# Patient Record
Sex: Male | Born: 1945
Health system: Southern US, Community
[De-identification: ages and names within clinical notes are randomized; demographics above are authoritative.]

## PROBLEM LIST (undated history)

## (undated) DIAGNOSIS — N200 Calculus of kidney: Secondary | ICD-10-CM

## (undated) DIAGNOSIS — M25539 Pain in unspecified wrist: Secondary | ICD-10-CM

## (undated) DIAGNOSIS — R972 Elevated prostate specific antigen [PSA]: Secondary | ICD-10-CM

## (undated) DIAGNOSIS — H269 Unspecified cataract: Secondary | ICD-10-CM

## (undated) DIAGNOSIS — I499 Cardiac arrhythmia, unspecified: Secondary | ICD-10-CM

## (undated) DIAGNOSIS — G4733 Obstructive sleep apnea (adult) (pediatric): Secondary | ICD-10-CM

## (undated) DIAGNOSIS — C61 Malignant neoplasm of prostate: Secondary | ICD-10-CM

## (undated) DIAGNOSIS — M25569 Pain in unspecified knee: Secondary | ICD-10-CM

## (undated) DIAGNOSIS — T7840XA Allergy, unspecified, initial encounter: Secondary | ICD-10-CM

## (undated) DIAGNOSIS — M109 Gout, unspecified: Secondary | ICD-10-CM

## (undated) DIAGNOSIS — Z9989 Dependence on other enabling machines and devices: Secondary | ICD-10-CM

## (undated) DIAGNOSIS — G4730 Sleep apnea, unspecified: Secondary | ICD-10-CM

## (undated) DIAGNOSIS — R634 Abnormal weight loss: Secondary | ICD-10-CM

## (undated) DIAGNOSIS — I491 Atrial premature depolarization: Secondary | ICD-10-CM

## (undated) DIAGNOSIS — N529 Male erectile dysfunction, unspecified: Secondary | ICD-10-CM

## (undated) DIAGNOSIS — K219 Gastro-esophageal reflux disease without esophagitis: Secondary | ICD-10-CM

## (undated) DIAGNOSIS — M1990 Unspecified osteoarthritis, unspecified site: Secondary | ICD-10-CM

## (undated) DIAGNOSIS — R6889 Other general symptoms and signs: Secondary | ICD-10-CM

## (undated) DIAGNOSIS — I10 Essential (primary) hypertension: Secondary | ICD-10-CM

## (undated) DIAGNOSIS — T8859XA Other complications of anesthesia, initial encounter: Secondary | ICD-10-CM

## (undated) DIAGNOSIS — T4145XA Adverse effect of unspecified anesthetic, initial encounter: Secondary | ICD-10-CM

## (undated) DIAGNOSIS — E785 Hyperlipidemia, unspecified: Secondary | ICD-10-CM

## (undated) DIAGNOSIS — C801 Malignant (primary) neoplasm, unspecified: Secondary | ICD-10-CM

## (undated) DIAGNOSIS — R918 Other nonspecific abnormal finding of lung field: Secondary | ICD-10-CM

## (undated) HISTORY — DX: Unspecified osteoarthritis, unspecified site: M19.90

## (undated) HISTORY — DX: Unspecified cataract: H26.9

## (undated) HISTORY — PX: CATARACT EXTRACTION, BILATERAL: SHX1313

## (undated) HISTORY — DX: Pain in unspecified knee: M25.569

## (undated) HISTORY — DX: Dependence on other enabling machines and devices: Z99.89

## (undated) HISTORY — DX: Essential (primary) hypertension: I10

## (undated) HISTORY — DX: Obstructive sleep apnea (adult) (pediatric): G47.33

## (undated) HISTORY — PX: MOHS SURGERY: SUR867

## (undated) HISTORY — PX: LIPOMA EXCISION: SHX5283

## (undated) HISTORY — PX: TONSILLECTOMY: SUR1361

## (undated) HISTORY — DX: Sleep apnea, unspecified: G47.30

## (undated) HISTORY — DX: Allergy, unspecified, initial encounter: T78.40XA

## (undated) HISTORY — DX: Atrial premature depolarization: I49.1

## (undated) HISTORY — DX: Hyperlipidemia, unspecified: E78.5

## (undated) HISTORY — DX: Gout, unspecified: M10.9

## (undated) HISTORY — PX: PROSTATE BIOPSY: SHX241

## (undated) HISTORY — DX: Male erectile dysfunction, unspecified: N52.9

---
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
## 1999-02-06 ENCOUNTER — Encounter: Payer: Self-pay | Admitting: Emergency Medicine

## 1999-02-06 ENCOUNTER — Emergency Department (HOSPITAL_COMMUNITY): Admission: EM | Admit: 1999-02-06 | Discharge: 1999-02-06 | Payer: Self-pay | Admitting: Emergency Medicine

## 2010-11-06 ENCOUNTER — Emergency Department (HOSPITAL_COMMUNITY): Admission: EM | Admit: 2010-11-06 | Discharge: 2010-11-06 | Payer: Self-pay | Admitting: Emergency Medicine

## 2010-11-16 ENCOUNTER — Ambulatory Visit (HOSPITAL_COMMUNITY): Admission: RE | Admit: 2010-11-16 | Discharge: 2010-11-16 | Payer: Self-pay | Admitting: Urology

## 2011-03-09 LAB — POCT I-STAT, CHEM 8
BUN: 14 mg/dL (ref 6–23)
Calcium, Ion: 1.16 mmol/L (ref 1.12–1.32)
Chloride: 105 mEq/L (ref 96–112)
Creatinine, Ser: 1.6 mg/dL — ABNORMAL HIGH (ref 0.4–1.5)
Glucose, Bld: 108 mg/dL — ABNORMAL HIGH (ref 70–99)
HCT: 47 % (ref 39.0–52.0)
Hemoglobin: 16 g/dL (ref 13.0–17.0)
Potassium: 4.1 mEq/L (ref 3.5–5.1)
Sodium: 138 mEq/L (ref 135–145)
TCO2: 23 mmol/L (ref 0–100)

## 2011-03-09 LAB — URINALYSIS, ROUTINE W REFLEX MICROSCOPIC
Bilirubin Urine: NEGATIVE
Glucose, UA: NEGATIVE mg/dL
Ketones, ur: NEGATIVE mg/dL
Leukocytes, UA: NEGATIVE
Nitrite: NEGATIVE
Protein, ur: NEGATIVE mg/dL
Specific Gravity, Urine: 1.017 (ref 1.005–1.030)
Urobilinogen, UA: 0.2 mg/dL (ref 0.0–1.0)
pH: 6 (ref 5.0–8.0)

## 2011-03-09 LAB — URINE CULTURE: Culture: NO GROWTH

## 2011-03-09 LAB — URINE MICROSCOPIC-ADD ON

## 2011-10-08 ENCOUNTER — Other Ambulatory Visit: Payer: Self-pay | Admitting: Internal Medicine

## 2011-10-08 DIAGNOSIS — R911 Solitary pulmonary nodule: Secondary | ICD-10-CM

## 2011-10-15 ENCOUNTER — Ambulatory Visit
Admission: RE | Admit: 2011-10-15 | Discharge: 2011-10-15 | Disposition: A | Discharge status: Home / Self Care | Payer: BC Managed Care – PPO | Source: Ambulatory Visit | Attending: Internal Medicine | Admitting: Internal Medicine

## 2011-10-15 DIAGNOSIS — R911 Solitary pulmonary nodule: Secondary | ICD-10-CM

## 2011-10-15 MED ORDER — IOHEXOL 300 MG/ML  SOLN
75.0000 mL | Freq: Once | INTRAMUSCULAR | Status: AC | PRN
  Administered 2011-10-15: 75 mL via INTRAVENOUS

## 2012-04-03 ENCOUNTER — Encounter: Payer: Self-pay | Admitting: *Deleted

## 2012-04-03 DIAGNOSIS — R0789 Other chest pain: Secondary | ICD-10-CM | POA: Insufficient documentation

## 2014-03-22 ENCOUNTER — Encounter: Payer: Self-pay | Admitting: Cardiology

## 2014-04-05 ENCOUNTER — Encounter (INDEPENDENT_AMBULATORY_CARE_PROVIDER_SITE_OTHER): Payer: Self-pay | Admitting: General Surgery

## 2014-04-05 ENCOUNTER — Ambulatory Visit (INDEPENDENT_AMBULATORY_CARE_PROVIDER_SITE_OTHER): Payer: Medicare Other | Admitting: General Surgery

## 2014-04-05 VITALS — BP 160/82 | HR 84 | Temp 97.5°F | Ht 68.0 in | Wt 164.6 lb

## 2014-04-05 DIAGNOSIS — D1779 Benign lipomatous neoplasm of other sites: Secondary | ICD-10-CM

## 2014-04-05 DIAGNOSIS — D171 Benign lipomatous neoplasm of skin and subcutaneous tissue of trunk: Secondary | ICD-10-CM | POA: Insufficient documentation

## 2014-04-05 DIAGNOSIS — L723 Sebaceous cyst: Secondary | ICD-10-CM | POA: Insufficient documentation

## 2014-04-05 NOTE — Addendum Note (Signed)
Addended by: Jeanann Lewandowsky on: 04/05/2014 02:55 PM   Modules accepted: Orders

## 2014-04-05 NOTE — Progress Notes (Signed)
Patient ID: John Potts, male   DOB: April 15, 1946, 68 y.o.   MRN: [id number]  Chief Complaint  Patient presents with  . Lipoma    Note: This dictation was prepared with Dragon/digital dictation along with [employer]. Any transcriptional errors that result from this process are unintentional.  HPI RONDALE Potts is a 68 y.o. male.  He is referred by Dr. Janie Morning at [facility] for evaluation of a soft tissue mass from the right lateral chest wall and the lower back  The patient states that he has had both of these soft tissue masses for 10 years. They have both been enlarging. One of these areas is now as  big as a baseball and is just below the right axilla in the posterior axillary line. A burning discomfort there intermittently. Smaller mass, enlarging, in the lower midline of the back of the lumbar region has never been infected but he would like both of these areas excised.  Morbidities include gout, hyperlipidemia, hypertension, obstructive sleep apnea and asthma.  HPI  Past Medical History  Diagnosis Date  . Hypertension   . Hyperlipidemia   . Chest pain, atypical 05/11/2001    normal stress cardiolite EF 64%    History reviewed. No pertinent past surgical history.  Family History  Problem Relation Age of Onset  . Hypertension Mother   . Hypertension Father     Social History History  Substance Use Topics  . Smoking status: Former Smoker    Quit date: 12/28/2007  . Smokeless tobacco: Not on file  . Alcohol Use: No    No Known Allergies  Current Outpatient Prescriptions  Medication Sig Dispense Refill  . allopurinol (ZYLOPRIM) 300 MG tablet Take 300 mg by mouth daily.      . fenofibrate (TRICOR) 145 MG tablet Take 145 mg by mouth daily.      Marland Kitchen losartan (COZAAR) 100 MG tablet Take 100 mg by mouth daily.      . mometasone (NASONEX) 50 MCG/ACT nasal spray Place 2 sprays into the nose daily.      . Nutritional Supplements (MELATONIN PO)  Take by mouth at bedtime as needed.      . pravastatin (PRAVACHOL) 20 MG tablet Take 20 mg by mouth daily.      . propranolol (INDERAL) 10 MG tablet Take 10 mg by mouth as needed.      . Tamsulosin HCl (FLOMAX) 0.4 MG CAPS Take by mouth daily.      Marland Kitchen loratadine (CLARITIN) 10 MG tablet Take 10 mg by mouth daily.       No current facility-administered medications for this visit.    Review of Systems Review of Systems  Constitutional: Negative for fever, chills and unexpected weight change.  HENT: Negative for congestion, hearing loss, sore throat, trouble swallowing and voice change.   Eyes: Negative for visual disturbance.  Respiratory: Negative for cough and wheezing.   Cardiovascular: Negative for chest pain, palpitations and leg swelling.  Gastrointestinal: Negative for nausea, vomiting, abdominal pain, diarrhea, constipation, blood in stool, abdominal distention, anal bleeding and rectal pain.  Genitourinary: Negative for hematuria and difficulty urinating.  Musculoskeletal: Negative for arthralgias.  Skin: Negative for rash and wound.  Neurological: Negative for seizures, syncope, weakness and headaches.  Hematological: Negative for adenopathy. Does not bruise/bleed easily.  Psychiatric/Behavioral: Negative for confusion.    Blood pressure 160/82, pulse 84, temperature 97.5 F (36.4 C), temperature source Oral, height 5\' 8"  (1.727 m), weight 164 lb 9.6 oz (  74.662 kg).  Physical Exam Physical Exam  Constitutional: He is oriented to person, place, and time. He appears well-developed and well-nourished. No distress.  HENT:  Head: Normocephalic.  Nose: Nose normal.  Mouth/Throat: No oropharyngeal exudate.  Eyes: Conjunctivae and EOM are normal. Pupils are equal, round, and reactive to light. Right eye exhibits no discharge. Left eye exhibits no discharge. No scleral icterus.  Neck: Normal range of motion. Neck supple. No JVD present. No tracheal deviation present. No thyromegaly  present.  Cardiovascular: Normal rate, regular rhythm, normal heart sounds and intact distal pulses.   No murmur heard. Pulmonary/Chest: Effort normal and breath sounds normal. No stridor. No respiratory distress. He has no wheezes. He has no rales. He exhibits no tenderness.  8 cm soft tissue mass right upper back, posterior axillary line, and slightly more posterior. When he contracts his latissimus dorsi muscle this fixes the mass, strongly suggesting it that it is below the latissimus dorsi. The muscles relaxes easily palpable and ballotable. No overlying skin change.very soft. Classic texture for lipoma. There is a 3 cm chronic sebaceous cyst of the lower midline of the back in the lumbar region. Not acutely inflamed.  Abdominal: Soft. Bowel sounds are normal. He exhibits no distension and no mass. There is no tenderness. There is no rebound and no guarding.  Musculoskeletal: Normal range of motion. He exhibits no edema and no tenderness.  Lymphadenopathy:    He has no cervical adenopathy.  Neurological: He is alert and oriented to person, place, and time. He has normal reflexes. Coordination normal.  Skin: Skin is warm and dry. No rash noted. He is not diaphoretic. No erythema. No pallor.  Psychiatric: He has a normal mood and affect. His behavior is normal. Judgment and thought content normal.    Data Reviewed Office visits from [facility].  Assessment    8 cm soft tissue mass right upper back, submuscular, below latissimus dorsi muscle. Strong suspect benign lipomaBecoming symptomatic with neuritis like symptoms.  3 cm soft tissue mass, midline back, lumbar region. Suspect chronic epidermoid cyst  Obstructive sleep apnea next  Hypertension  Hyperlipidemia  Gout     Plan    Since these have been enlarging, it is reasonable to have these excised, as the patient desires.  He will be scheduled for excision of both of these lesions under generral anesthesia in  a lateral position as an outpatient in the near future/.  I discussed the indications, details, techniques, and numerous risk of the surgery were with him. He is aware of the risk of bleeding, infection, particularly the risk of nerve damage with chronic pain or muscle weakness, skin necrosis, and other unforeseen problems. He understands these issues well. This tunnel his questions were answered. He agrees with this plan.        Edsel Petrin. Dalbert Batman, M.D., [facility] General and Minimally invasive Surgery Breast and Colorectal Surgery Office:   [phone] Pager:   [phone]  04/05/2014, 2:46 PM

## 2014-04-05 NOTE — Patient Instructions (Signed)
The soft tissue mass on your right lateral chest wall just below the armpit is probably a benign, large, 8 cm lipoma below the latissimus dorsi muscle. Since this has been progressively enlarging it is a good idea to have it removed.  The 3 cm mass omn your  lower back is most likely some type of epidermoid cyst. It is not infected at this time but since it has been enlarging , it is reasonable to remove that at the same time, as you request.  You'll be scheduled for removal of both of these soft tissue masses under general anesthesia as an outpatient in the near future.

## 2014-04-29 ENCOUNTER — Encounter (HOSPITAL_BASED_OUTPATIENT_CLINIC_OR_DEPARTMENT_OTHER): Payer: Self-pay | Admitting: Emergency Medicine

## 2014-04-29 ENCOUNTER — Other Ambulatory Visit (INDEPENDENT_AMBULATORY_CARE_PROVIDER_SITE_OTHER): Payer: Self-pay | Admitting: General Surgery

## 2014-04-29 ENCOUNTER — Telehealth (INDEPENDENT_AMBULATORY_CARE_PROVIDER_SITE_OTHER): Payer: Self-pay | Admitting: Surgery

## 2014-04-29 ENCOUNTER — Other Ambulatory Visit (INDEPENDENT_AMBULATORY_CARE_PROVIDER_SITE_OTHER): Payer: Self-pay

## 2014-04-29 ENCOUNTER — Emergency Department (HOSPITAL_BASED_OUTPATIENT_CLINIC_OR_DEPARTMENT_OTHER)
Admission: EM | Admit: 2014-04-29 | Discharge: 2014-04-29 | Disposition: A | Discharge status: Home / Self Care | Payer: Medicare Other | Attending: Emergency Medicine | Admitting: Emergency Medicine

## 2014-04-29 DIAGNOSIS — I10 Essential (primary) hypertension: Secondary | ICD-10-CM | POA: Insufficient documentation

## 2014-04-29 DIAGNOSIS — Z87448 Personal history of other diseases of urinary system: Secondary | ICD-10-CM | POA: Insufficient documentation

## 2014-04-29 DIAGNOSIS — IMO0002 Reserved for concepts with insufficient information to code with codable children: Secondary | ICD-10-CM | POA: Insufficient documentation

## 2014-04-29 DIAGNOSIS — R109 Unspecified abdominal pain: Secondary | ICD-10-CM | POA: Insufficient documentation

## 2014-04-29 DIAGNOSIS — R142 Eructation: Secondary | ICD-10-CM

## 2014-04-29 DIAGNOSIS — Z87891 Personal history of nicotine dependence: Secondary | ICD-10-CM | POA: Insufficient documentation

## 2014-04-29 DIAGNOSIS — N9989 Other postprocedural complications and disorders of genitourinary system: Secondary | ICD-10-CM | POA: Insufficient documentation

## 2014-04-29 DIAGNOSIS — E785 Hyperlipidemia, unspecified: Secondary | ICD-10-CM | POA: Insufficient documentation

## 2014-04-29 DIAGNOSIS — Y838 Other surgical procedures as the cause of abnormal reaction of the patient, or of later complication, without mention of misadventure at the time of the procedure: Secondary | ICD-10-CM | POA: Insufficient documentation

## 2014-04-29 DIAGNOSIS — R339 Retention of urine, unspecified: Secondary | ICD-10-CM

## 2014-04-29 DIAGNOSIS — R141 Gas pain: Secondary | ICD-10-CM | POA: Insufficient documentation

## 2014-04-29 DIAGNOSIS — D1739 Benign lipomatous neoplasm of skin and subcutaneous tissue of other sites: Secondary | ICD-10-CM

## 2014-04-29 DIAGNOSIS — Z79899 Other long term (current) drug therapy: Secondary | ICD-10-CM | POA: Insufficient documentation

## 2014-04-29 DIAGNOSIS — R143 Flatulence: Secondary | ICD-10-CM

## 2014-04-29 DIAGNOSIS — Z87442 Personal history of urinary calculi: Secondary | ICD-10-CM | POA: Insufficient documentation

## 2014-04-29 HISTORY — DX: Calculus of kidney: N20.0

## 2014-04-29 LAB — URINALYSIS, ROUTINE W REFLEX MICROSCOPIC
BILIRUBIN URINE: NEGATIVE
Glucose, UA: NEGATIVE mg/dL
Hgb urine dipstick: NEGATIVE
KETONES UR: NEGATIVE mg/dL
NITRITE: NEGATIVE
PROTEIN: NEGATIVE mg/dL
Specific Gravity, Urine: 1.013 (ref 1.005–1.030)
Urobilinogen, UA: 0.2 mg/dL (ref 0.0–1.0)
pH: 6 (ref 5.0–8.0)

## 2014-04-29 LAB — URINE MICROSCOPIC-ADD ON

## 2014-04-29 MED ORDER — OXYCODONE-ACETAMINOPHEN 7.5-325 MG PO TABS: 1.0000 | ORAL_TABLET | ORAL | Status: AC | PRN

## 2014-04-29 NOTE — Telephone Encounter (Signed)
Pt called and has not urinated since surgery this am.  He has not taken his flomax today and I recommended that he take it.  If not able to void,  Recommend he go to ER.

## 2014-04-29 NOTE — ED Notes (Signed)
Pt unable to void post surgical procedure

## 2014-04-29 NOTE — Discharge Instructions (Signed)
Keep your appointment with the urologist in 2 days. Keep your Foley Catheter in place until the Urologist removes it.  Call for a follow up appointment with a Family or Primary Care Provider.  Return if Symptoms worsen.   Take medication as prescribed.

## 2014-04-29 NOTE — ED Provider Notes (Signed)
CSN: [id number]     Arrival date & time 04/29/14  1950 History   First MD Initiated Contact with Patient 04/29/14 2138     Chief Complaint  Patient presents with  . Dysuria     (Consider location/radiation/quality/duration/timing/severity/associated sxs/prior Treatment) HPI Comments: Patient is a 68 year-old L. presenting to the emergency department with a chief complaint of decreased urine for one day. The patient reports she had a back procedure performed by Dr. Dalbert Batman, today and has been unable to completely empty his bladder.   Patient is a 68 y.o. male presenting with dysuria. The history is provided by the patient and medical records. No language interpreter was used.  Dysuria Associated symptoms include abdominal pain. Pertinent negatives include no chills, fever, nausea or numbness.    Past Medical History  Diagnosis Date  . Hypertension   . Hyperlipidemia   . Chest pain, atypical 05/11/2001    normal stress cardiolite EF 64%  . Prostate disease     f/u with urologist this coming wednesday  . Kidney stone    Past Surgical History  Procedure Laterality Date  . Lipoma excision     Family History  Problem Relation Age of Onset  . Hypertension Mother   . Hypertension Father    History  Substance Use Topics  . Smoking status: Former Smoker    Quit date: 12/28/2007  . Smokeless tobacco: Not on file  . Alcohol Use: No    Review of Systems  Constitutional: Negative for fever and chills.  Gastrointestinal: Positive for abdominal pain and abdominal distention. Negative for nausea.  Genitourinary: Positive for difficulty urinating. Negative for hematuria, flank pain, penile swelling and scrotal swelling.  Musculoskeletal: Negative for gait problem.  Neurological: Negative for light-headedness and numbness.      Allergies  Review of patient's allergies indicates no known allergies.  Home Medications   Prior to Admission medications   Medication Sig Start Date  End Date Taking? Authorizing Provider  allopurinol (ZYLOPRIM) 300 MG tablet Take 300 mg by mouth daily.   Yes Historical Provider, MD  loratadine (CLARITIN) 10 MG tablet Take 10 mg by mouth daily.   Yes Historical Provider, MD  losartan (COZAAR) 100 MG tablet Take 100 mg by mouth daily.   Yes Historical Provider, MD  oxyCODONE-acetaminophen (PERCOCET) 7.5-325 MG per tablet Take 1 tablet by mouth every 4 (four) hours as needed for pain. 04/29/14 04/29/15 Yes Adin Hector, MD  pravastatin (PRAVACHOL) 20 MG tablet Take 20 mg by mouth daily.   Yes Historical Provider, MD  Tamsulosin HCl (FLOMAX) 0.4 MG CAPS Take by mouth daily.   Yes Historical Provider, MD  mometasone (NASONEX) 50 MCG/ACT nasal spray Place 2 sprays into the nose daily.    Historical Provider, MD  Nutritional Supplements (MELATONIN PO) Take by mouth at bedtime as needed.    Historical Provider, MD  tadalafil (CIALIS) 5 MG tablet Take 2.5 mg by mouth daily as needed for erectile dysfunction.    Historical Provider, MD   BP 170/82  Pulse 105  Temp(Src) 98.3 F (36.8 C) (Oral)  Resp 20  Ht 5\' 8"  (1.727 m)  Wt 163 lb (73.936 kg)  BMI 24.79 kg/m2  SpO2 97% Physical Exam  Nursing note and vitals reviewed. Constitutional: He is oriented to person, place, and time. He appears well-developed and well-nourished. No distress.  HENT:  Head: Normocephalic and atraumatic.  Eyes: EOM are normal. Pupils are equal, round, and reactive to light.  Neck: Normal range of  motion. Neck supple.  Pulmonary/Chest: Effort normal. No respiratory distress.  Abdominal: He exhibits distension. He exhibits no mass. There is tenderness in the suprapubic area.  Musculoskeletal: Normal range of motion.  Neurological: He is oriented to person, place, and time.  Normal gait.  Skin: Skin is warm and dry.  Psychiatric: He has a normal mood and affect. His behavior is normal.    ED Course  Procedures (including critical care time) Labs Review Labs  Reviewed  URINALYSIS, ROUTINE W REFLEX MICROSCOPIC - Abnormal; Notable for the following:    Leukocytes, UA SMALL (*)    All other components within normal limits  URINE MICROSCOPIC-ADD ON    Imaging Review No results found.   EKG Interpretation None      MDM   Final diagnoses:  Urinary retention   Patient with postoperative urinary retention, bladder scan showed approximately 730 mL. Foley placed 700 mL of clear urine obtained. UA without sign of infection. Patient guarded has a followup appointment with urology on Wednesday. Will discharge with Foley and leg bag. Dr. Rogene Houston also evaluate the patient in ED.  Lorrine Kin, PA-C 05/01/14 1314

## 2014-04-29 NOTE — ED Notes (Signed)
Pt had back surgery this am  Has not been able to urinate but about 30 cc since then  Call his md and was told to take flomax about 1800,  No relief . If no relief come to er

## 2014-04-29 NOTE — ED Provider Notes (Signed)
Medical screening examination/treatment/procedure(s) were conducted as a shared visit with non-physician practitioner(s) and myself.  I personally evaluated the patient during the encounter.   Patient seen by me. Patient with urinary retention significant relief with placement of a Foley catheter and leg bag. Patient is followed by urology routinely already scheduled for Wednesday to be perfect timing for reevaluation and probable removal of the catheter. Patient is status post surgical procedure unable to void post surgery. No other lower leg focal deficits.   EKG Interpretation None      Results for orders placed during the hospital encounter of 04/29/14  URINALYSIS, ROUTINE W REFLEX MICROSCOPIC      Result Value Ref Range   Color, Urine YELLOW  YELLOW   APPearance CLEAR  CLEAR   Specific Gravity, Urine 1.013  1.005 - 1.030   pH 6.0  5.0 - 8.0   Glucose, UA NEGATIVE  NEGATIVE mg/dL   Hgb urine dipstick NEGATIVE  NEGATIVE   Bilirubin Urine NEGATIVE  NEGATIVE   Ketones, ur NEGATIVE  NEGATIVE mg/dL   Protein, ur NEGATIVE  NEGATIVE mg/dL   Urobilinogen, UA 0.2  0.0 - 1.0 mg/dL   Nitrite NEGATIVE  NEGATIVE   Leukocytes, UA SMALL (*) NEGATIVE  URINE MICROSCOPIC-ADD ON      Result Value Ref Range   Squamous Epithelial / LPF RARE  RARE   WBC, UA 3-6  <3 WBC/hpf   Bacteria, UA RARE  RARE     Mervin Kung, MD 04/29/14 2244

## 2014-05-01 NOTE — Progress Notes (Signed)
Quick Note:  Inform patient of Pathology report,. Benign lipomas.  hmi ______

## 2014-05-02 ENCOUNTER — Telehealth (INDEPENDENT_AMBULATORY_CARE_PROVIDER_SITE_OTHER): Payer: Self-pay

## 2014-05-02 NOTE — Telephone Encounter (Signed)
LMOM for pt to call. Per Dr Dalbert Batman pt can be advised path was benign lipomas.

## 2014-05-02 NOTE — Telephone Encounter (Signed)
Pt returned call and was given the pathology.

## 2015-02-27 ENCOUNTER — Other Ambulatory Visit: Payer: Self-pay | Admitting: Gastroenterology

## 2015-07-15 ENCOUNTER — Institutional Professional Consult (permissible substitution): Payer: Medicare Other | Admitting: Neurology

## 2015-07-21 ENCOUNTER — Ambulatory Visit (INDEPENDENT_AMBULATORY_CARE_PROVIDER_SITE_OTHER): Payer: Medicare Other | Admitting: Neurology

## 2015-07-21 ENCOUNTER — Encounter: Payer: Self-pay | Admitting: Neurology

## 2015-07-21 VITALS — BP 148/82 | HR 88 | Resp 20 | Ht 68.0 in | Wt 174.0 lb

## 2015-07-21 DIAGNOSIS — Z9989 Dependence on other enabling machines and devices: Principal | ICD-10-CM

## 2015-07-21 DIAGNOSIS — R5382 Chronic fatigue, unspecified: Secondary | ICD-10-CM | POA: Diagnosis not present

## 2015-07-21 DIAGNOSIS — G4733 Obstructive sleep apnea (adult) (pediatric): Secondary | ICD-10-CM

## 2015-07-21 DIAGNOSIS — M2619 Other specified anomalies of jaw-cranial base relationship: Secondary | ICD-10-CM | POA: Diagnosis not present

## 2015-07-21 NOTE — Patient Instructions (Signed)
Sleep Apnea  Sleep apnea is disorder that affects a person's sleep. A person with sleep apnea has abnormal pauses in their breathing when they sleep. It is hard for them to get a good sleep. This makes a person tired during the day. It also can lead to other physical problems. There are three types of sleep apnea. One type is when breathing stops for a short time because your airway is blocked (obstructive sleep apnea). Another type is when the brain sometimes fails to give the normal signal to breathe to the muscles that control your breathing (central sleep apnea). The third type is a combination of the other two types.  HOME CARE  · Do not sleep on your back. Try to sleep on your side.  · Take all medicine as told by your doctor.  · Avoid alcohol, calming medicines (sedatives), and depressant drugs.  · Try to lose weight if you are overweight. Talk to your doctor about a healthy weight goal.  Your doctor may have you use a device that helps to open your airway. It can help you get the air that you need. It is called a positive airway pressure (PAP) device. There are three types of PAP devices:  · Continuous positive airway pressure (CPAP) device.  · Nasal expiratory positive airway pressure (EPAP) device.  · Bilevel positive airway pressure (BPAP) device.  MAKE SURE YOU:  · Understand these instructions.  · Will watch your condition.  · Will get help right away if you are not doing well or get worse.  Document Released: 09/21/2008 Document Revised: 11/29/2012 Document Reviewed: 04/15/2012  ExitCare® Patient Information ©2015 [employer]. This information is not intended to replace advice given to you by your health care provider. Make sure you discuss any questions you have with your health care provider.

## 2015-07-21 NOTE — Progress Notes (Signed)
**Note John-Identified via Obfuscation** SLEEP MED[facility]INE CLINIC   Provider:  Larey Seat, M D  Referring Provider: Leanna Battles, MD Primary Care Physician:  Donnajean Lopes, MD  Chief Complaint  Patient presents with  . sleep consult    needs a follow up for cpap, hasn't been seen here 2009, rm 11, alone    HPI:  John Potts is a 69 y.o. male ,  seen here as a referral  from Dr. Philip Aspen for a sleep consultation,  I see John Potts today for the first time after about 7 years and he was referred originally in early 2009 for evaluation of possible sleep apnea he was diagnosed with sleep apnea and titrated to CPAP 9 cm water and has continued to use the machine faithfully at that setting. His titration dated from 08-10-2008. John Potts now reports that he feels tired and fatigued all the time not necessarily sleepy but that there is a significant decrease in his energy. His baseline study he had an apnea-hypotony index of 25.4 at the body mass index of 26 and a neck circumference of 15.25 inches. He had endorsed the Epworth sleepiness score in 2009 at only 2 points.   John Potts retired in 2013, he goes to bed at 9.30 PM.  Usually falls asleep promptly. He likes to watch some TV, usually not in bed. His Dog, John Potts,  wakes him at 6 AM.  He has no nocturia , no witnessed  Snoring on CPAP. He reports to dream, but not to act out, is no report of sleepwalking, sleep talking, sleep eating. He sometimes a takes a nap in PM , and his wife noted him to snore. He does consume some coffee but only in the morning. 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 He feels fatigued and non restored a couple of mornings, these afternoons are the nap times. He sleeps not longer than 45 minutes, he feels not refreshed  if he naps without CPAP.   No history of cervical, ENT or TBI    Review of Systems: Out of a complete 14 system review, the patient complains of only the following  symptoms, and all other reviewed systems are negative.   Epworth score 3 , Fatigue severity score 26  , depression score 2   History   Social History  . Marital Status: Married    Spouse Name: N/A  . Number of Children: N/A  . Years of Education: N/A   Occupational History  . retired    Social History Main Topics  . Smoking status: Current Every Day Smoker  . Smokeless tobacco: Not on file  . Alcohol Use: 4.2 oz/week    7 Standard drinks or equivalent per week  . Drug Use: No  . Sexual Activity: Not on file   Other Topics Concern  . Not on file   Social History Narrative   Drinks 2 cups of coffee in the morning.    Family History  Problem Relation Age of Onset  . Hypertension Mother   . Hypertension Father     Past Medical History  Diagnosis Date  . Hyperlipidemia   . Kidney stone   . Gout   . OSA on CPAP   . Erectile dysfunction   . Knee pain     Past Surgical History  Procedure Laterality Date  . Lipoma excision    . Cataract extraction, bilateral    . Mohs  surgery      Current Outpatient Prescriptions  Medication Sig Dispense Refill  . allopurinol (ZYLOPRIM) 300 MG tablet Take 300 mg by mouth daily.    Marland Kitchen aspirin 81 MG tablet Take 81 mg by mouth daily.    Marland Kitchen ipratropium (ATROVENT) 0.06 % nasal spray Place 2 sprays into both nostrils 4 (four) times daily.    Marland Kitchen loratadine (CLARITIN) 10 MG tablet Take 10 mg by mouth daily.    Marland Kitchen losartan (COZAAR) 100 MG tablet Take 100 mg by mouth daily.    . Melatonin 3 MG TABS Take 3 mg by mouth daily.    . mometasone (NASONEX) 50 MCG/ACT nasal spray Place 2 sprays into the nose daily.    . Nutritional Supplements (MELATONIN PO) Take by mouth at bedtime as needed.    . pravastatin (PRAVACHOL) 20 MG tablet Take 20 mg by mouth daily.    Marland Kitchen scopolamine (TRANSDERM-SCOP) 1 MG/3DAYS Place 1 patch onto the skin every 3 (three) days as needed.    . sildenafil (VIAGRA) 100 MG tablet Take 100 mg by mouth as needed for erectile  dysfunction.    . Tamsulosin HCl (FLOMAX) 0.4 MG CAPS Take by mouth daily.     No current facility-administered medications for this visit.    Allergies as of 07/21/2015  . (No Known Allergies)    Vitals: BP 148/82 mmHg  Pulse 88  Resp 20  Ht 5\' 8"  (1.727 m)  Wt 174 lb (78.926 kg)  BMI 26.46 kg/m2 Last Weight:  Wt Readings from Last 1 Encounters:  07/21/15 174 lb (78.926 kg)       Last Height:   Ht Readings from Last 1 Encounters:  07/21/15 5\' 8"  (1.727 m)    Physical exam:  General: The patient is awake, alert and appears not in acute distress. The patient is well groomed. Head: Normocephalic, atraumatic. Neck is supple. Mallampati2 , left deviated uvula, ,  neck circumference: 16 . Nasal airflow unrestricted- but he reports post nasal drip at night.  , TMJ is not  evident . Retrognathia is seen.  Cardiovascular:  Regular rate and rhythm , without  murmurs or carotid bruit, and without distended neck veins. Respiratory: Lungs are clear to auscultation. Skin:  Without evidence of edema, or rash Trunk: BMI is not  elevated and patient  has normal posture.  Neurologic exam : The patient is awake and alert, oriented to place and time.   Memory subjective  described as intact. There is a normal attention span & concentration ability. Speech is fluent without  dysarthria, dysphonia or aphasia. Mood and affect are appropriate.  Cranial nerves: Pupils are equal and briskly reactive to light. Funduscopic exam without  evidence of pallor , status post cataract surgery.  Extraocular movements  in vertical and horizontal planes intact and without nystagmus. Visual fields by finger perimetry are intact. Hearing to finger rub intact.  Facial sensation intact to fine touch. Facial motor strength is symmetric and tongue and uvula move midline.  Motor exam: Normal tone ,muscle bulk and symmetric ,strength in all extremities.  Sensory:  Fine touch, pinprick and vibration were tested in  all extremities. Proprioception is normal.  Coordination: Rapid alternating movements in the fingers/hands is normal.  Finger-to-nose maneuver  normal without evidence of ataxia, dysmetria or tremor.  Gait and station: Patient walks without assistive device and is able unassisted to climb up to the exam table.  Strength within normal limits.   Deep tendon reflexes: in the  upper  and lower extremities are symmetric and intact. Babinski maneuver response is downgoing.   Assessment:  After physical and neurologic examination, review of laboratory studies, imaging, neurophysiology testing and pre-existing records, assessment is   I was able to obtain a download from John Potts's CPAP machine. His download documents some significant air leaks and since he has retrognathia he should probably not use a full face mask but a nasal pillow or nasal mask. In addition his apnea index is still fairly well-controlled it is 6.6 and may contribute to his daytime fatigue. His median daily usage of 7 hours 22 minutes and he had 100% compliance. Septal machine pressure is 9 cm water with an EPR of 3. He is a habitual mouth breather and has also issues with rhinitis-sinusitis and for this reason has always preferred a full face mask.   I would like to see the patient for a re-titration, and for a mask re fit- FFM , if needed, but he may tolerate the newer nasal masks, he sleeps with a mouth  piece for bruxism.   HST for screening, followed by titration of needed.     The patient was advised of the nature of the diagnosed sleep disorder , the treatment options and risks for general a health and wellness arising from not treating the condition. Visit duration was 30* minutes.   Plan:  Treatment plan and additional workup :  Retest for apnea, baseline to be established.    Asencion Partridge Madox Corkins MD  07/21/2015

## 2015-08-11 ENCOUNTER — Encounter (INDEPENDENT_AMBULATORY_CARE_PROVIDER_SITE_OTHER): Payer: Medicare Other

## 2015-08-11 DIAGNOSIS — G4733 Obstructive sleep apnea (adult) (pediatric): Secondary | ICD-10-CM | POA: Diagnosis not present

## 2015-08-11 DIAGNOSIS — M2619 Other specified anomalies of jaw-cranial base relationship: Secondary | ICD-10-CM

## 2015-08-11 DIAGNOSIS — Z9989 Dependence on other enabling machines and devices: Principal | ICD-10-CM

## 2015-08-11 DIAGNOSIS — R5382 Chronic fatigue, unspecified: Secondary | ICD-10-CM

## 2015-08-18 ENCOUNTER — Telehealth: Payer: Self-pay

## 2015-08-18 DIAGNOSIS — G4733 Obstructive sleep apnea (adult) (pediatric): Secondary | ICD-10-CM

## 2015-08-18 DIAGNOSIS — R0902 Hypoxemia: Secondary | ICD-10-CM

## 2015-08-18 NOTE — Telephone Encounter (Signed)
Spoke to pt regarding sleep study results. Advised pt that mild osa was found and associated hypoxemia. Pap therapy is indicated. Dr. Brett Fairy recommended to proceed with an ONO while on auto cpap 5-15 cm water. Pt wishes to use [facility]. Pt states that he owns a cpap already but it is about 68 years old and he doesn't use a DME at this time. Made a f/u appt for insurance purposes on 10/5 at 3:00. Pt verbalized understanding to bring cpap and arrive 15 minutes early.

## 2015-09-29 ENCOUNTER — Encounter: Payer: Self-pay | Admitting: Neurology

## 2015-09-29 ENCOUNTER — Telehealth: Payer: Self-pay | Admitting: Neurology

## 2015-09-29 ENCOUNTER — Ambulatory Visit (INDEPENDENT_AMBULATORY_CARE_PROVIDER_SITE_OTHER): Payer: Medicare Other | Admitting: Neurology

## 2015-09-29 VITALS — BP 146/68 | HR 86 | Resp 20 | Ht 68.0 in | Wt 169.0 lb

## 2015-09-29 DIAGNOSIS — J0121 Acute recurrent ethmoidal sinusitis: Secondary | ICD-10-CM

## 2015-09-29 DIAGNOSIS — J441 Chronic obstructive pulmonary disease with (acute) exacerbation: Secondary | ICD-10-CM | POA: Insufficient documentation

## 2015-09-29 DIAGNOSIS — J440 Chronic obstructive pulmonary disease with acute lower respiratory infection: Secondary | ICD-10-CM

## 2015-09-29 DIAGNOSIS — J322 Chronic ethmoidal sinusitis: Secondary | ICD-10-CM | POA: Insufficient documentation

## 2015-09-29 DIAGNOSIS — R0902 Hypoxemia: Secondary | ICD-10-CM | POA: Diagnosis not present

## 2015-09-29 DIAGNOSIS — Z9989 Dependence on other enabling machines and devices: Secondary | ICD-10-CM

## 2015-09-29 DIAGNOSIS — G4733 Obstructive sleep apnea (adult) (pediatric): Secondary | ICD-10-CM | POA: Diagnosis not present

## 2015-09-29 NOTE — Telephone Encounter (Signed)
Persistent hypoxemia while on CPAP. Mild obstructive sleep apnea diagnosed in home sleep test with an AHI of 12.5. Nadir was 81 with 331.8 minutes of desaturation. Stable cardiac rate.

## 2015-09-29 NOTE — Patient Instructions (Signed)
Allergic  Asthmatic Bronchitis Chronic asthmatic bronchitis is a complication of persistent asthma. After a period of time with asthma, some people develop airflow obstruction that is present all the time, even when not having an asthma attack.There is also persistent inflammation of the airways, and the bronchial tubes produce more mucus. Chronic asthmatic bronchitis usually is a permanent problem with the lungs. CAUSES  Chronic asthmatic bronchitis happens most often in people who have asthma and also smoke cigarettes. Occasionally, it can happen to a person with long-standing or severe asthma even if the person is not a smoker. SIGNS AND SYMPTOMS  Chronic asthmatic bronchitis usually causes symptoms of both asthma and chronic bronchitis, including:   Coughing.  Increased sputum production.  Wheezing and shortness of breath.  Chest discomfort.  Recurring infections. DIAGNOSIS  Your health care provider will take a medical history and perform a physical exam. Chronic asthmatic bronchitis is suspected when a person with asthma has abnormal results on breathing tests (pulmonary function tests) even when breathing symptoms are at their best. Other tests, such as a chest X-ray, may be performed to rule out other conditions.  TREATMENT  Treatment involves controlling symptoms with medicine and lifestyle changes.  Your health care provider may prescribe asthma medicines, including inhaler and nebulizer medicines.  Infection can be treated with medicine to kill germs (antibiotics). Serious infections may require hospitalization. These can include:  Pneumonia.  Sinus infections.  Acute bronchitis.   Preventing infection and hospitalization is very important. Get an influenza vaccination every year as directed by your health care provider. Ask your health care provider whether you need a pneumonia vaccine.  Ask your health care provider whether you would benefit from a pulmonary  rehabilitation program. HOME CARE INSTRUCTIONS  Take medicines only as directed by your health care provider.  If you are a cigarette smoker, the most important thing that you can do is quit. Talk to your health care provider for help with quitting smoking.  Avoid pollen, dust, animal dander, molds, smoke, and other things that cause attacks.  Regular exercise is very important to help you feel better. Discuss possible exercise routines with your health care provider.  If animal dander is the cause of asthma, you may not be able to keep pets.  It is important that you:  Become educated about your medical condition.  Participate in maintaining wellness.  Seek medical care as directed. Delay in seeking medical care could cause permanent injury and may be a risk to your life. SEEK MEDICAL CARE IF:  You have wheezing and shortness of breath even if taking medicine to prevent attacks.  You have muscle aches, chest pain, or thickening of sputum.  Your sputum changes from clear or white to yellow, green, gray, or bloody. SEEK IMMEDIATE MEDICAL CARE IF:  Your usual medicines do not stop your wheezing.  You have increased coughing or shortness of breath or both.  You have increased difficulty breathing.  You have any problems from the medicine you are taking, such as a rash, itching, swelling, or trouble breathing. MAKE SURE YOU:   Understand these instructions.  Will watch your condition.  Will get help right away if you are not doing well or get worse. Document Released: 09/30/2006 Document Revised: 04/29/2014 Document Reviewed: 01/21/2014 [facility] Patient Information 2015 [place], [place]. This information is not intended to replace advice given to you by your health care provider. Make sure you discuss any questions you have with your health care provider.

## 2015-09-29 NOTE — Progress Notes (Signed)
SLEEP MEDICINE CLINIC   Provider:  Larey Seat, M D  Referring Provider: Leanna Battles, MD Primary Care Physician:  Donnajean Lopes, MD  Chief Complaint  Patient presents with  . Follow-up    ONO results, HST results, on cpap, rm 10, alone    HPI:  EULIS SALAZAR is a 69 y.o. male, seen here as a referral from Dr. Philip Aspen for a sleep consultation,  I see Mr. Maclaughlin today for the first time after about 7 years and he was referred originally in early 2009 for evaluation of possible sleep apnea he was diagnosed with sleep apnea and titrated to CPAP 9 cm water and has continued to use the machine faithfully at that setting. His titration dated from 08-10-2008. Mr. Adelene Amas now reports that he feels tired and fatigued all the time not necessarily sleepy but that there is a significant decrease in his energy. His baseline study he had an apnea-hypotony index of 25.4 at the body mass index of 26 and a neck circumference of 15.25 inches. He had endorsed the Epworth sleepiness score in 2009 at only 2 points. Mr. Dhaliwal retired in 2013, he goes to bed at 9.30 PM.  Usually falls asleep promptly. He likes to watch some TV, usually not in bed. His Dog, Mr Frankey Poot,  wakes him at 6 AM.  He has no nocturia , no witnessed  Snoring on CPAP. He reports to dream, but not to act out, is no report of sleepwalking, sleep talking, sleep eating. He sometimes a takes a nap in PM , and his wife noted him to snore. He does consume some coffee but only in the morning. 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 He feels fatigued and non restored a couple of mornings, these afternoons are the nap times. He sleeps not longer than 45 minutes, he feels not refreshed  if he naps without CPAP. No history of cervical, ENT or TBI .  Interval history from 09-29-15,  Mr. Ala underwent a home sleep test or out of center test which identified a mild apnea with an AHI  of 12.5. The lowest oxygen saturation that night was 81% but he accumulated 331 minutes of desaturations between 60 and 90%. Remarkably his heart rate was stable throughout the night which could indicate an artifact in oxygen levels as measured by the home sleep test.  For this reason an overnight pulse oximetry was performed and the patient underwent this test on 08/30/2015. Here again the SPO2 state at or below 89% for 2 hours and 59 minutes but he seemed not to have a lot of pulse variation. However,  I would consider this now to be a correct reading and for that reason would like the patient to use CPAP.  A dental device or an ENT surgery are not helpful and hypoxemia correction.  The patient's download from his CPAP machine was obtained today and shows 100% of the last 30 days use over 4 consecutive hours.  He even used the machine 8 hours and 21 minutes on average. He is using auto set, between 5 and 15 cm water pressure with 3 cm expiratory pressure relief. His AHI was 0.9 with is an excellent resolution. He has reached a 95% pressure of 9 cm .  he reports that he wakes more often up at night now, he looks at the clock. It is usually at 2 and  4 AM .  He had some gout and increased the fluid intake, thus having 1 nocturia. No pain. He rarely naps in daytime, when it happens its after lunch. He has no identified primary lung disease.  I need now to further evaluate him for the low oxygen levels while on CPAP, while he is happy with its use and he likes the interface. I will refer this nice gentleman to Dr.  Halford Chessman or Dr. Lamonte Sakai.   Review of Systems: Out of a complete 14 system review, the patient complains of only the following symptoms, and all other reviewed systems are negative.   Epworth score 2 from 3 , Fatigue severity score  20 from 26  , depression score 0 from 2 , all changes on CPAP.    Social History   Social History  . Marital Status: Married    Spouse Name: N/A  . Number of  Children: N/A  . Years of Education: N/A   Occupational History  . retired    Social History Main Topics  . Smoking status: Current Every Day Smoker  . Smokeless tobacco: Not on file  . Alcohol Use: 4.2 oz/week    7 Standard drinks or equivalent per week  . Drug Use: No  . Sexual Activity: Not on file   Other Topics Concern  . Not on file   Social History Narrative   Drinks 2 cups of coffee in the morning.    Family History  Problem Relation Age of Onset  . Hypertension Mother   . Hypertension Father     Past Medical History  Diagnosis Date  . Hyperlipidemia   . Kidney stone   . Gout   . OSA on CPAP   . Erectile dysfunction   . Knee pain     Past Surgical History  Procedure Laterality Date  . Lipoma excision    . Cataract extraction, bilateral    . Mohs surgery      Current Outpatient Prescriptions  Medication Sig Dispense Refill  . allopurinol (ZYLOPRIM) 300 MG tablet Take 300 mg by mouth daily.    Marland Kitchen aspirin 81 MG tablet Take 81 mg by mouth daily.    Marland Kitchen ipratropium (ATROVENT) 0.06 % nasal spray Place 2 sprays into both nostrils 4 (four) times daily.    Marland Kitchen loratadine (CLARITIN) 10 MG tablet Take 10 mg by mouth daily.    Marland Kitchen losartan (COZAAR) 100 MG tablet Take 100 mg by mouth daily.    . Melatonin 3 MG TABS Take 3 mg by mouth daily.    . mometasone (NASONEX) 50 MCG/ACT nasal spray Place 2 sprays into the nose daily.    . Nutritional Supplements (MELATONIN PO) Take by mouth at bedtime as needed.    . pravastatin (PRAVACHOL) 20 MG tablet Take 20 mg by mouth daily.    Marland Kitchen scopolamine (TRANSDERM-SCOP) 1 MG/3DAYS Place 1 patch onto the skin every 3 (three) days as needed (for travel).     . sildenafil (VIAGRA) 100 MG tablet Take 40 mg by mouth as needed for erectile dysfunction.     . Tamsulosin HCl (FLOMAX) 0.4 MG CAPS Take by mouth daily.     No current facility-administered medications for this visit.    Allergies as of 09/29/2015  . (No Known Allergies)     Vitals: BP 146/68 mmHg  Pulse 86  Resp 20  Ht 5\' 8"  (1.727 m)  Wt 169 lb (76.658 kg)  BMI 25.70 kg/m2 Last Weight:  Wt Readings from Last 1 Encounters:  09/29/15 169 lb (76.658 kg)       Last Height:   Ht Readings from Last 1 Encounters:  09/29/15 5\' 8"  (1.727 m)    Physical exam:  General: The patient is awake, alert and appears not in acute distress. The patient is well groomed. Head: Normocephalic, atraumatic. Neck is supple. Mallampati2 , left deviated uvula, ,  neck circumference: 16 . Nasal airflow unrestricted- but he reports post nasal drip at night.  , TMJ is not  evident . Retrognathia is seen.  Cardiovascular:  Regular rate and rhythm , without  murmurs or carotid bruit, and without distended neck veins. Respiratory: Lungs are clear to auscultation. Skin:  Without evidence of edema, or rash Trunk: BMI is not  elevated and patient  has normal posture.  Neurologic exam : The patient is awake and alert, oriented to place and time.   Memory subjective  described as intact. There is a normal attention span & concentration ability. Speech is fluent without  dysarthria, dysphonia or aphasia. Mood and affect are appropriate.  Cranial nerves: Pupils are equal and briskly reactive to light. Funduscopic exam without  evidence of pallor , status post cataract surgery.  Extraocular movements  in vertical and horizontal planes intact and without nystagmus. Visual fields by finger perimetry are intact. Hearing to finger rub intact.  Facial sensation intact to fine touch. Facial motor strength is symmetric and tongue and uvula move midline.  Motor exam: Normal tone ,muscle bulk and symmetric ,strength in all extremities.  Sensory:  Fine touch, pinprick and vibration were tested in all extremities. Proprioception is normal.  Coordination: Rapid alternating movements in the fingers/hands is normal.  Finger-to-nose maneuver  normal without evidence of ataxia, dysmetria or  tremor.  Gait and station: Patient walks without assistive device and is able unassisted to climb up to the exam table.  Strength within normal limits.   Deep tendon reflexes: in the  upper and lower extremities are symmetric and intact. Babinski maneuver response is downgoing.   Assessment:  After physical and neurologic examination, review of laboratory studies, imaging, neurophysiology testing and pre-existing records,  25 minute assessment is    OSA , required CPAP  due to hypoxemia. Hypoxemia perisists on CPAP/   I was able to obtain a download from Mr. Bachar's CPAP machine. His download documents some significant air leaks and  he liked the new FFM.  In addition his apnea index is well-controlled , AHI on auto -CPAP is 0.9 and his hypoxemia was confirmed to persist.   95% is 9 cm water with an EPR of 3. He is a habitual mouth breather and has also issues with rhinitis-sinusitis and for this reason has always preferred a full face mask.    The patient was advised of the nature of the diagnosed sleep disorder , the treatment options and risks for general a health and wellness arising from not treating the condition. Visit duration was 25 minutes.  More than 50 % of today's face to face visit time were dedicated to the causes of Hypoxemia in a patient with seasonal coughing , but no COPD or asthma diagnosis has officially been established. But he suffers  Frequently from Bronchitis.  He is a former smoker, quit only 2 weeks ago.  Plan:  Treatment plan and additional workup :  I will refer the patient to either Dr. Nicole Kindred or Dr. Lamonte Sakai at the pulmonology division. I suspect that he may have early COPD because of his history of bronchitis  but the bronchitis is still described as seasonal. I'm concerned about the persistent hypoxemia while using CPAP as confirmed in an overnight pulse oximetry and intermittent home sleep test. I think the pulmonology colleagues for their assistance in advance.  I will follow Mr. Pidcock once a year for his CPAP compliance.     Asencion Partridge Stephanine Reas MD  09/29/2015

## 2015-09-29 NOTE — Telephone Encounter (Signed)
Called and Patient is scheduled with Dr. Christinia Gully 10/07/2015. Arrive at 2:15 for 2:30 apt. Dr. Halford Chessman and Lamonte Sakai were booked until the end of Nov. And December.  Called patient he is aware of apt. Time and date.

## 2015-10-01 ENCOUNTER — Ambulatory Visit: Payer: Self-pay | Admitting: Neurology

## 2015-10-07 ENCOUNTER — Ambulatory Visit (INDEPENDENT_AMBULATORY_CARE_PROVIDER_SITE_OTHER): Payer: Medicare Other | Admitting: Internal Medicine

## 2015-10-07 ENCOUNTER — Encounter: Payer: Self-pay | Admitting: Internal Medicine

## 2015-10-07 ENCOUNTER — Ambulatory Visit (INDEPENDENT_AMBULATORY_CARE_PROVIDER_SITE_OTHER)
Admission: RE | Admit: 2015-10-07 | Discharge: 2015-10-07 | Disposition: A | Discharge status: Home / Self Care | Payer: Medicare Other | Source: Ambulatory Visit | Attending: Internal Medicine | Admitting: Internal Medicine

## 2015-10-07 VITALS — BP 150/88 | HR 91 | Ht 67.75 in | Wt 169.8 lb

## 2015-10-07 DIAGNOSIS — J310 Chronic rhinitis: Secondary | ICD-10-CM | POA: Diagnosis not present

## 2015-10-07 DIAGNOSIS — G4734 Idiopathic sleep related nonobstructive alveolar hypoventilation: Secondary | ICD-10-CM | POA: Diagnosis not present

## 2015-10-07 DIAGNOSIS — F17210 Nicotine dependence, cigarettes, uncomplicated: Secondary | ICD-10-CM | POA: Diagnosis not present

## 2015-10-07 DIAGNOSIS — R0902 Hypoxemia: Secondary | ICD-10-CM

## 2015-10-07 MED ORDER — MOMETASONE FUROATE 50 MCG/ACT NA SUSP: 2.0000 | Freq: Every day | NASAL | Status: DC

## 2015-10-07 NOTE — Progress Notes (Signed)
Quick Note:  Spoke with pt and notified of results per Dr. Wert. Pt verbalized understanding and denied any questions.  ______ 

## 2015-10-07 NOTE — Patient Instructions (Addendum)
Please remember to go to the  x-ray department downstairs for your tests - we will call you with the results when they are available.  I emphasized that nasal steroids (nasonex)  have no immediate benefit in terms of improving symptoms.  To help them reached the target tissue, the patient should use Afrin two puffs every 12 hours applied one min before using the nasal steroids.  Afrin should be stopped after no more than 5 days.  If the symptoms worsen, Afrin 12h  can be restarted after 5 days off of therapy to prevent rebound congestion from overuse of Afrin.  I also emphasized that in no way are nasal steroids a concern in terms of "addiction".     Please schedule a follow up office visit in 4 weeks, sooner if needed with pfts on return  Add:  Repeat ono on present cpap when returns

## 2015-10-07 NOTE — Progress Notes (Signed)
Subjective:     Patient ID: John Potts, male   DOB: 15-Dec-1946,    MRN: [id number]  HPI  52 yowm active smoker with chronic nasal congestion with sensation of pnds x 2006 followed by Dr Maureen Chatters on cpap since the same time but 08/30/15  ono on cpap so referred to pulmonary clinic 10/07/2015    10/07/2015 1st [facility] office visit/ Wert   Chief Complaint  Patient presents with  . Pulmonary Consult    Referred by Dr. Brett Fairy for eval of nocturnal hypoxia. Pt has been on CPAP for approx 9 yrs. He c/o occ cough and congestion at night which he relates to PND.    not feeling as good on cpap x summer 2016 / nasal congestion worse in ams/ quit smoking cigarettes x 3 weeks, occ cigar still and since quit cigs is feeling a lot better  Not walking as much as used to due to arthritis R knee and R hip but not limited by breathing and is mostly bothered by r > l chronic nasal obst not resp to nasonex or atrovent and has never used saba.   No obvious day to day or daytime variability or assoc chronic cough or cp or chest tightness, subjective wheeze or overt  hb symptoms. No unusual exp hx or h/o childhood pna/ asthma or knowledge of premature birth.  Sleeping ok without nocturnal  or early am exacerbation  of respiratory  c/o's or need for noct saba. Also denies any obvious fluctuation of symptoms with weather or environmental changes or other aggravating or alleviating factors except as outlined above   Current Medications, Allergies, Complete Past Medical History, Past Surgical History, Family History, and Social History were reviewed in [employer] record.  ROS  The following are not active complaints unless bolded sore throat, dysphagia, dental problems, itching, sneezing,  nasal congestion or excess/ purulent secretions, ear ache,   fever, chills, sweats, unintended wt loss, classically pleuritic or exertional cp, hemoptysis,  orthopnea pnd or leg swelling, presyncope,  palpitations, abdominal pain, anorexia, nausea, vomiting, diarrhea  or change in bowel or bladder habits, change in stools or urine, dysuria,hematuria,  rash, arthralgias, visual complaints, headache, numbness, weakness or ataxia or problems with walking or coordination,  change in mood/affect or memory.         Review of Systems     Objective:   Physical Exam    amb wm nad  Wt Readings from Last 3 Encounters:  10/07/15 169 lb 12.8 oz (77.021 kg)  09/29/15 169 lb (76.658 kg)  07/21/15 174 lb (78.926 kg)    Vital signs reviewed   HEENT: nl dentition, turbinates, and orophanx. Nl external ear canals without cough reflex   NECK :  without JVD/Nodes/TM/ nl carotid upstrokes bilaterally   LUNGS: no acc muscle use, clear to A and P bilaterally without cough on insp or exp maneuvers   CV:  RRR  no s3 or murmur or increase in P2, no edema   ABD:  soft and nontender with nl excursion in the supine position. No bruits or organomegaly, bowel sounds nl  MS:  warm without deformities, calf tenderness, cyanosis or clubbing  SKIN: warm and dry without lesions    NEURO:  alert, approp, no deficits      CXR PA and Lateral:   10/07/2015 :    I personally reviewed images and agree with radiology impression as follows:    No active cardiopulmonary disease. Peribronchial thickening may indicate  bronchitis.    Assessment:

## 2015-10-08 ENCOUNTER — Encounter: Payer: Self-pay | Admitting: Internal Medicine

## 2015-10-08 DIAGNOSIS — F17210 Nicotine dependence, cigarettes, uncomplicated: Secondary | ICD-10-CM | POA: Insufficient documentation

## 2015-10-08 DIAGNOSIS — J310 Chronic rhinitis: Secondary | ICD-10-CM | POA: Insufficient documentation

## 2015-10-08 NOTE — Assessment & Plan Note (Signed)
Reports quit about 09/27/15 > replaced with cigars   > 3 min discussion I reviewed the Fletcher curve with the patient that basically indicates  if you quit smoking when your best day FEV1 is still well preserved (as appears to be  the case here pending pfts)  it is highly unlikely you will progress to severe disease and informed the patient there was no medication on the market that has proven to alter the curve/ its downward trajectory  or the likelihood of progression of their disease.  Therefore stopping smoking and maintaining abstinence is the most important aspect of care, not choice of inhalers or for that matter, doctors.     the cigar smoking certainly isn't improvement but I suspect he is inhaling some and is a risk for a bridge back to cigarette smoking. He may need to consider Chantix or a nicotine replacement regimen if this occurs but reports so far so good and congratulated on success to date

## 2015-10-08 NOTE — Assessment & Plan Note (Signed)
I emphasized that nasal steroids have no immediate benefit in terms of improving symptoms.  To help them reached the target tissue, the patient should use Afrin two puffs every 12 hours applied one min before using the nasal steroids.  Afrin should be stopped after no more than 5 days.  If the symptoms worsen, Afrin can be restarted after 5 days off of therapy to prevent rebound congestion from overuse of Afrin.  I also emphasized that in no way are nasal steroids a concern in terms of "addiction".  

## 2015-10-08 NOTE — Assessment & Plan Note (Signed)
ono on Cpap 08/30/15 >  sats < 89% x 1: 4m - 10/07/2015  Walked RA x 3 laps @ 185 ft each stopped due to  End of study, nl pace, no sob or desat    he tells me the reason he underwent a repeat study is that he didn't feel he was doing as well on the CPAP but now he is and has replaced cigarettes with cigars which he says he doesn't inhale. For now I think is reasonable therefore simply to repeat the overnight oximetry on his new settings and have him return for set of PFTs.

## 2015-10-14 ENCOUNTER — Telehealth: Payer: Self-pay | Admitting: Internal Medicine

## 2015-10-14 NOTE — Telephone Encounter (Signed)
LMTCB x1 for pt w/ spouse

## 2015-10-15 NOTE — Telephone Encounter (Signed)
Spoke with the pt  I advised that generic nasonex is okay  Nothing further needed

## 2015-11-12 ENCOUNTER — Encounter: Payer: Self-pay | Admitting: Internal Medicine

## 2015-11-12 ENCOUNTER — Other Ambulatory Visit (INDEPENDENT_AMBULATORY_CARE_PROVIDER_SITE_OTHER): Payer: Medicare Other

## 2015-11-12 ENCOUNTER — Ambulatory Visit (INDEPENDENT_AMBULATORY_CARE_PROVIDER_SITE_OTHER): Payer: Medicare Other | Admitting: Internal Medicine

## 2015-11-12 VITALS — BP 130/82 | HR 87 | Ht 69.5 in | Wt 165.0 lb

## 2015-11-12 DIAGNOSIS — G4734 Idiopathic sleep related nonobstructive alveolar hypoventilation: Secondary | ICD-10-CM

## 2015-11-12 DIAGNOSIS — Z720 Tobacco use: Secondary | ICD-10-CM

## 2015-11-12 DIAGNOSIS — J310 Chronic rhinitis: Secondary | ICD-10-CM | POA: Diagnosis not present

## 2015-11-12 DIAGNOSIS — F17210 Nicotine dependence, cigarettes, uncomplicated: Secondary | ICD-10-CM

## 2015-11-12 LAB — CBC WITH DIFFERENTIAL/PLATELET
BASOS ABS: 0 10*3/uL (ref 0.0–0.1)
Basophils Relative: 0.3 % (ref 0.0–3.0)
EOS ABS: 0.4 10*3/uL (ref 0.0–0.7)
Eosinophils Relative: 4.6 % (ref 0.0–5.0)
HCT: 47.4 % (ref 39.0–52.0)
HEMOGLOBIN: 15.1 g/dL (ref 13.0–17.0)
LYMPHS ABS: 2.1 10*3/uL (ref 0.7–4.0)
Lymphocytes Relative: 22.8 % (ref 12.0–46.0)
MCHC: 31.9 g/dL (ref 30.0–36.0)
MCV: 90.1 fl (ref 78.0–100.0)
MONO ABS: 0.7 10*3/uL (ref 0.1–1.0)
Monocytes Relative: 8.3 % (ref 3.0–12.0)
NEUTROS PCT: 64 % (ref 43.0–77.0)
Neutro Abs: 5.8 10*3/uL (ref 1.4–7.7)
Platelets: 235 10*3/uL (ref 150.0–400.0)
RBC: 5.26 Mil/uL (ref 4.22–5.81)
RDW: 14.1 % (ref 11.5–15.5)
WBC: 9 10*3/uL (ref 4.0–10.5)

## 2015-11-12 LAB — PULMONARY FUNCTION TEST
DL/VA % PRED: 96 %
DL/VA: 4.43 ml/min/mmHg/L
DLCO unc % pred: 86 %
DLCO unc: 27.52 ml/min/mmHg
FEF 25-75 Post: 3.83 L/sec
FEF 25-75 Pre: 3.62 L/sec
FEF2575-%CHANGE-POST: 5 %
FEF2575-%PRED-PRE: 144 %
FEF2575-%Pred-Post: 152 %
FEV1-%Change-Post: 2 %
FEV1-%PRED-POST: 111 %
FEV1-%Pred-Pre: 109 %
FEV1-PRE: 3.55 L
FEV1-Post: 3.63 L
FEV1FVC-%Change-Post: 1 %
FEV1FVC-%PRED-PRE: 107 %
FEV6-%CHANGE-POST: 0 %
FEV6-%PRED-POST: 107 %
FEV6-%Pred-Pre: 106 %
FEV6-POST: 4.46 L
FEV6-PRE: 4.43 L
FEV6FVC-%Change-Post: 0 %
FEV6FVC-%PRED-PRE: 104 %
FEV6FVC-%Pred-Post: 105 %
FVC-%CHANGE-POST: 0 %
FVC-%Pred-Post: 101 %
FVC-%Pred-Pre: 101 %
FVC-POST: 4.47 L
FVC-PRE: 4.45 L
POST FEV1/FVC RATIO: 81 %
POST FEV6/FVC RATIO: 100 %
Pre FEV1/FVC ratio: 80 %
Pre FEV6/FVC Ratio: 100 %
RV % pred: 85 %
RV: 2.05 L
TLC % pred: 105 %
TLC: 7.29 L

## 2015-11-12 NOTE — Progress Notes (Signed)
PFT done today. 

## 2015-11-12 NOTE — Progress Notes (Signed)
Subjective:     Patient ID: John Potts, male   DOB: [birth year],    MRN: [id number]    Brief patient profile:  4 yowm active smoker with chronic nasal congestion with sensation of pnds x 2006 followed by Dr Maureen Chatters on cpap since the same time but 08/30/15  ono on cpap desaturated  so referred to pulmonary clinic 10/07/2015    History of Present Illness  10/07/2015 1st [facility] office visit/ [facility]   Chief Complaint  Patient presents with  . Pulmonary Consult    Referred by Dr. Brett Fairy for eval of nocturnal hypoxia. Pt has been on CPAP for approx 9 yrs. He c/o occ cough and congestion at night which he relates to PND.    not feeling as good on cpap x summer 2016 / nasal congestion worse in ams/ quit smoking cigarettes x 3 weeks, occ cigar still and since quit cigs is feeling a lot better rec I emphasized that nasal steroids (nasonex) has no immediate benefit > use afrin x 5 day cycles  Return for pfts/ stop all smoking    11/12/2015  f/u ov/[facility] re: nocturnal hypoxemia even on cpap with nl ex sats and pfts  Chief Complaint  Patient presents with  . Follow-up    PFT done today. Pt states that his cough is unchanged.   always had nasal symptoms since childhood, year round , never eval by allergy or ent   Not walking as much as used to due to arthritis R knee and R hip but not limited by breathing and is mostly bothered by r > l chronic nasal obst not resp to nasonex or atrovent and has never used saba.  Nasal symptoms some better with short courses of afrin.  No obvious day to day or daytime variability or assoc chronic cough or cp or chest tightness, subjective wheeze or overt  hb symptoms. No unusual exp hx or h/o childhood pna/ asthma or knowledge of premature birth.  Sleeping ok without nocturnal  or early am exacerbation  of respiratory  c/o's or need for noct saba. Also denies any obvious fluctuation of symptoms with weather or environmental changes or other aggravating or  alleviating factors except as outlined above   Current Medications, Allergies, Complete Past Medical History, Past Surgical History, Family History, and Social History were reviewed in [employer] record.  ROS  The following are not active complaints unless bolded sore throat, dysphagia, dental problems, itching, sneezing,  nasal congestion or excess/ purulent secretions, ear ache,   fever, chills, sweats, unintended wt loss, classically pleuritic or exertional cp, hemoptysis,  orthopnea pnd or leg swelling, presyncope, palpitations, abdominal pain, anorexia, nausea, vomiting, diarrhea  or change in bowel or bladder habits, change in stools or urine, dysuria,hematuria,  rash, arthralgias, visual complaints, headache, numbness, weakness or ataxia or problems with walking or coordination,  change in mood/affect or memory.               Objective:   Physical Exam    amb wm nad  11/12/2015      165     10/07/15 169 lb 12.8 oz (77.021 kg)  09/29/15 169 lb (76.658 kg)  07/21/15 174 lb (78.926 kg)    Vital signs reviewed   HEENT: nl dentition, , and orophanx. Nl external ear canals without cough reflex- nasal turbinates mod bilateral nonspecific edema   NECK :  without JVD/Nodes/TM/ nl carotid upstrokes bilaterally   LUNGS: no acc muscle use, clear  to A and P bilaterally without cough on insp or exp maneuvers   CV:  RRR  no s3 or murmur or increase in P2, no edema   ABD:  soft and nontender with nl excursion in the supine position. No bruits or organomegaly, bowel sounds nl  MS:  warm without deformities, calf tenderness, cyanosis or clubbing  SKIN: warm and dry without lesions    NEURO:  alert, approp, no deficits      CXR PA and Lateral:   10/07/2015 :    I personally reviewed images and agree with radiology impression as follows:    No active cardiopulmonary disease. Peribronchial thickening may indicate bronchitis.  11/12/2015 labs ordered:  allergy profile     Assessment:

## 2015-11-12 NOTE — Patient Instructions (Signed)
Please see patient coordinator before you leave today  to schedule sinus CT and repeat overnight 02 sat on room air after using afrin 2 pffs before you put on the cpap that night  Please remember to go to the lab  department downstairs for your tests - we will call you with the results when they are available.   If you are satisfied with your treatment plan,  let your doctor know and he/she can either refill your medications or you can return here when your prescription runs out.     If in any way you are not 100% satisfied,  please tell us.  If 100% better, tell your friends!  Pulmonary follow up is as needed

## 2015-11-13 ENCOUNTER — Encounter: Payer: Self-pay | Admitting: Internal Medicine

## 2015-11-13 LAB — ALLERGY FULL PROFILE
Allergen, D pternoyssinus,d7: <0.1 kU/L
Allergen,Goose feathers, e70: <0.1 kU/L
Alternaria Alternata: <0.1 kU/L
Aspergillus fumigatus, m3: <0.1 kU/L
Bahia Grass: <0.1 kU/L
Bermuda Grass: <0.1 kU/L
Cat Dander: <0.1 kU/L
Common Ragweed: <0.1 kU/L
D. farinae: <0.1 kU/L
Elm IgE: <0.1 kU/L
Fescue: <0.1 kU/L
G009 Red Top: <0.1 kU/L
IgE (Immunoglobulin E), Serum: 23 kU/L (ref <115)
Oak: <0.1 kU/L
Plantain: <0.1 kU/L

## 2015-11-13 NOTE — Assessment & Plan Note (Signed)
Allergy profile 11/12/2015 >> Sinus CT 11/13/2015 >>>   Will arrange f/u with allergy or ent if indicated/ o/w just use nasal steroids with prn (x 5 days only ) afrin

## 2015-11-13 NOTE — Assessment & Plan Note (Addendum)
ono on Cpap 08/30/15 >  sats < 89% x 1: 9m - 10/07/2015  Walked RA x 3 laps @ 185 ft each stopped due to  End of study, nl pace, no sob or desat   - 11/12/2015 pfts wnl   Strongly suspect the nasal obst is interfering with adequate delivery of cpap and that is what he desats, if in fact that's still what's happening > rec allergy profile/ ent eval next and add 02 to circuit if still sig desaturating now.   I had an extended discussion with the patient reviewing all relevant studies completed to date and  lasting 15 to 20 minutes of a 25 minute visit    Each maintenance medication was reviewed in detail including most importantly the difference between maintenance and prns and under what circumstances the prns are to be triggered using an action plan format that is not reflected in the computer generated alphabetically organized AVS.    Please see instructions for details which were reviewed in writing and the patient given a copy highlighting the part that I personally wrote and discussed at today's ov.

## 2015-11-13 NOTE — Assessment & Plan Note (Signed)
  I reviewed the Fletcher curve with the patient that basically indicates  if you quit smoking when your best day FEV1 is still well preserved (as is clearly  the case here)  it is highly unlikely you will progress to severe disease and informed the patient there was no medication on the market that has proven to alter the curve/ its downward trajectory  or the likelihood of progression of their disease.  Therefore stopping smoking and maintaining abstinence is the most important aspect of care, not choice of inhalers or for that matter, doctors.   

## 2015-11-17 ENCOUNTER — Ambulatory Visit (INDEPENDENT_AMBULATORY_CARE_PROVIDER_SITE_OTHER)
Admission: RE | Admit: 2015-11-17 | Discharge: 2015-11-17 | Disposition: A | Discharge status: Home / Self Care | Payer: Medicare Other | Source: Ambulatory Visit | Attending: Internal Medicine | Admitting: Internal Medicine

## 2015-11-17 DIAGNOSIS — J310 Chronic rhinitis: Secondary | ICD-10-CM

## 2015-11-18 ENCOUNTER — Other Ambulatory Visit: Payer: Self-pay | Admitting: Internal Medicine

## 2015-11-18 DIAGNOSIS — J328 Other chronic sinusitis: Secondary | ICD-10-CM

## 2015-11-28 ENCOUNTER — Telehealth: Payer: Self-pay | Admitting: Internal Medicine

## 2015-11-28 NOTE — Telephone Encounter (Signed)
Reviewing MW's look at's. Pt had ONO on RA on 11/28 and per MW the ONO was negative for desat so pt will not need O2. Because the pt was already called today (11/28/2015), will await call back from pt.

## 2015-11-28 NOTE — Telephone Encounter (Signed)
LMTCB x1 w/ spouse 

## 2015-11-28 NOTE — Telephone Encounter (Signed)
[phone] pt cb

## 2015-11-28 NOTE — Telephone Encounter (Signed)
I spoke with patient about results and he verbalized understanding and had no questions 

## 2015-12-02 ENCOUNTER — Telehealth: Payer: Self-pay | Admitting: Internal Medicine

## 2015-12-02 NOTE — Telephone Encounter (Signed)
ONO done through [facility] 11/24/15  Per MW- this was normal, no need for o2  Spoke with pt and notified of results per Dr. Melvyn Novas. Pt verbalized understanding and denied any questions.

## 2015-12-09 ENCOUNTER — Encounter: Payer: Self-pay | Admitting: Internal Medicine

## 2016-03-29 ENCOUNTER — Telehealth: Payer: Self-pay | Admitting: Neurology

## 2016-03-29 DIAGNOSIS — G4734 Idiopathic sleep related nonobstructive alveolar hypoventilation: Secondary | ICD-10-CM

## 2016-03-29 DIAGNOSIS — G4733 Obstructive sleep apnea (adult) (pediatric): Secondary | ICD-10-CM

## 2016-03-29 DIAGNOSIS — J322 Chronic ethmoidal sinusitis: Secondary | ICD-10-CM

## 2016-03-29 DIAGNOSIS — Z9989 Dependence on other enabling machines and devices: Principal | ICD-10-CM

## 2016-03-29 NOTE — Telephone Encounter (Signed)
Patient called to advise, he is still having problems with low oxygen at night, did see other Dr's that Dr. Brett Fairy referred to (Pulmonary-Dr. Melvyn Novas) Dr. Melvyn Novas, referred to  ENT-Dr. Janace Hoard), however still continues to have low oxygen, states Dr. Brett Fairy mentioned adding oxygen to CPAP. Please call cell# [phone] or hm# [phone].

## 2016-03-29 NOTE — Telephone Encounter (Signed)
Pt has seen Dr. Melvyn Novas and an ONO performed in 10/2015 was negative. Below is Dr. Gustavus Bryant note. Please note that pt is U[employer]/Medicare and ordering oxygen for medicare patients is not possible with an ONO. Pt has had an HST.  Per Dr. Melvyn Novas: "ono on Cpap 08/30/15 > sats < 89% x 1: 44m - 10/07/2015 Walked RA x 3 laps @ 185 ft each stopped due to End of study, nl pace, no sob or desat  - 11/12/2015 pfts wnl   Strongly suspect the nasal obst is interfering with adequate delivery of cpap and that is what he desats, if in fact that's still what's happening > rec allergy profile/ ent eval next and add 02 to circuit if still sig desaturating now. "

## 2016-03-29 NOTE — Telephone Encounter (Signed)
I spoke to Mr. Kamphuis in detail. Pulmonologist Dr. Felton Clinton saw the patient and could not find oxygen desaturations while in office. He then ordered an overnight pulse oximetry 1 night baseline study the next night after the patient took Afrin nasal spray. It turned out that the second night showed much less oxygen desaturation than the first. Upon this result the patient was referred to ear nose and throat specialist, Dr. Janace Hoard. Dr. bias diagnosed sinusitis ordered a CT scan of the sinus cavities and gave the patient antibiotics. The patient felt well for about 10 days after the antibiotic treatment concluded then he developed the same symptoms again and Dr. Janace Hoard ordered another round of antibiotics. The patient reports his impression that Dr. bias did not think he was a surgical candidate or a need of sinus surgery. He still cannot sleep well does not feel refreshed and restored in the morning as he used to and he has again signs and symptoms of sinusitis.  I told the patient that I have to different avenues to pursue  #1 Mr. Aust Dr. Janace Hoard if he has a more lasting treatment besides repeated antibiotics and  #2 is that he could return for a sleep study with a re-titration of CPAP and oxygen. He also would like to use an auto- titrated at home allowing him to adjust automatically and in need of higher pressures. I will order a CPAP re-titration with oxygen titration for the patient and left a voicemail with Dr. Janace Hoard office. CD

## 2016-04-05 NOTE — Telephone Encounter (Signed)
Patient called to advise, he's never heard from anyone regarding "out of home sleep study". Please call [phone].

## 2016-04-06 NOTE — Telephone Encounter (Signed)
Our sleep study scheduler was able to get in contact with this pt this morning and his cpap titration is scheduled for 04/22/16.

## 2016-04-22 ENCOUNTER — Ambulatory Visit (INDEPENDENT_AMBULATORY_CARE_PROVIDER_SITE_OTHER): Payer: Medicare Other | Admitting: Neurology

## 2016-04-22 DIAGNOSIS — Z9989 Dependence on other enabling machines and devices: Principal | ICD-10-CM

## 2016-04-22 DIAGNOSIS — J322 Chronic ethmoidal sinusitis: Secondary | ICD-10-CM

## 2016-04-22 DIAGNOSIS — G4734 Idiopathic sleep related nonobstructive alveolar hypoventilation: Secondary | ICD-10-CM

## 2016-04-22 DIAGNOSIS — G4733 Obstructive sleep apnea (adult) (pediatric): Secondary | ICD-10-CM

## 2016-04-27 ENCOUNTER — Telehealth: Payer: Self-pay

## 2016-04-27 DIAGNOSIS — G4733 Obstructive sleep apnea (adult) (pediatric): Secondary | ICD-10-CM

## 2016-04-27 NOTE — Telephone Encounter (Signed)
Spoke to pt and advised him that his cpap titration showed significant improvement with less respiratory events when compared to a previous HST. Pt did suffer REM sleep associated hypoxia at 7 cm H2O. Pt did not desaturate to 85 % and for less than one minute at a time, thus he did not qualify for oxygen. Dr. Brett Fairy recommends starting a cpap at 10 cm H2O. Pt already has a cpap, so he asked me to send the order to [facility] and ask them to change the pressure. A follow up appt was made for June 19th, 2017. Pt verbalized understanding.

## 2016-06-14 ENCOUNTER — Ambulatory Visit (INDEPENDENT_AMBULATORY_CARE_PROVIDER_SITE_OTHER): Payer: Medicare Other | Admitting: Neurology

## 2016-06-14 ENCOUNTER — Encounter: Payer: Self-pay | Admitting: Neurology

## 2016-06-14 VITALS — BP 138/78 | HR 80 | Resp 20 | Ht 68.0 in | Wt 155.0 lb

## 2016-06-14 DIAGNOSIS — G4733 Obstructive sleep apnea (adult) (pediatric): Secondary | ICD-10-CM | POA: Diagnosis not present

## 2016-06-14 DIAGNOSIS — G4734 Idiopathic sleep related nonobstructive alveolar hypoventilation: Secondary | ICD-10-CM | POA: Insufficient documentation

## 2016-06-14 DIAGNOSIS — Z9989 Dependence on other enabling machines and devices: Principal | ICD-10-CM

## 2016-06-14 NOTE — Progress Notes (Signed)
SLEEP MEDICINE CLINIC   Provider:  Larey Seat, M D  Referring Provider: Leanna Battles, MD Primary Care Physician:  Donnajean Lopes, MD  Chief Complaint  Patient presents with  . Follow-up    new cpap pressure working    HPI:  John Potts is a 70 y.o. male, seen here as a referral from Dr. Philip Aspen for a sleep consultation,  I see John Potts today for the first time after about 7 years and he was referred originally in early 2009 for evaluation of possible sleep apnea he was diagnosed with sleep apnea and titrated to CPAP 9 cm water and has continued to use the machine faithfully at that setting. His titration dated from 08-10-2008. John Potts now reports that he feels tired and fatigued all the time not necessarily sleepy but that there is a significant decrease in his energy. His baseline study he had an apnea-hypotony index of 25.4 at the body mass index of 26 and a neck circumference of 15.25 inches. He had endorsed the Epworth sleepiness score in 2009 at only 2 points. John Potts retired in 2013, he goes to bed at 9.30 PM.  Usually falls asleep promptly. He likes to watch some TV, usually not in bed. His Dog, John Potts,  wakes him at 6 AM.  He has no nocturia , no witnessed  Snoring on CPAP. He reports to dream, but not to act out, is no report of sleepwalking, sleep talking, sleep eating. He sometimes a takes a nap in PM , and his wife noted him to snore. He does consume some coffee but only in the morning. 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 He feels fatigued and non restored a couple of mornings, these afternoons are the nap times. He sleeps not longer than 45 minutes, he feels not refreshed  if he naps without CPAP. No history of cervical, ENT or TBI .  Interval history from 09-29-15,  John Potts underwent a home sleep test or out of center test which identified a mild apnea with an AHI of 12.5. The lowest  oxygen saturation that night was 81% but he accumulated 331 minutes of desaturations between 60 and 90%. Remarkably his heart rate was stable throughout the night which could indicate an artifact in oxygen levels as measured by the home sleep test.  For this reason an overnight pulse oximetry was performed and the patient underwent this test on 08/30/2015. Here again the SPO2 state at or below 89% for 2 hours and 59 minutes but he seemed not to have a lot of pulse variation. However,  I would consider this now to be a correct reading and for that reason would like the patient to use CPAP.  A dental device or an ENT surgery are not helpful and hypoxemia correction.  The patient's download from his CPAP machine was obtained today and shows 100% of the last 30 days use over 4 consecutive hours.  He even used the machine 8 hours and 21 minutes on average. He is using auto set, between 5 and 15 cm water pressure with 3 cm expiratory pressure relief. His AHI was 0.9 with is an excellent resolution. He has reached a 95% pressure of 9 cm .  he reports that he wakes more often up at night now, he looks at the clock. It is usually at 2 and  4 AM . He had some gout  and increased the fluid intake, thus having 1 nocturia. No pain. He rarely naps in daytime, when it happens its after lunch. He has no identified primary lung disease.  I need now to further evaluate him for the low oxygen levels while on CPAP, while he is happy with its use and he likes the interface. I will refer this nice gentleman to Dr.  Halford Chessman or Dr. Lamonte Potts.   Interval history from 06/14/2016. John Potts is here today he has a fairly new CPAP and was re-titrated in a CPAP titration on 04/22/2016. The study revealed an optimal pressure at about 10 cm water, at lower pressures there was no longer apnea noted but hypoxia especially during REM sleep. Once the patient reached 10 cm water pressure setting his oxygen nadir rose to 89%. He did not qualify for  oxygen. He was using 2 different masks during this test 1 was a nasal pillow the F10 and another one was a full face mask by [employer]. Both were used with heated humidity  The compliance report for his newly set CPAP at 10 cm water pressure with 3 cm EPR. 100% compliance over the last 30 days with an average user time of 8 hours and 6 minutes. The residual AHI was 2.1. No adjustments have to be made. With the compliance of 100% he cannot improve. His Epworth sleepiness score was today endorsed at 6 points and the fatigue severity score was endorsed at 28 points, the geriatric depression score was endorsed at 3 out of 15 points. No clinical depression evident.  Review of Systems: Out of a complete 14 system review, the patient complains of only the following symptoms, and all other reviewed systems are negative.   Epworth score 6 , Fatigue severity score 28  , depression score 3 , Sinusitis. Rhinophyma.   Social History   Social History  . Marital Status: Married    Spouse Name: N/A  . Number of Children: N/A  . Years of Education: N/A   Occupational History  . retired    Social History Main Topics  . Smoking status: Current Some Day Smoker    Types: Cigars  . Smokeless tobacco: Not on file  . Alcohol Use: 4.2 oz/week    7 Standard drinks or equivalent per week  . Drug Use: No  . Sexual Activity: Not on file   Other Topics Concern  . Not on file   Social History Narrative   Drinks 2 cups of coffee in the morning.    Family History  Problem Relation Age of Onset  . Hypertension Mother   . Hypertension Father     Past Medical History  Diagnosis Date  . Hyperlipidemia   . Kidney stone   . Gout   . OSA on CPAP   . Erectile dysfunction   . Knee pain     Past Surgical History  Procedure Laterality Date  . Lipoma excision    . Cataract extraction, bilateral    . Mohs surgery      Current Outpatient Prescriptions  Medication Sig Dispense Refill  . allopurinol  (ZYLOPRIM) 300 MG tablet Take 300 mg by mouth daily.    Marland Kitchen aspirin 81 MG tablet Take 81 mg by mouth daily.    Marland Kitchen loratadine (CLARITIN) 10 MG tablet Take 10 mg by mouth daily.    Marland Kitchen losartan (COZAAR) 100 MG tablet Take 100 mg by mouth daily.    . Melatonin 3 MG TABS Take 3 mg by mouth daily.    Marland Kitchen  mometasone (NASONEX) 50 MCG/ACT nasal spray Place 2 sprays into the nose daily. 17 g 11  . pravastatin (PRAVACHOL) 20 MG tablet Take 20 mg by mouth daily.    Marland Kitchen scopolamine (TRANSDERM-SCOP) 1 MG/3DAYS Place 1 patch onto the skin every 3 (three) days as needed (for travel).     . sildenafil (VIAGRA) 100 MG tablet Take 40 mg by mouth as needed for erectile dysfunction.     . Tamsulosin HCl (FLOMAX) 0.4 MG CAPS Take by mouth daily.    . verapamil (CALAN) 120 MG tablet TK 1 T PO BID  4   No current facility-administered medications for this visit.    Allergies as of 06/14/2016  . (No Known Allergies)    Vitals: BP 138/78 mmHg  Pulse 80  Resp 20  Ht 5\' 8"  (1.727 m)  Wt 155 lb (70.308 kg)  BMI 23.57 kg/m2 Last Weight:  Wt Readings from Last 1 Encounters:  06/14/16 155 lb (70.308 kg)       Last Height:   Ht Readings from Last 1 Encounters:  06/14/16 5\' 8"  (1.727 m)    Physical exam:  General: The patient is awake, alert and appears not in acute distress. The patient is well groomed. Head: Normocephalic, atraumatic. Neck is supple. Mallampati2 , left deviated uvula, ,  neck circumference: 16 . Nasal airflow unrestricted- but he reports post nasal drip at night.  , TMJ is not  evident . Retrognathia is seen.  Cardiovascular:  Regular rate and rhythm , without  murmurs or carotid bruit, and without distended neck veins. Respiratory: Lungs are clear to auscultation. Skin:  Without evidence of edema, or rash Trunk: BMI is not  elevated and patient  has normal posture.  Neurologic exam : The patient is awake and alert, oriented to place and time.   Memory subjective  described as intact. There is  a normal attention span & concentration ability. Speech is fluent without  dysarthria, dysphonia or aphasia. Mood and affect are appropriate.  Cranial nerves: Pupils are equal and briskly reactive to light. Funduscopic exam without  evidence of pallor , status post cataract surgery.  Extraocular movements  in vertical and horizontal planes intact and without nystagmus. Visual fields by finger perimetry are intact. Hearing to finger rub intact.  Facial sensation intact to fine touch. Facial motor strength is symmetric and tongue and uvula move midline.  Motor exam: Normal tone ,muscle bulk and symmetric ,strength in all extremities.    Assessment:  After physical and neurologic examination, review of laboratory studies, imaging, neurophysiology testing and pre-existing records,  25 minute assessment is    OSA , required CPAP  due to hypoxemia. Hypoxemia persists on CPAP at 7 cm water , but was much improved on 10 cm water. FFM .  I was able to obtain a download from John Potts's CPAP machine. Visit duration was 25 minutes. More than 50 % of today's face to face visit time were dedicated to the causes of Hypoxemia in a patient with seasonal coughing , but no COPD or asthma diagnosis has officially been established. But he suffers frequently from Bronchitis/ Sinusitis . He is again relapsed into smoking, 5-6 cigarettes a day.   Plan:  Treatment plan and additional workup :I will follow John Potts once a year for his CPAP compliance, now 100%. Smoking cessation discussed. Oxygen seems not needed. He can change to auto PAP   [facility] is his DME.    Asencion Partridge Cerinity Zynda MD  06/14/2016  Cc; Leanna Battles, MD

## 2016-09-28 ENCOUNTER — Ambulatory Visit (INDEPENDENT_AMBULATORY_CARE_PROVIDER_SITE_OTHER): Payer: Medicare Other | Admitting: Neurology

## 2016-09-28 ENCOUNTER — Encounter: Payer: Self-pay | Admitting: Neurology

## 2016-09-28 VITALS — BP 120/86 | HR 68 | Resp 20 | Ht 68.0 in | Wt 159.0 lb

## 2016-09-28 DIAGNOSIS — Z9989 Dependence on other enabling machines and devices: Secondary | ICD-10-CM

## 2016-09-28 DIAGNOSIS — G4719 Other hypersomnia: Secondary | ICD-10-CM | POA: Insufficient documentation

## 2016-09-28 DIAGNOSIS — G4733 Obstructive sleep apnea (adult) (pediatric): Secondary | ICD-10-CM | POA: Diagnosis not present

## 2016-09-28 NOTE — Progress Notes (Signed)
SLEEP MEDICINE CLINIC   Provider:  Larey Seat, M D  Referring Provider: Leanna Battles, MD Primary Care Physician:  Donnajean Lopes, MD  Chief Complaint  Patient presents with  . Follow-up    cpap    HPI:  John Potts is a 70 y.o. male, seen here as a referral from Dr. Philip Aspen for a sleep consultation,  I see John Potts today for the first time after about 7 years and he was referred originally in early 2009 for evaluation of possible sleep apnea and  diagnosed with sleep apnea and titrated to CPAP 9 cm water - His titration dated from 08-10-2008. John Potts now reports that he feels tired and fatigued all the time not necessarily sleepy but that there is a significant decrease in his energy.  His baseline study he had an apnea-hypotony index of 25.4 at the body mass index of 26 and a neck circumference of 15.25 inches. He had endorsed the Epworth sleepiness score in 2009 at only 2 points. John Potts retired in 2013, he goes to bed at 9.30 PM.  Usually falls asleep promptly. He likes to watch some TV, usually not in bed. His Dog, John Potts, wakes him at 6 AM.  He has no nocturia , no witnessed  Snoring on CPAP. He reports to dream, but not to act out, is no report of sleepwalking, sleep talking, sleep eating. He sometimes a takes a nap in PM , and his wife noted him to snore. He does consume some coffee but only in the morning. 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 He feels fatigued and non restored a couple of mornings, these afternoons are the nap times. He sleeps not longer than 45 minutes, he feels not refreshed  if he naps without CPAP. No history of cervical, ENT or TBI .  Interval history from 09-29-15,  John Potts underwent a home sleep test or out of center test which identified a mild apnea with an AHI of 12.5. The lowest oxygen saturation that night was 81% but he accumulated 331 minutes of desaturations  between 60 and 90%. Remarkably his heart rate was stable throughout the night which could indicate an artifact in oxygen levels as measured by the home sleep test.  For this reason an overnight pulse oximetry was performed and the patient underwent this test on 08/30/2015. Here again the SPO2 state at or below 89% for 2 hours and 59 minutes but he seemed not to have a lot of pulse variation. However,  I would consider this now to be a correct reading and for that reason would like the patient to use CPAP.  A dental device or an ENT surgery are not helpful and hypoxemia correction.  The patient's download from his CPAP machine was obtained today and shows 100% of the last 30 days use over 4 consecutive hours.  He even used the machine 8 hours and 21 minutes on average. He is using auto set, between 5 and 15 cm water pressure with 3 cm expiratory pressure relief. His AHI was 0.9 with is an excellent resolution. He has reached a 95% pressure of 9 cm .  he reports that he wakes more often up at night now, he looks at the clock. It is usually at 2 and  4 AM . He had some gout and increased the fluid intake, thus having 1 nocturia. No pain. He rarely  naps in daytime, when it happens its after lunch. He has no identified primary lung disease.  I need now to further evaluate him for the low oxygen levels while on CPAP, while he is happy with its use and he likes the interface. I will refer this nice gentleman to Dr.  Halford Chessman or Dr. Lamonte Sakai.   Interval history from 06/14/2016. John Potts is here today he has a fairly new CPAP and was re-titrated in a CPAP titration on 04/22/2016. The study revealed an optimal pressure at about 10 cm water, at lower pressures there was no longer apnea noted but hypoxia especially during REM sleep. Once the patient reached 10 cm water pressure setting his oxygen nadir rose to 89%. He did not qualify for oxygen. He was using 2 different masks during this test 1 was a nasal pillow the F10 and  another one was a full face mask by [employer]. Both were used with heated humidity  The compliance report for his newly set CPAP at 10 cm water pressure with 3 cm EPR. 100% compliance over the last 30 days with an average user time of 8 hours and 6 minutes. The residual AHI was 2.1. No adjustments have to be made. With the compliance of 100% he cannot improve. His Epworth sleepiness score was today endorsed at 6 points and the fatigue severity score was endorsed at 28 points, the geriatric depression score was endorsed at 3 out of 15 points. No clinical depression evident.   History from 09/28/2016, He still feels very tired but is a very compliant CPAP user. He is starting to take naps again in the afternoon as a duration of 30-40 minutes. He has no nocturia. He drinks bourbon each night.  He wonders if a slight bump up in pressure could help him. The patient has been using his CPAP 400% of the last 30 days over 4 hours with an average user time of 8 hours 35 minutes, the machine is set at 10 cm water pressure with 3 cm EPR, the residual AHI is ideal at 2.3 and he has very little air leak. What I'm going to offer him is to set the pressure at 11 cm and reduce the EPR to 2 cm water his Epworth sleepiness score and his fatigue severity score is still below average.  Review of Systems: Out of a complete 14 system review, the patient complains of only the following symptoms, and all other reviewed systems are negative.   Epworth score 6 , Fatigue severity score 27  , geriatric  depression score 3 , Sinusitis. Rhinophyma.   Social History   Social History  . Marital status: Married    Spouse name: N/A  . Number of children: N/A  . Years of education: N/A   Occupational History  . retired    Social History Main Topics  . Smoking status: Current Some Day Smoker    Types: Cigars  . Smokeless tobacco: Not on file  . Alcohol use 4.2 oz/week    7 Standard drinks or equivalent per week  . Drug use:  No  . Sexual activity: Not on file   Other Topics Concern  . Not on file   Social History Narrative   Drinks 2 cups of coffee in the morning.    Family History  Problem Relation Age of Onset  . Hypertension Mother   . Hypertension Father     Past Medical History:  Diagnosis Date  . Erectile dysfunction   . Gout   .  Hyperlipidemia   . Kidney stone   . Knee pain   . OSA on CPAP     Past Surgical History:  Procedure Laterality Date  . CATARACT EXTRACTION, BILATERAL    . LIPOMA EXCISION    . MOHS SURGERY      Current Outpatient Prescriptions  Medication Sig Dispense Refill  . allopurinol (ZYLOPRIM) 300 MG tablet Take 300 mg by mouth daily.    Marland Kitchen aspirin 81 MG tablet Take 81 mg by mouth daily.    Marland Kitchen loratadine (CLARITIN) 10 MG tablet Take 10 mg by mouth daily.    Marland Kitchen losartan (COZAAR) 100 MG tablet Take 100 mg by mouth daily.    . Melatonin 3 MG TABS Take 3 mg by mouth daily.    . metoprolol succinate (TOPROL-XL) 25 MG 24 hr tablet Take 25 mg by mouth daily.    . mometasone (NASONEX) 50 MCG/ACT nasal spray Place 2 sprays into the nose daily. 17 g 11  . pravastatin (PRAVACHOL) 20 MG tablet Take 20 mg by mouth daily.    Marland Kitchen scopolamine (TRANSDERM-SCOP) 1 MG/3DAYS Place 1 patch onto the skin every 3 (three) days as needed (for travel).     . sildenafil (VIAGRA) 100 MG tablet Take 40 mg by mouth as needed for erectile dysfunction.     . Tamsulosin HCl (FLOMAX) 0.4 MG CAPS Take by mouth daily.     No current facility-administered medications for this visit.     Allergies as of 09/28/2016  . (No Known Allergies)    Vitals: BP 120/86   Pulse 68   Resp 20   Ht 5\' 8"  (1.727 m)   Wt 159 lb (72.1 kg)   BMI 24.18 kg/m  Last Weight:  Wt Readings from Last 1 Encounters:  09/28/16 159 lb (72.1 kg)       Last Height:   Ht Readings from Last 1 Encounters:  09/28/16 5\' 8"  (1.727 m)    Physical exam:  General: The patient is awake, alert and appears not in acute distress.  The patient is well groomed. Head: Normocephalic, atraumatic. Neck is supple. Mallampati2 , left deviated uvula, ,  neck circumference: 16 . Nasal airflow unrestricted- but he reports post nasal drip at night.  , TMJ is not  evident . Retrognathia is seen.  Cardiovascular:  Regular rate and rhythm , without  murmurs or carotid bruit, and without distended neck veins. Respiratory: Lungs are clear to auscultation. Skin:  Without evidence of edema, or rash Trunk: BMI is not  elevated and patient  has normal posture.  Neurologic exam : The patient is awake and alert, oriented to place and time.   Memory subjective  described as intact. There is a normal attention span & concentration ability. Speech is fluent without  dysarthria, dysphonia or aphasia. Mood and affect are appropriate.  Cranial nerves: Pupils are equal and briskly reactive to light.  Visual fields by finger perimetry are intact. Hearing to finger rub intact.  Facial sensation intact to fine touch. Facial motor strength is symmetric and tongue and uvula move midline.  Motor exam: Normal tone, muscle bulk and symmetric ,strength in all extremities.  Assessment: 15 minute visit.   After physical and neurologic examination, review of laboratory studies, imaging, neurophysiology testing and pre-existing records,  25 minute assessment is    OSA , required CPAP due to hypoxemia. Hypoxemia persisted on CPAP at 7 cm water , but was much improved on 10 cm water. FFM .  I will  follow John. Sport once a year for his CPAP compliance, now 100%.  Smoking cessation discussed. Oxygen seems not needed.  Will increase the pressure to 11 cm and reduce EPR by one cm water.   [facility] is his DME.    Asencion Partridge Tremon Sainvil MD  09/28/2016   Cc; Leanna Battles, MD

## 2017-04-29 ENCOUNTER — Other Ambulatory Visit: Payer: Self-pay | Admitting: Internal Medicine

## 2017-04-29 DIAGNOSIS — K4091 Unilateral inguinal hernia, without obstruction or gangrene, recurrent: Secondary | ICD-10-CM

## 2017-05-04 ENCOUNTER — Ambulatory Visit
Admission: RE | Admit: 2017-05-04 | Discharge: 2017-05-04 | Disposition: A | Discharge status: Home / Self Care | Payer: Medicare Other | Source: Ambulatory Visit | Attending: Internal Medicine | Admitting: Internal Medicine

## 2017-05-04 DIAGNOSIS — K4091 Unilateral inguinal hernia, without obstruction or gangrene, recurrent: Secondary | ICD-10-CM

## 2017-05-04 MED ORDER — IOPAMIDOL (ISOVUE-300) INJECTION 61%
100.0000 mL | Freq: Once | INTRAVENOUS | Status: AC | PRN
  Administered 2017-05-04: 100 mL via INTRAVENOUS

## 2017-05-10 ENCOUNTER — Other Ambulatory Visit: Payer: Medicare Other

## 2017-05-11 ENCOUNTER — Other Ambulatory Visit (HOSPITAL_COMMUNITY): Payer: Self-pay | Admitting: Internal Medicine

## 2017-05-11 DIAGNOSIS — M899 Disorder of bone, unspecified: Secondary | ICD-10-CM

## 2017-05-13 ENCOUNTER — Encounter: Payer: Self-pay | Admitting: Internal Medicine

## 2017-05-13 ENCOUNTER — Ambulatory Visit (INDEPENDENT_AMBULATORY_CARE_PROVIDER_SITE_OTHER): Payer: Medicare Other | Admitting: Internal Medicine

## 2017-05-13 VITALS — BP 124/74 | HR 88 | Ht 68.0 in | Wt 147.8 lb

## 2017-05-13 DIAGNOSIS — J310 Chronic rhinitis: Secondary | ICD-10-CM

## 2017-05-13 DIAGNOSIS — F17210 Nicotine dependence, cigarettes, uncomplicated: Secondary | ICD-10-CM | POA: Diagnosis not present

## 2017-05-13 DIAGNOSIS — R918 Other nonspecific abnormal finding of lung field: Secondary | ICD-10-CM

## 2017-05-13 DIAGNOSIS — G4734 Idiopathic sleep related nonobstructive alveolar hypoventilation: Secondary | ICD-10-CM

## 2017-05-13 MED ORDER — AMOXICILLIN-POT CLAVULANATE 875-125 MG PO TABS: 1.0000 | ORAL_TABLET | Freq: Two times a day (BID) | ORAL | 0 refills | Status: AC

## 2017-05-13 NOTE — Progress Notes (Signed)
Subjective:     Patient ID: John Potts, male   DOB: [birth year],    MRN: [id number]    Brief patient profile:  47 yowm quit all smoking 04/13/17  with chronic nasal congestion with sensation of pnds x 2006 followed by Dr Maureen Chatters on cpap since the same time but 08/30/15  ono on cpap desaturated  so referred to pulmonary clinic 10/07/2015 with nl pfts and after adjustment of CPAP by Dr Maureen Chatters  had no desats  referred to pulmonary clinic 05/13/2017 by Dr   Philip Aspen for abn ct abd showing GG changes on 05/04/17     History of Present Illness   05/13/2017    ov/John Potts re: stopped cigar smoking one month prior to [place]   Chief Complaint  Patient presents with  . Pulmonary Consult    Seen here in the past for Nocturnal Hypoxenmia, last in Nov 2016. He is referred back today  by Dr. Leanna Battles for eval fo abnormal CT Abdomen. He states scan was done to eval hernia. He has had some cough recently b/c he has had a cold. His cough is occ prod with clear to white sputum.     his concern was R inguinal swelling > CT abd c/w hydrocele on R but GG changes both lower lobes (very mild) Only symptom he had was really bad chest and head cold for several weeks with lingering am cough with minimal discolored mucus but no sob/ mucus plugs / hemoptysis  No obvious day to day or daytime variability or assoc  cp or chest tightness, subjective wheeze or overt sinus or hb symptoms. No unusual exp hx or h/o childhood pna/ asthma or knowledge of premature birth.  Sleeping ok without nocturnal   exacerbation  of respiratory  c/o's or need for noct saba. Also denies any obvious fluctuation of symptoms with weather or environmental changes or other aggravating or alleviating factors except as outlined above   Current Medications, Allergies, Complete Past Medical History, Past Surgical History, Family History, and Social History were reviewed in [facility] record.  ROS  The following are not active  complaints unless bolded sore throat, dysphagia, dental problems, itching, sneezing,  nasal congestion or excess/ purulent secretions, ear ache,   fever, chills, sweats, unintended wt loss, classically pleuritic or exertional cp,  orthopnea pnd or leg swelling, presyncope, palpitations, abdominal pain, anorexia, nausea, vomiting, diarrhea  or change in bowel or bladder habits, change in stools or urine, dysuria,hematuria,  rash, arthralgias, visual complaints, headache, numbness, weakness or ataxia or problems with walking or coordination,  change in mood/affect or memory.                Objective:   Physical Exam    amb wm nad  05/13/2017        147  11/12/2015      165     10/07/15 169 lb 12.8 oz (77.021 kg)  09/29/15 169 lb (76.658 kg)  07/21/15 174 lb (78.926 kg)    Vital signs reviewed  - Note on arrival 02 sats  95% on RA   HEENT: nl dentition, turbinates bilaterally, and oropharynx. Nl external ear canals without cough reflex   NECK :  without JVD/Nodes/TM/ nl carotid upstrokes bilaterally   LUNGS: no acc muscle use,  Nl contour chest with insp pops and squeaks in both bases/rattling cough on insp    CV:  RRR  no s3 or murmur or increase in P2, and no edema  ABD:  soft and nontender with nl inspiratory excursion in the supine position. No bruits or organomegaly appreciated, bowel sounds nl  MS:  Nl gait/ ext warm without deformities, calf tenderness, cyanosis or clubbing No obvious joint restrictions   SKIN: warm and dry without lesions    NEURO:  alert, approp, nl sensorium with  no motor or cerebellar deficits apparent.                     Assessment:

## 2017-05-13 NOTE — Patient Instructions (Signed)
Augmentin 875 mg take one pill twice daily  X 10 days - take at breakfast and supper with large glass of water.  It would help reduce the usual side effects (diarrhea and yeast infections) if you ate cultured yogurt at lunch.   Keep appt for the scans and I will review them when available    For cough /congestion take mucinex dm up 1200 mg every 12h as needed   Please schedule a follow up office visit in 6 weeks, call sooner if needed with pfts on return

## 2017-05-14 ENCOUNTER — Encounter: Payer: Self-pay | Admitting: Internal Medicine

## 2017-05-14 DIAGNOSIS — R918 Other nonspecific abnormal finding of lung field: Secondary | ICD-10-CM | POA: Insufficient documentation

## 2017-05-14 NOTE — Assessment & Plan Note (Signed)
Allergy profile 11/12/2015 >>  Eos 0.4/ IgE 23 neg RAST  Sinus CT 11/17/2015 > thickening only, no a/f levels  Mild flare attributed to "head cold" with lingering cough ? Could it be sinusitis > rx Augmentin 875 mg take one pill twice daily  X 10 days - repeat sinus ct and HRCT chest if cough persists.

## 2017-05-14 NOTE — Assessment & Plan Note (Signed)
See CT abd 05/04/17 whispy focal  GG changes R  Base  (full ct chest not done)  His exam is really more impressive that the ct with lingering cough p "head and chest cold" he probably has an area of low grade bronchopna ? Related to sinus dz so rec rx augmentin then f/u with HRCT chest and sinus ct in 6 weeks if not 100% better.  I had an extended discussion with the patient reviewing all relevant studies completed to date and  lasting 25 minutes of a 40  minute office  visit to re-establish     re   non-specific but potentially very serious refractory respiratory symptoms of uncertain and potentially multiple  etiologies.   Each maintenance medication was reviewed in detail including most importantly the difference between maintenance and prns and under what circumstances the prns are to be triggered using an action plan format that is not reflected in the computer generated alphabetically organized AVS.    Please see AVS for specific instructions unique to this office visit that I personally wrote and verbalized to the the pt in detail and then reviewed with pt  by my nurse highlighting any changes in therapy/plan of care  recommended at today's visit.

## 2017-05-14 NOTE — Assessment & Plan Note (Signed)
ono on Cpap 08/30/15 >  sats < 89% x 1: 30m - 10/07/2015  Walked RA x 3 laps @ 185 ft each stopped due to  End of study, nl pace, no sob or desat   - 11/12/2015 pfts wnl  - ono RA 11/24/15  desat < 89% only 3.9 min so no 02 needed   F/u with Dr Maureen Chatters planned, no further pulmonary input needed

## 2017-05-14 NOTE — Assessment & Plan Note (Signed)
Reports quit all smoking x one month, encouraged     I reviewed the Fletcher curve with the patient that basically indicates  if you quit smoking when your best day FEV1 is still well preserved (as is clearly  the case here)  it is highly unlikely you will progress to severe disease and informed the patient there was  no medication on the market that has proven to alter the curve/ its downward trajectory  or the likelihood of progression of their disease(unlike other chronic medical conditions such as atheroclerosis where we do think we can change the natural hx with risk reducing meds)    Therefore stopping smoking and maintaining abstinence is the most important aspect of care, not choice of inhalers or for that matter, doctors.    Pulmonary f/u can be prn

## 2017-05-16 ENCOUNTER — Encounter (HOSPITAL_COMMUNITY)
Admission: RE | Admit: 2017-05-16 | Discharge: 2017-05-16 | Disposition: A | Discharge status: Home / Self Care | Payer: Medicare Other | Source: Ambulatory Visit | Attending: Internal Medicine | Admitting: Internal Medicine

## 2017-05-16 DIAGNOSIS — M899 Disorder of bone, unspecified: Secondary | ICD-10-CM | POA: Diagnosis not present

## 2017-05-16 MED ORDER — TECHNETIUM TC 99M MEDRONATE IV KIT
21.5000 | PACK | Freq: Once | INTRAVENOUS | Status: AC | PRN
  Administered 2017-05-16: 21.5 via INTRAVENOUS

## 2017-06-14 ENCOUNTER — Ambulatory Visit: Payer: Medicare Other | Admitting: Adult Health

## 2017-06-23 ENCOUNTER — Other Ambulatory Visit: Payer: Self-pay | Admitting: Internal Medicine

## 2017-06-23 ENCOUNTER — Telehealth: Payer: Self-pay | Admitting: Internal Medicine

## 2017-06-23 DIAGNOSIS — R918 Other nonspecific abnormal finding of lung field: Secondary | ICD-10-CM

## 2017-06-23 NOTE — Telephone Encounter (Signed)
Disregard this entry 

## 2017-06-24 ENCOUNTER — Ambulatory Visit (INDEPENDENT_AMBULATORY_CARE_PROVIDER_SITE_OTHER)
Admission: RE | Admit: 2017-06-24 | Discharge: 2017-06-24 | Disposition: A | Discharge status: Home / Self Care | Payer: Medicare Other | Source: Ambulatory Visit | Attending: Internal Medicine | Admitting: Internal Medicine

## 2017-06-24 ENCOUNTER — Ambulatory Visit (INDEPENDENT_AMBULATORY_CARE_PROVIDER_SITE_OTHER): Payer: Medicare Other | Admitting: Internal Medicine

## 2017-06-24 ENCOUNTER — Encounter: Payer: Self-pay | Admitting: Internal Medicine

## 2017-06-24 VITALS — BP 130/70 | HR 68 | Ht 66.5 in | Wt 152.0 lb

## 2017-06-24 DIAGNOSIS — R918 Other nonspecific abnormal finding of lung field: Secondary | ICD-10-CM

## 2017-06-24 DIAGNOSIS — J310 Chronic rhinitis: Secondary | ICD-10-CM | POA: Diagnosis not present

## 2017-06-24 DIAGNOSIS — F17210 Nicotine dependence, cigarettes, uncomplicated: Secondary | ICD-10-CM

## 2017-06-24 LAB — PULMONARY FUNCTION TEST
DL/VA % PRED: 88 %
DL/VA: 3.88 ml/min/mmHg/L
DLCO UNC % PRED: 89 %
DLCO cor % pred: 89 %
DLCO cor: 24.66 ml/min/mmHg
DLCO unc: 24.66 ml/min/mmHg
FEF 25-75 PRE: 4.06 L/s
FEF 25-75 Post: 4.54 L/sec
FEF2575-%CHANGE-POST: 11 %
FEF2575-%Pred-Post: 210 %
FEF2575-%Pred-Pre: 188 %
FEV1-%Change-Post: 4 %
FEV1-%PRED-PRE: 135 %
FEV1-%Pred-Post: 141 %
FEV1-Post: 3.99 L
FEV1-Pre: 3.81 L
FEV1FVC-%CHANGE-POST: 3 %
FEV1FVC-%Pred-Pre: 109 %
FEV6-%CHANGE-POST: 1 %
FEV6-%PRED-POST: 130 %
FEV6-%PRED-PRE: 129 %
FEV6-POST: 4.74 L
FEV6-PRE: 4.68 L
FEV6FVC-%Change-Post: 0 %
FEV6FVC-%PRED-POST: 106 %
FEV6FVC-%PRED-PRE: 106 %
FVC-%Change-Post: 1 %
FVC-%PRED-POST: 123 %
FVC-%Pred-Pre: 121 %
FVC-Post: 4.74 L
FVC-Pre: 4.7 L
Post FEV1/FVC ratio: 84 %
Post FEV6/FVC ratio: 100 %
Pre FEV1/FVC ratio: 81 %
Pre FEV6/FVC Ratio: 100 %
RV % PRED: -23 %
RV: -0.52 L
TLC % PRED: 83 %
TLC: 5.27 L

## 2017-06-24 NOTE — Assessment & Plan Note (Signed)
Reports quit about 09/27/15 > replaced with cigars  - pfts s obst 06/24/2017   Successfully quit cigs but occ cigars     I reviewed the Fletcher curve with the patient that basically indicates  if you quit smoking when your best day FEV1 is still well preserved (as is clearly  the case here)  it is highly unlikely you will progress to severe disease and informed the patient there was  no medication on the market that has proven to alter the curve/ its downward trajectory  or the likelihood of progression of their disease(unlike other chronic medical conditions such as atheroclerosis where we do think we can change the natural hx with risk reducing meds)    Therefore stopping smoking and maintaining abstinence is the most important aspect of care, not choice of inhalers or for that matter, doctors.

## 2017-06-24 NOTE — Progress Notes (Signed)
Subjective:     Patient ID: John Potts, male   DOB: 1946/02/22,    MRN: [id number]    Brief patient profile:  69 yowm quit all smoking 04/13/17  with chronic nasal congestion with sensation of pnds x 2006 followed by Dr John Potts on cpap since the same time but 08/30/15  ono on cpap desaturated  so referred to pulmonary clinic 10/07/2015 with nl pfts and after adjustment of CPAP by Dr John Potts  had no desats  referred to pulmonary clinic 05/13/2017 by Dr   John Potts for abn ct abd showing GG changes on 05/04/17     History of Present Illness   05/13/2017    ov/[name] re: stopped cigar smoking one month prior to John Potts   Chief Complaint  Patient presents with  . Pulmonary Consult    Seen here in the past for Nocturnal Hypoxenmia, last in Nov 2016. He is referred back today  by Dr. Leanna Potts for eval fo abnormal CT Abdomen. He states scan was done to eval hernia. He has had some cough recently b/c he has had a cold. His cough is occ prod with clear to white sputum.     his concern was R inguinal swelling > CT abd c/w hydrocele on R but GG changes both lower lobes (very mild) Only symptom he had was really bad chest and head cold for several weeks with lingering am cough with minimal discolored mucus but no sob/ mucus plugs / hemoptysis rec Augmentin 875 mg take one pill twice daily  X 10 days - take at breakfast and supper with large glass of water.  It would help reduce the usual side effects (diarrhea and yeast infections) if you ate cultured yogurt at lunch.  Keep appt for the scans and I will review them when available   For cough /congestion take mucinex dm up 1200 mg every 12h as needed     06/24/2017  f/u ov/John Potts re:  COPD GOLD 0/ whispy gg changes on CT abd  05/04/17 esp on R LL Chief Complaint  Patient presents with  . Follow-up    PFT's done today. Breathing is unchanged. He has occ PND and cough.    Not limited by breathing from desired activities   Still intermittent nasal  congestion/ drainage but s excess/ purulent sputum or mucus plugs    No obvious day to day or daytime variability or assoc  Hemoptysis or cp or chest tightness, subjective wheeze or overt sinus or hb symptoms. No unusual exp hx or h/o childhood pna/ asthma or knowledge of premature birth.  Sleeping ok without nocturnal  or early am exacerbation  of respiratory  c/o's or need for noct saba. Also denies any obvious fluctuation of symptoms with weather or environmental changes or other aggravating or alleviating factors except as outlined above   Current Medications, Allergies, Complete Past Medical History, Past Surgical History, Family History, and Social History were reviewed in [employer] [facility] record.  ROS  The following are not active complaints unless bolded sore throat, dysphagia, dental problems, itching, sneezing,  nasal congestion or excess/ purulent secretions, ear ache,   fever, chills, sweats, unintended wt loss, classically pleuritic or exertional cp, hemoptysis,  orthopnea pnd or leg swelling, presyncope, palpitations, abdominal pain, anorexia, nausea, vomiting, diarrhea  or change in bowel or bladder habits, change in stools or urine, dysuria,hematuria,  rash, arthralgias, visual complaints, headache, numbness, weakness or ataxia or problems with walking or coordination,  change in mood/affect  or memory.                Objective:  Physical Exam    amb wm nad with nasal tone to voice, occ sniffling   06/24/2017        152  05/13/2017        147  11/12/2015      165     10/07/15 169 lb 12.8 oz (77.021 kg)  09/29/15 169 lb (76.658 kg)  07/21/15 174 lb (78.926 kg)    Vital signs reviewed  - Note on arrival 02 sats  95% on RA   HEENT: nl dentition, turbinates bilaterally, and oropharynx. Nl external ear canals without cough reflex   NECK :  without JVD/Nodes/TM/ nl carotid upstrokes bilaterally   LUNGS: no acc muscle use,  Nl contour chest with very  minimal insp and exp rhonchi bilaterally mostly upper airway    CV:  RRR  no s3 or murmur or increase in P2, and no edema   ABD:  soft and nontender with nl inspiratory excursion in the supine position. No bruits or organomegaly appreciated, bowel sounds nl  MS:  Nl gait/ ext warm without deformities, calf tenderness, cyanosis or clubbing No obvious joint restrictions   SKIN: warm and dry without lesions    NEURO:  alert, approp, nl sensorium with  no motor or cerebellar deficits apparent.          CXR PA and Lateral:   06/24/2017 :    I personally reviewed images and agree with radiology impression as follows:   1. Worsening bilateral infrahilar opacities favored to represent atelectasis. 2. Similar findings of lung hyperexpansion and chronic bronchitic Change. My impression:  Very minimal streaky atx changes on L with nothing convincing on R               Assessment:

## 2017-06-24 NOTE — Progress Notes (Signed)
PFT completed today.John Potts,CMA  

## 2017-06-24 NOTE — Patient Instructions (Addendum)
Please remember to go to the  x-ray department downstairs in the basement  for your tests - we will call you with the results when they are available.  Be sure to keep up with Dr Janace Hoard if your sinuses continue to bother you   Your lung function is excellent and likely to stay that way if you maintain off cigarettes - good luck!!       If you are satisfied with your treatment plan,  let your doctor know and he/she can either refill your medications or you can return here when your prescription runs out.     If in any way you are not 100% satisfied,  please tell us.  If 100% better, tell your friends!  Pulmonary follow up is as needed   Add:  HRCT to be done p completion of ent rx (reminder file for 3 m)

## 2017-06-24 NOTE — Assessment & Plan Note (Addendum)
Allergy profile 11/12/2015 >>  Eos 0.4/ IgE 23 neg RAST  Sinus CT 11/17/2015 > thickening only, no a/f levels  Already on nasonex/ freq flares >> F/u ent prn

## 2017-06-24 NOTE — Assessment & Plan Note (Addendum)
See CT abd 05/04/17 whispy focal  GG changes R  Base  (full ct chest not done) - PFT's  06/24/2017  FEV1 3.99 (141 % ) ratio 84  p 4 % improvement from saba p nothing prior to study with DLCO  89 % corrects to 88  % for alv volume    I had an extended final summary discussion with the patient reviewing all relevant studies completed to date and  lasting 15 to 20 minutes of a 25 minute visit on the following issues:   Marked improvement p empirical rx with augmentin and pred last ov with minimal lingering upper airway cough and nasal congestion and plans to f/u with his ENT of record   Radiology still concerned re atx changes in bases which may suggest some mucus plugging related to aspiration of nasal drainage nocturnally or even bronchiectasis so I rec HRCT to be done p ENT issues are addressed  Discussed in detail all the  indications, usual  risks and alternatives  relative to the benefits with patient who agrees to proceed with conservative f/u as outlined     Each maintenance medication was reviewed in detail including most importantly the difference between maintenance and as needed and under what circumstances the prns are to be used.  Please see AVS for specific  Instructions which are unique to this visit and I personally typed out  which were reviewed in detail in writing with the patient and a copy provided.

## 2017-06-27 ENCOUNTER — Other Ambulatory Visit: Payer: Self-pay | Admitting: Internal Medicine

## 2017-06-27 DIAGNOSIS — R918 Other nonspecific abnormal finding of lung field: Secondary | ICD-10-CM

## 2017-06-27 NOTE — Progress Notes (Signed)
Spoke with pt and notified of results per Dr. Wert. Pt verbalized understanding and denied any questions. 

## 2017-09-27 ENCOUNTER — Ambulatory Visit (INDEPENDENT_AMBULATORY_CARE_PROVIDER_SITE_OTHER)
Admission: RE | Admit: 2017-09-27 | Discharge: 2017-09-27 | Disposition: A | Discharge status: Home / Self Care | Payer: Medicare Other | Source: Ambulatory Visit | Attending: Internal Medicine | Admitting: Internal Medicine

## 2017-09-27 DIAGNOSIS — R918 Other nonspecific abnormal finding of lung field: Secondary | ICD-10-CM

## 2017-10-03 NOTE — Progress Notes (Signed)
Spoke with pt and notified of results per Dr. Wert. Pt verbalized understanding and denied any questions. 

## 2017-10-04 ENCOUNTER — Ambulatory Visit: Payer: Medicare Other | Admitting: Adult Health

## 2017-10-06 ENCOUNTER — Telehealth: Payer: Self-pay | Admitting: *Deleted

## 2017-10-06 ENCOUNTER — Encounter: Payer: Self-pay | Admitting: Adult Health

## 2017-10-06 ENCOUNTER — Ambulatory Visit (INDEPENDENT_AMBULATORY_CARE_PROVIDER_SITE_OTHER): Payer: Medicare Other | Admitting: Adult Health

## 2017-10-06 VITALS — BP 134/76 | HR 81 | Wt 155.0 lb

## 2017-10-06 DIAGNOSIS — Z9989 Dependence on other enabling machines and devices: Secondary | ICD-10-CM

## 2017-10-06 DIAGNOSIS — G4733 Obstructive sleep apnea (adult) (pediatric): Secondary | ICD-10-CM | POA: Diagnosis not present

## 2017-10-06 NOTE — Telephone Encounter (Signed)
Called patient to reschedule his follow up today due to provider leaving sick. Wife stated he is on his way. She will try to reach him on his cell phone. Advised wife we will call later to reschedule him.

## 2017-10-06 NOTE — Patient Instructions (Signed)
Your Plan:  Continue using CPAP- will switch to autoset 5-15 If your symptoms worsen or you develop new symptoms please let us know.   Thank you for coming to see [place] at [facility]. I hope we have been able to provide you high quality care today.  You may receive a patient satisfaction survey over the next few weeks. We would appreciate your feedback and comments so that we may continue to improve ourselves and the health of our patients.

## 2017-10-06 NOTE — Telephone Encounter (Signed)
Patient had already arrived for his appt. Patient was asked if he minded seeing the NP and he was agreeable to this. I moved his appointment to Ward Givens, NP.

## 2017-10-06 NOTE — Progress Notes (Signed)
PATIENT: John Potts DOB: 1946-09-16  REASON FOR VISIT: follow up-obstructive sleep apnea on CPAP HISTORY FROM: patient  HISTORY OF PRESENT ILLNESS: Today 10/06/17 John Potts is a 71 year old male with a history of obstructive sleep apnea on CPAP. He returns today for a compliance download. His download indicates that he uses machine 30 out of 30 days for compliance of 100%. On average he uses his machine 8 hours and 47 minutes. His residual AHI is 3.6 on 11 cm water with EPR 2. He does not have a significant leak. He reports overall he is tolerating the machine. He does state that there are some nights he feels that he could use more pressure. He states that his wife uses AutoSet and he feels that that may be beneficial for him. He returns today for an evaluation.  HISTORY 09/28/16: He still feels very tired but is a very compliant CPAP user. He is starting to take naps again in the afternoon as a duration of 30-40 minutes. He has no nocturia. He drinks bourbon each night.  He wonders if a slight bump up in pressure could help him. The patient has been using his CPAP 400% of the last 30 days over 4 hours with an average user time of 8 hours 35 minutes, the machine is set at 10 cm water pressure with 3 cm EPR, the residual AHI is ideal at 2.3 and he has very little air leak. What I'm going to offer him is to set the pressure at 11 cm and reduce the EPR to 2 cm water his Epworth sleepiness score and his fatigue severity score is still below average.   REVIEW OF SYSTEMS: Out of a complete 14 system review of symptoms, the patient complains only of the following symptoms, and all other reviewed systems are negative.  See HPI  ALLERGIES: Allergies  Allergen Reactions  . Wellbutrin [Bupropion]     Fatigue, increased BP     HOME MEDICATIONS: Outpatient Medications Prior to Visit  Medication Sig Dispense Refill  . allopurinol (ZYLOPRIM) 300 MG tablet Take 300 mg by mouth daily.    Marland Kitchen  amLODipine (NORVASC) 5 MG tablet Take 5 mg by mouth daily.    Marland Kitchen aspirin 81 MG tablet Take 81 mg by mouth daily.    . irbesartan (AVAPRO) 300 MG tablet Take 300 mg by mouth daily.    . mometasone (NASONEX) 50 MCG/ACT nasal spray Place 2 sprays into the nose daily. 17 g 11  . pravastatin (PRAVACHOL) 20 MG tablet Take 20 mg by mouth daily.    Marland Kitchen scopolamine (TRANSDERM-SCOP) 1 MG/3DAYS Place 1 patch onto the skin every 3 (three) days as needed (for travel).     . sildenafil (VIAGRA) 100 MG tablet Take 40 mg by mouth as needed for erectile dysfunction.     . Tamsulosin HCl (FLOMAX) 0.4 MG CAPS Take by mouth daily.     No facility-administered medications prior to visit.     PAST MEDICAL HISTORY: Past Medical History:  Diagnosis Date  . Erectile dysfunction   . Gout   . Hyperlipidemia   . Kidney stone   . Knee pain   . OSA on CPAP     PAST SURGICAL HISTORY: Past Surgical History:  Procedure Laterality Date  . CATARACT EXTRACTION, BILATERAL    . LIPOMA EXCISION    . MOHS SURGERY      FAMILY HISTORY: Family History  Problem Relation Age of Onset  . Hypertension Mother   .  Hypertension Father     SOCIAL HISTORY: Social History   Social History  . Marital status: Married    Spouse name: N/A  . Number of children: N/A  . Years of education: N/A   Occupational History  . retired    Social History Main Topics  . Smoking status: Former Smoker    Packs/day: 0.50    Years: 50.00    Types: Cigars, Cigarettes    Quit date: 04/13/2017  . Smokeless tobacco: Never Used  . Alcohol use 4.2 oz/week    7 Standard drinks or equivalent per week  . Drug use: No  . Sexual activity: Not on file   Other Topics Concern  . Not on file   Social History Narrative   Drinks 2 cups of coffee in the morning.      PHYSICAL EXAM  Vitals:   10/06/17 1326  BP: 134/76  Pulse: 81  Weight: 155 lb (70.3 kg)   Body mass index is 24.64 kg/m.  Generalized: Well developed, in no acute  distress   Neurological examination  Mentation: Alert oriented to time, place, history taking. Follows all commands speech and language fluent Cranial nerve II-XII: Pupils were equal round reactive to light. Extraocular movements were full, visual field were full on confrontational test. Facial sensation and strength were normal. Uvula tongue midline. Head turning and shoulder shrug  were normal and symmetric. Motor: The motor testing reveals 5 over 5 strength of all 4 extremities. Good symmetric motor tone is noted throughout.  Sensory: Sensory testing is intact to soft touch on all 4 extremities. No evidence of extinction is noted.  Coordination: Cerebellar testing reveals good finger-nose-finger and heel-to-shin bilaterally.  Gait and station: Gait is normal. Reflexes: Deep tendon reflexes are symmetric and normal bilaterally.   DIAGNOSTIC DATA (LABS, IMAGING, TESTING) - I reviewed patient records, labs, notes, testing and imaging myself where available.  Lab Results  Component Value Date   WBC 9.0 11/12/2015   HGB 15.1 11/12/2015   HCT 47.4 11/12/2015   MCV 90.1 11/12/2015   PLT 235.0 11/12/2015      Component Value Date/Time   NA 138 11/06/2010 1720   K 4.1 11/06/2010 1720   CL 105 11/06/2010 1720   GLUCOSE 108 (H) 11/06/2010 1720   BUN 14 11/06/2010 1720   CREATININE 1.6 (H) 11/06/2010 1720     ASSESSMENT AND PLAN 71 y.o. year old male  has a past medical history of Erectile dysfunction; Gout; Hyperlipidemia; Kidney stone; Knee pain; and OSA on CPAP. here with:  1. Obstructive sleep apnea on CPAP  Overall the patient is doing well. His download indicates that he has excellent compliance and good treatment of his apnea. He does wish for his settings to be changed to auto as he feels that that would be more comfortable. I will switch him to AutoSet 5-15 cm of water. Patient is amenable to that plan. He will follow-up in 1 year or sooner if needed.     Ward Givens,  MSN, NP-C 10/06/2017, 1:50 PM [facility] [address] [place], [place] [address] [phone]

## 2017-10-18 ENCOUNTER — Telehealth: Payer: Self-pay | Admitting: *Deleted

## 2017-10-18 ENCOUNTER — Telehealth: Payer: Self-pay | Admitting: Adult Health

## 2017-10-18 DIAGNOSIS — G4733 Obstructive sleep apnea (adult) (pediatric): Secondary | ICD-10-CM

## 2017-10-18 DIAGNOSIS — Z9989 Dependence on other enabling machines and devices: Principal | ICD-10-CM

## 2017-10-18 NOTE — Telephone Encounter (Signed)
Pt is wanting to go up to 10-15cm (is not getting enough pressure).  See his previous phone note.

## 2017-10-18 NOTE — Telephone Encounter (Signed)
Ok to change to 10-15 cm H20

## 2017-10-18 NOTE — Telephone Encounter (Signed)
Order made.  Levada Dy trotter, [facility] send message.

## 2017-10-18 NOTE — Telephone Encounter (Signed)
-----   Message from Patsy Baltimore sent at 10/18/2017  2:19 PM EDT ----- Regarding: RE: cpap auto pressure increase Lyn Hollingshead, I pulled the order from 10/11; our RT group spoke to pt on 10/15 and made the pressure change requested of 5-15 auto.   Pt said that he believes the 5 is too low and he wanted to speak to Dr. Brett Fairy about this low of a pressure; he said he feels it should be different.   Please advise. Thanks.   Angie  ----- Message ----- From: Brandon Melnick, RN Sent: 10/18/2017   1:23 PM To: Patsy Baltimore Subject: cpap auto pressure increase                    Hi angela,   This pt is calling about an increase of his cpap autoset.  See note and order for this increase 10-06-17 per Ward Givens, NP  Thanks  Liane Comber, RN

## 2017-10-18 NOTE — Telephone Encounter (Signed)
Patient is calling to get his autoset on CPAP  increased to 10 to 15. He is not getting enough pressure.

## 2017-10-18 NOTE — Telephone Encounter (Signed)
Message sent to angela trotter, [facility] relating to 10-06-17 order.

## 2017-10-20 ENCOUNTER — Telehealth: Payer: Self-pay | Admitting: *Deleted

## 2017-10-20 NOTE — Telephone Encounter (Signed)
-----   Message from Patsy Baltimore sent at 10/19/2017  8:17 AM EDT ----- Thank you Katharine Look I have pulled this order and we will be in touch with pt. Thanks.   Angie  ----- Message ----- From: Brandon Melnick, RN Sent: 10/18/2017   4:00 PM To: Patsy [place]  Per MM/NP ok to change pressure to 10-15cm.   Liane Comber, RN

## 2017-11-07 ENCOUNTER — Encounter: Payer: Self-pay | Admitting: Internal Medicine

## 2017-11-07 ENCOUNTER — Ambulatory Visit: Payer: Medicare Other | Admitting: Internal Medicine

## 2017-11-07 VITALS — BP 128/72 | HR 76 | Ht 67.0 in | Wt 156.0 lb

## 2017-11-07 DIAGNOSIS — R918 Other nonspecific abnormal finding of lung field: Secondary | ICD-10-CM | POA: Diagnosis not present

## 2017-11-07 DIAGNOSIS — Z23 Encounter for immunization: Secondary | ICD-10-CM

## 2017-11-07 NOTE — Patient Instructions (Addendum)
Bronchiectasis =   you have scarring of your bronchial tubes which means that they don't function perfectly normally and mucus tends to pool in certain areas of your lung which can cause pneumonia and further scarring of your lung and bronchial tubes   Whenever you develop cough congestion take mucinex or mucinex dm > these will help keep the mucus loose and flowing but if your condition worsens you need to seek help immediately preferably here or somewhere inside the [facility] system to compare xrays ( worse = darker or bloody mucus or pain on breathing in) .  We need to have a lower  threshold to give you  Antibiotics for flares of bronchitis .   I will let Dr Philip Aspen know you have some atherosclerosis of you coronaries for monitoring going forward

## 2017-11-07 NOTE — Progress Notes (Signed)
Subjective:     Patient ID: John Potts, male   DOB: [birth year],    MRN: [id number]    Brief patient profile:  71   yowm quit all smoking 04/13/17  with chronic nasal congestion with sensation of pnds x 2006 followed by Dr Janace Hoard Dohmeir on cpap since the same time but 08/30/15  ono on cpap desaturated  so referred to pulmonary clinic 10/07/2015 with nl pfts and after adjustment of CPAP by Dr Maureen Chatters  had no desats  referred to pulmonary clinic 05/13/2017 by Dr   Philip Aspen for abn ct abd showing GG changes on 05/04/17  But turned out to have mostly just bronchiectasis on HRCT as of 09/27/17 and no significant ILD      History of Present Illness  05/13/2017    ov/John Potts re: stopped cigar smoking one month prior to [place] Complaint  Patient presents with  . Pulmonary Consult    Seen here in the past for Nocturnal Hypoxenmia, last in Nov 2016. He is referred back today  by Dr. Leanna Battles for eval fo abnormal CT Abdomen. He states scan was done to eval hernia. He has had some cough recently b/c he has had a cold. His cough is occ prod with clear to white sputum.     his concern was R inguinal swelling > CT abd c/w hydrocele on R but GG changes both lower lobes (very mild) Only symptom he had was really bad chest and head cold for several weeks with lingering am cough with minimal discolored mucus but no sob/ mucus plugs / hemoptysis rec Augmentin 875 mg take one pill twice daily  X 10 days - take at breakfast and supper with large glass of water.  It would help reduce the usual side effects (diarrhea and yeast infections) if you ate cultured yogurt at lunch.  Keep appt for the scans and I will review them when available   For cough /congestion take mucinex dm up 1200 mg every 12h as needed     06/24/2017  f/u ov/John Potts re:  COPD GOLD 0/ whispy gg changes on CT abd  05/04/17 esp on R LL Chief Complaint  Patient presents with  . Follow-up    PFT's done today. Breathing is unchanged. He has occ PND  and cough.    Not limited by breathing from desired activities   Still intermittent nasal congestion/ drainage but s excess/ purulent sputum or mucus plugs   rec Please remember to go to the  x-ray department downstairs in the basement  for your tests - we will call you with the results when they are available. Be sure to keep up with Dr Janace Hoard if your sinuses continue to bother you  Your lung function is excellent and likely to stay that way if you maintain off cigarettes - good luck!!     11/07/2017  f/u ov/John Potts re: bronchiectasis  Chief Complaint  Patient presents with  . Follow-up    Breathing is doing well. He c/o some nasal congestion for the past wk.   ent f/u Janace Hoard Still smoking some cigars "when I do yard work"  Not limited by breathing from desired activities     No obvious day to day or daytime variability or assoc excess/ purulent sputum or mucus plugs or hemoptysis or cp or chest tightness, subjective wheeze or overt sinus or hb symptoms. No unusual exp hx or h/o childhood pna/ asthma or knowledge of premature birth.  Sleeping ok flat  without nocturnal  or early am exacerbation  of respiratory  c/o's or need for noct saba. Also denies any obvious fluctuation of symptoms with weather or environmental changes or other aggravating or alleviating factors except as outlined above   Current Allergies, Complete Past Medical History, Past Surgical History, Family History, and Social History were reviewed in [employer] record.  ROS  The following are not active complaints unless bolded Hoarseness, sore throat, dysphagia, dental problems, itching, sneezing,  nasal congestion or discharge of excess mucus or purulent secretions, ear ache,   fever, chills, sweats, unintended wt loss or wt gain, classically pleuritic or exertional cp,  orthopnea pnd or leg swelling, presyncope, palpitations, abdominal pain, anorexia, nausea, vomiting, diarrhea  or change in bowel  habits or change in bladder habits, change in stools or change in urine, dysuria, hematuria,  rash, arthralgias, visual complaints, headache, numbness, weakness or ataxia or problems with walking or coordination,  change in mood/affect or memory.        Current Meds  Medication Sig  . allopurinol (ZYLOPRIM) 300 MG tablet Take 300 mg by mouth daily.  Marland Kitchen amLODipine (NORVASC) 5 MG tablet Take 5 mg by mouth daily.  Marland Kitchen aspirin 81 MG tablet Take 81 mg by mouth daily.  . irbesartan (AVAPRO) 300 MG tablet Take 300 mg by mouth daily.  . mometasone (NASONEX) 50 MCG/ACT nasal spray Place 2 sprays into the nose daily.  . pravastatin (PRAVACHOL) 20 MG tablet Take 20 mg by mouth daily.  Marland Kitchen scopolamine (TRANSDERM-SCOP) 1 MG/3DAYS Place 1 patch onto the skin every 3 (three) days as needed (for travel).   . sildenafil (VIAGRA) 100 MG tablet Take 40 mg by mouth as needed for erectile dysfunction.   . Tamsulosin HCl (FLOMAX) 0.4 MG CAPS Take by mouth daily.                              Objective:  Physical Exam    amb wm nad     11/07/2017      156  06/24/2017        152  05/13/2017        147  11/12/2015      165     10/07/15 169 lb 12.8 oz (77.021 kg)  09/29/15 169 lb (76.658 kg)  07/21/15 174 lb (78.926 kg)    Vital signs reviewed  - Note on arrival 02 sats  97% on RA    HEENT: nl dentition, turbinates bilaterally, and oropharynx. Nl external ear canals without cough reflex   NECK :  without JVD/Nodes/TM/ nl carotid upstrokes bilaterally   LUNGS: no acc muscle use,  Nl contour chest which is clear to A and P bilaterally without cough on insp or exp maneuvers   CV:  RRR  no s3 or murmur or increase in P2, and no edema   ABD:  soft and nontender with nl inspiratory excursion in the supine position. No bruits or organomegaly appreciated, bowel sounds nl  MS:  Nl gait/ ext warm without deformities, calf tenderness, cyanosis  - ? Mild clubbing both hands  No obvious joint  restrictions   SKIN: warm and dry without lesions    NEURO:  alert, approp, nl sensorium with  no motor or cerebellar deficits apparent.       I personally reviewed images and agree with radiology impression as follows:   Chest CT  09/27/17  - HRCT chest  09/27/17 1. Mild cylindrical bronchiectasis in the bilateral lower lobes.  2. No evidence of interstitial lung disease. 3. Two-vessel coronary atherosclerosis            Assessment:

## 2017-11-08 ENCOUNTER — Encounter: Payer: Self-pay | Admitting: Internal Medicine

## 2017-11-08 NOTE — Assessment & Plan Note (Addendum)
See CT abd 05/04/17 whispy focal  GG changes R  Base  (full ct chest not done) - PFT's  06/24/2017  FEV1 3.99 (141 % ) ratio 84  p 4 % improvement from saba p nothing prior to study with DLCO  89 % corrects to 88  % for alv volume   - HRCT chest  09/27/17 1. Mild cylindrical bronchiectasis in the bilateral lower lobes.  2. No evidence of interstitial lung disease. 3. Two-vessel coronary atherosclerosis  I had an extended final summary discussion with the patient reviewing all relevant studies completed to date and  lasting 15 to 20 minutes of a 25 minute visit on the following issues:    1) reviewed HRCT and dx/ management of bronchiectasis  2) Emphasized role of airways mucociliary fnx clearing impurities in air esp his cigar smoke so strongly rec breathe just clean air going forward   3) low threshold for abx for purulent sputum plus mucinex   4) add flutter valve later if worsens  5) keep sinuses clear as possible with close ent f/u per Dr Janace Hoard    Pulmonary f/u can be prn

## 2018-01-13 ENCOUNTER — Ambulatory Visit: Payer: Self-pay | Admitting: Orthopedic Surgery

## 2018-02-02 ENCOUNTER — Ambulatory Visit: Payer: Self-pay | Admitting: Orthopedic Surgery

## 2018-02-02 NOTE — H&P (View-Only) (Signed)
John Potts, John Potts (72yo, M) DOB [birth year]   Chief Complaint Right hip pain   Vitals Ht: 5 ft 7 in 02/02/2018 11:00 am Wt: 150 lbs 02/02/2018 11:00 am BMI: 23.5 02/02/2018 11:00 am BP: 126/68 sitting L arm 02/02/2018 11:03 am Pulse: 76 bpm 02/02/2018 11:03 am   Allergies Reviewed Allergies WELLBUTRIN: - Made patient feel "worse"    Medications Reviewed Medications allopurinol 300 mg tablet TK 1 T PO QD 12/18/17   filled surescripts amLODIPine 5 mg tablet TK 1 T PO QD 11/14/17   filled surescripts aspirin 81 mg chewable tablet Chew 1 tablet(s) every day by oral route. 02/02/18   entered Quinita Kostelecky, PA-C irbesartan 300 mg tablet TK 1 T PO QD 12/18/17   filled surescripts multivitamin 02/02/18   entered Andreanna Mikolajczak, PA-C pravastatin 20 mg tablet TK 1 T PO QD 01/16/18   filled [employer] tamsulosin 0.4 mg capsule TK 1 C PO QD 01/17/18   filled [place] History Mother - Mother deceased   - Family history of cancer Unspecified Relation - Family history of cancer - Sibling   Social History Reviewed Social History Smoking Status: Former smoker Non-smoker Tobacco-years of use: 50 Alcohol intake: Moderate Hand Dominance: Right Work related injury?: N Advance directive: Y (Notes: Living Will) Medical Power of Attorney: Y    Surgical History Reviewed Surgical History Fatty Tumor Removal    Past Medical History Reviewed Past Medical History High Cholesterol: Y Hypertension: Y Joint Pain: Y Kidney Disease: Y - Multiple Kidney Stones Sleep apnea: Y Weight loss: Y Notes: Tinnitus,  Vertigo,  Cataracts (Surgical Removed),  Hemorrhoids,  Enlarged Prtostate,  Basal Cell Skin Cancer,  Gout   HPI Patient is a 72 year old male scheduled for a right total hip by Dr. Wynelle Link on 02/22/2018 at [facility]. The patient is a 72 year old male who is being followed for their right hip pain and osteoarthritis  (and subchondral cyst). They are month(s) out from IA injection. Symptoms reported include: pain in the right hip and right knee. He has also been DX with right knee OA. Patient states that the right knee hurts worse than the hip at times. He has pain after sitting (while driving) and pain with weightbearing. The patient feels that they are doing poorly and report their pain level to be moderate. The following medication has been used for pain control: antiinflammatory medication (Tylenol arthritis BID). : His RIGHT lower extremity pain is getting progressively worse. His hip pain is manifested in his RIGHT knee. He is losing more motion in the RIGHT hip and his function is getting worse. He is not having any left-sided pain. It is felt that his right knee pain is mostly referred pain from the hip above. Risks and benefits of a total hip replacement procedure have been discussed with the patient and he has elected to proceed with surgery on the right hip.    ROS Constitutional: Constitutional: fatigue, weight loss.  HEENT: Eyes: tinnitus.  Cardiovascular: Cardiovascular: no palpitations or chest pain.  Respiratory: Respiratory: no cough or shortness of breath and No COPD.  Gastrointestinal: Gastrointestinal: no vomiting or diarrhea and not vomiting blood.  Genitourinary: Genitourinary: weak stream.  Musculoskeletal: Musculoskeletal: Joint Pain.  Endocrine: temperature intolerance cold intolerance.  Hematologic/Lymphatic: Hematologic/Lymphatic no bruising or swollen glands.    Physical Exam Patient is a 72 year old male.  General Mental Status - Alert, cooperative and good historian. General Appearance - pleasant, Not in acute distress. Orientation -  Oriented X3. Build & Nutrition - Well nourished and Well developed.  Head and Neck Head - normocephalic, atraumatic . Neck Global Assessment - supple, no bruit auscultated on the right, no bruit auscultated on the left.  Eye Wears  readers Pupil - Bilateral - PERR Motion - Bilateral - EOMI.  Chest and Lung Exam Auscultation Breath sounds - clear at anterior chest wall and clear at posterior chest wall. Adventitious sounds - No Adventitious sounds.  Cardiovascular Auscultation Rhythm - Regular rate and rhythm. Heart Sounds - S1 WNL and S2 WNL. Murmurs & Other Heart Sounds - Auscultation of the heart reveals - No Murmurs.  Abdomen Palpation/Percussion Tenderness - Abdomen is non-tender to palpation. Abdomen is soft. Auscultation Auscultation of the abdomen reveals - Bowel sounds normal.  Male Genitourinary Note: Not done, not pertinent to present illness  Musculoskeletal Evaluation of the left hip shows flexion to 120 rotation in 30 out 40 and abduction 40 without discomfort. There is no tenderness over the greater trochanter. There is no pain on provocative testing of the hip.Marland Kitchen His RIGHT hip can be flexed to 100 with no internal rotation, 10 of external rotation and 20 of abduction. His RIGHT knee shows no effusion. Range is 5-125. There is no instability. His gait pattern is antalgic on the RIGHT.  Radiographs AP pelvis and lateral of the RIGHT hip show bone-on-bone arthritis with a large subchondral cyst in the femoral head and osteophyte formation.  Assessment / Plan 1. Osteoarthritis of right hip joint M16.11: Unilateral primary osteoarthritis, right hip  Patient Instructions Surgical Plans: Right Total Hip Replacement - Anterior Approach Disposition: Home with family, HHPT PCP: Dr. Leanna Battles - patient will contact Dr. Philip Aspen for clearance for the upcoming procedure. IV TXA Anesthesia Issues: No issues with anesthetics but difficult voiding post surgery in the past.  Patient was instructed on what medications to stop prior to surgery.  - Follow up visit in 2 weeks with Dr. Wynelle Link - Begin physical therapy following surgery - Pre-operative lab work as pre Pre-Surgical Testing -  Prescriptions will be provided in hospital at time of discharge  Return to [place], PA-C for [place] at [facility] on 03/08/2018 at 11:00 AM  Encounter signed-off by Mickel Crow, PA-C

## 2018-02-02 NOTE — H&P (Signed)
John Potts, John Potts (72yo, M) DOB Dec 17, 1946   Chief Complaint Right hip pain   Vitals Ht: 5 ft 7 in 02/02/2018 11:00 am Wt: 150 lbs 02/02/2018 11:00 am BMI: 23.5 02/02/2018 11:00 am BP: 126/68 sitting L arm 02/02/2018 11:03 am Pulse: 76 bpm 02/02/2018 11:03 am   Allergies Reviewed Allergies WELLBUTRIN: - Made patient feel "worse"    Medications Reviewed Medications allopurinol 300 mg tablet TK 1 T PO QD 12/18/17   filled surescripts amLODIPine 5 mg tablet TK 1 T PO QD 11/14/17   filled surescripts aspirin 81 mg chewable tablet Chew 1 tablet(s) every day by oral route. 02/02/18   entered Karol Liendo, PA-C irbesartan 300 mg tablet TK 1 T PO QD 12/18/17   filled surescripts multivitamin 02/02/18   entered Aarushi Hemric, PA-C pravastatin 20 mg tablet TK 1 T PO QD 01/16/18   filled [employer] tamsulosin 0.4 mg capsule TK 1 C PO QD 01/17/18   filled [place] History Mother - Mother deceased   - Family history of cancer Unspecified Relation - Family history of cancer - Sibling   Social History Reviewed Social History Smoking Status: Former smoker Non-smoker Tobacco-years of use: 50 Alcohol intake: Moderate Hand Dominance: Right Work related injury?: N Advance directive: Y (Notes: Living Will) Medical Power of Attorney: Y    Surgical History Reviewed Surgical History Fatty Tumor Removal    Past Medical History Reviewed Past Medical History High Cholesterol: Y Hypertension: Y Joint Pain: Y Kidney Disease: Y - Multiple Kidney Stones Sleep apnea: Y Weight loss: Y Notes: Tinnitus,  Vertigo,  Cataracts (Surgical Removed),  Hemorrhoids,  Enlarged Prtostate,  Basal Cell Skin Cancer,  Gout   HPI Patient is a 72 year old male scheduled for a right total hip by Dr. Wynelle Link on 02/22/2018 at [facility]. The patient is a 72 year old male who is being followed for their right hip pain and osteoarthritis  (and subchondral cyst). They are month(s) out from IA injection. Symptoms reported include: pain in the right hip and right knee. He has also been DX with right knee OA. Patient states that the right knee hurts worse than the hip at times. He has pain after sitting (while driving) and pain with weightbearing. The patient feels that they are doing poorly and report their pain level to be moderate. The following medication has been used for pain control: antiinflammatory medication (Tylenol arthritis BID). : His RIGHT lower extremity pain is getting progressively worse. His hip pain is manifested in his RIGHT knee. He is losing more motion in the RIGHT hip and his function is getting worse. He is not having any left-sided pain. It is felt that his right knee pain is mostly referred pain from the hip above. Risks and benefits of a total hip replacement procedure have been discussed with the patient and he has elected to proceed with surgery on the right hip.    ROS Constitutional: Constitutional: fatigue, weight loss.  HEENT: Eyes: tinnitus.  Cardiovascular: Cardiovascular: no palpitations or chest pain.  Respiratory: Respiratory: no cough or shortness of breath and No COPD.  Gastrointestinal: Gastrointestinal: no vomiting or diarrhea and not vomiting blood.  Genitourinary: Genitourinary: weak stream.  Musculoskeletal: Musculoskeletal: Joint Pain.  Endocrine: temperature intolerance cold intolerance.  Hematologic/Lymphatic: Hematologic/Lymphatic no bruising or swollen glands.    Physical Exam Patient is a 72 year old male.  General Mental Status - Alert, cooperative and good historian. General Appearance - pleasant, Not in acute distress. Orientation -  Oriented X3. Build & Nutrition - Well nourished and Well developed.  Head and Neck Head - normocephalic, atraumatic . Neck Global Assessment - supple, no bruit auscultated on the right, no bruit auscultated on the left.  Eye Wears  readers Pupil - Bilateral - PERR Motion - Bilateral - EOMI.  Chest and Lung Exam Auscultation Breath sounds - clear at anterior chest wall and clear at posterior chest wall. Adventitious sounds - No Adventitious sounds.  Cardiovascular Auscultation Rhythm - Regular rate and rhythm. Heart Sounds - S1 WNL and S2 WNL. Murmurs & Other Heart Sounds - Auscultation of the heart reveals - No Murmurs.  Abdomen Palpation/Percussion Tenderness - Abdomen is non-tender to palpation. Abdomen is soft. Auscultation Auscultation of the abdomen reveals - Bowel sounds normal.  Male Genitourinary Note: Not done, not pertinent to present illness  Musculoskeletal Evaluation of the left hip shows flexion to 120 rotation in 30 out 40 and abduction 40 without discomfort. There is no tenderness over the greater trochanter. There is no pain on provocative testing of the hip.Marland Kitchen His RIGHT hip can be flexed to 100 with no internal rotation, 10 of external rotation and 20 of abduction. His RIGHT knee shows no effusion. Range is 5-125. There is no instability. His gait pattern is antalgic on the RIGHT.  Radiographs AP pelvis and lateral of the RIGHT hip show bone-on-bone arthritis with a large subchondral cyst in the femoral head and osteophyte formation.  Assessment / Plan 1. Osteoarthritis of right hip joint M16.11: Unilateral primary osteoarthritis, right hip  Patient Instructions Surgical Plans: Right Total Hip Replacement - Anterior Approach Disposition: Home with family, HHPT PCP: Dr. Leanna Battles - patient will contact Dr. Philip Aspen for clearance for the upcoming procedure. IV TXA Anesthesia Issues: No issues with anesthetics but difficult voiding post surgery in the past.  Patient was instructed on what medications to stop prior to surgery.  - Follow up visit in 2 weeks with Dr. Wynelle Link - Begin physical therapy following surgery - Pre-operative lab work as pre Pre-Surgical Testing -  Prescriptions will be provided in hospital at time of discharge  Return to [place], PA-C for [place] at [facility] on 03/08/2018 at 11:00 AM  Encounter signed-off by Mickel Crow, PA-C

## 2018-02-13 NOTE — Patient Instructions (Signed)
John Potts  02/13/2018   Your procedure is scheduled on: 02-22-18   Report to [facility] Main  Entrance Follow signs to Short Stay on first floor at 530 AM    Call this number if you have problems the morning of surgery [phone]   Remember: Do not eat food or drink liquids :After Midnight.     Take these medicines the morning of surgery with A SIP OF WATER: Amlodipine (Norvasc), and Pravastatin (Pravachol). You may also bring and use your nasal spray.                                You may not have any metal on your body including hair pins and              piercings  Do not wear jewelry,  lotions, powders or deodorant             Men may shave face and neck.   Do not bring valuables to the hospital. [place].  Contacts, dentures or bridgework may not be worn into surgery.  Leave suitcase in the car. After surgery it may be brought to your room.                 Please read over the following fact sheets you were given: _____________________________________________________________________         [facility] - Preparing for Surgery Before surgery, you can play an important role.  Because skin is not sterile, your skin needs to be as free of germs as possible.  You can reduce the number of germs on your skin by washing with CHG (chlorahexidine gluconate) soap before surgery.  CHG is an antiseptic cleaner which kills germs and bonds with the skin to continue killing germs even after washing. Please DO NOT use if you have an allergy to CHG or antibacterial soaps.  If your skin becomes reddened/irritated stop using the CHG and inform your nurse when you arrive at Short Stay. Do not shave (including legs and underarms) for at least 48 hours prior to the first CHG shower.  You may shave your face/neck. Please follow these instructions carefully:  1.  Shower with CHG Soap the night before surgery and the   morning of Surgery.  2.  If you choose to wash your hair, wash your hair first as usual with your  normal  shampoo.  3.  After you shampoo, rinse your hair and body thoroughly to remove the  shampoo.                           4.  Use CHG as you would any other liquid soap.  You can apply chg directly  to the skin and wash                       Gently with a scrungie or clean washcloth.  5.  Apply the CHG Soap to your body ONLY FROM THE NECK DOWN.   Do not use on face/ open  Wound or open sores. Avoid contact with eyes, ears mouth and genitals (private parts).                       Wash face,  Genitals (private parts) with your normal soap.             6.  Wash thoroughly, paying special attention to the area where your surgery  will be performed.  7.  Thoroughly rinse your body with warm water from the neck down.  8.  DO NOT shower/wash with your normal soap after using and rinsing off  the CHG Soap.                9.  Pat yourself dry with a clean towel.            10.  Wear clean pajamas.            11.  Place clean sheets on your bed the night of your first shower and do not  sleep with pets. Day of Surgery : Do not apply any lotions/deodorants the morning of surgery.  Please wear clean clothes to the hospital/surgery center.  FAILURE TO FOLLOW THESE INSTRUCTIONS MAY RESULT IN THE CANCELLATION OF YOUR SURGERY PATIENT SIGNATURE_________________________________  NURSE SIGNATURE__________________________________  ________________________________________________________________________   Adam Phenix  An incentive spirometer is a tool that can help keep your lungs clear and active. This tool measures how well you are filling your lungs with each breath. Taking long deep breaths may help reverse or decrease the chance of developing breathing (pulmonary) problems (especially infection) following:  A long period of time when you are unable to move or be  active. BEFORE THE PROCEDURE   If the spirometer includes an indicator to show your best effort, your nurse or respiratory therapist will set it to a desired goal.  If possible, sit up straight or lean slightly forward. Try not to slouch.  Hold the incentive spirometer in an upright position. INSTRUCTIONS FOR USE  1. Sit on the edge of your bed if possible, or sit up as far as you can in bed or on a chair. 2. Hold the incentive spirometer in an upright position. 3. Breathe out normally. 4. Place the mouthpiece in your mouth and seal your lips tightly around it. 5. Breathe in slowly and as deeply as possible, raising the piston or the ball toward the top of the column. 6. Hold your breath for 3-5 seconds or for as long as possible. Allow the piston or ball to fall to the bottom of the column. 7. Remove the mouthpiece from your mouth and breathe out normally. 8. Rest for a few seconds and repeat Steps 1 through 7 at least 10 times every 1-2 hours when you are awake. Take your time and take a few normal breaths between deep breaths. 9. The spirometer may include an indicator to show your best effort. Use the indicator as a goal to work toward during each repetition. 10. After each set of 10 deep breaths, practice coughing to be sure your lungs are clear. If you have an incision (the cut made at the time of surgery), support your incision when coughing by placing a pillow or rolled up towels firmly against it. Once you are able to get out of bed, walk around indoors and cough well. You may stop using the incentive spirometer when instructed by your caregiver.  RISKS AND COMPLICATIONS  Take your time so you do not get  dizzy or light-headed.  If you are in pain, you may need to take or ask for pain medication before doing incentive spirometry. It is harder to take a deep breath if you are having pain. AFTER USE  Rest and breathe slowly and easily.  It can be helpful to keep track of a log of  your progress. Your caregiver can provide you with a simple table to help with this. If you are using the spirometer at home, follow these instructions: [place] IF:   You are having difficultly using the spirometer.  You have trouble using the spirometer as often as instructed.  Your pain medication is not giving enough relief while using the spirometer.  You develop fever of 100.5 F (38.1 C) or higher. SEEK IMMEDIATE MEDICAL CARE IF:   You cough up bloody sputum that had not been present before.  You develop fever of 102 F (38.9 C) or greater.  You develop worsening pain at or near the incision site. MAKE SURE YOU:   Understand these instructions.  Will watch your condition.  Will get help right away if you are not doing well or get worse. Document Released: 04/25/2007 Document Revised: 03/06/2012 Document Reviewed: 06/26/2007 ExitCare Patient Information 2014 ExitCare, [place].   ________________________________________________________________________  WHAT IS A BLOOD TRANSFUSION? Blood Transfusion Information  A transfusion is the replacement of blood or some of its parts. Blood is made up of multiple cells which provide different functions.  Red blood cells carry oxygen and are used for blood loss replacement.  White blood cells fight against infection.  Platelets control bleeding.  Plasma helps clot blood.  Other blood products are available for specialized needs, such as hemophilia or other clotting disorders. BEFORE THE TRANSFUSION  Who gives blood for transfusions?   Healthy volunteers who are fully evaluated to make sure their blood is safe. This is blood bank blood. Transfusion therapy is the safest it has ever been in the practice of medicine. Before blood is taken from a donor, a complete history is taken to make sure that person has no history of diseases nor engages in risky social behavior (examples are intravenous drug use or sexual activity  with multiple partners). The donor's travel history is screened to minimize risk of transmitting infections, such as malaria. The donated blood is tested for signs of infectious diseases, such as HIV and hepatitis. The blood is then tested to be sure it is compatible with you in order to minimize the chance of a transfusion reaction. If you or a relative donates blood, this is often done in anticipation of surgery and is not appropriate for emergency situations. It takes many days to process the donated blood. RISKS AND COMPLICATIONS Although transfusion therapy is very safe and saves many lives, the main dangers of transfusion include:   Getting an infectious disease.  Developing a transfusion reaction. This is an allergic reaction to something in the blood you were given. Every precaution is taken to prevent this. The decision to have a blood transfusion has been considered carefully by your caregiver before blood is given. Blood is not given unless the benefits outweigh the risks. AFTER THE TRANSFUSION  Right after receiving a blood transfusion, you will usually feel much better and more energetic. This is especially true if your red blood cells have gotten low (anemic). The transfusion raises the level of the red blood cells which carry oxygen, and this usually causes an energy increase.  The nurse administering the transfusion will  monitor you carefully for complications. HOME CARE INSTRUCTIONS  No special instructions are needed after a transfusion. You may find your energy is better. Speak with your caregiver about any limitations on activity for underlying diseases you may have. SEEK MEDICAL CARE IF:   Your condition is not improving after your transfusion.  You develop redness or irritation at the intravenous (IV) site. SEEK IMMEDIATE MEDICAL CARE IF:  Any of the following symptoms occur over the next 12 hours:  Shaking chills.  You have a temperature by mouth above 102 F (38.9  C), not controlled by medicine.  Chest, back, or muscle pain.  People around you feel you are not acting correctly or are confused.  Shortness of breath or difficulty breathing.  Dizziness and fainting.  You get a rash or develop hives.  You have a decrease in urine output.  Your urine turns a dark color or changes to pink, red, or brown. Any of the following symptoms occur over the next 10 days:  You have a temperature by mouth above 102 F (38.9 C), not controlled by medicine.  Shortness of breath.  Weakness after normal activity.  The white part of the eye turns yellow (jaundice).  You have a decrease in the amount of urine or are urinating less often.  Your urine turns a dark color or changes to pink, red, or brown. Document Released: 12/10/2000 Document Revised: 03/06/2012 Document Reviewed: 07/29/2008 [employer] Patient Information 2014 [place], [place].  _______________________________________________________________________

## 2018-02-15 ENCOUNTER — Inpatient Hospital Stay (HOSPITAL_COMMUNITY)
Admission: RE | Admit: 2018-02-15 | Discharge: 2018-02-15 | Disposition: A | Discharge status: Home / Self Care | Payer: Medicare Other | Source: Ambulatory Visit

## 2018-02-15 NOTE — Progress Notes (Signed)
04-15-17 Surgical clearance on chart from Dr. Philip Aspen

## 2018-02-17 ENCOUNTER — Other Ambulatory Visit: Payer: Self-pay

## 2018-02-17 ENCOUNTER — Encounter (HOSPITAL_COMMUNITY)
Admission: RE | Admit: 2018-02-17 | Discharge: 2018-02-17 | Disposition: A | Discharge status: Home / Self Care | Payer: Medicare Other | Source: Ambulatory Visit | Attending: Orthopedic Surgery | Admitting: Orthopedic Surgery

## 2018-02-17 ENCOUNTER — Encounter (HOSPITAL_COMMUNITY): Payer: Self-pay

## 2018-02-17 ENCOUNTER — Encounter (INDEPENDENT_AMBULATORY_CARE_PROVIDER_SITE_OTHER): Payer: Self-pay

## 2018-02-17 DIAGNOSIS — R5382 Chronic fatigue, unspecified: Secondary | ICD-10-CM | POA: Insufficient documentation

## 2018-02-17 DIAGNOSIS — G4733 Obstructive sleep apnea (adult) (pediatric): Secondary | ICD-10-CM | POA: Insufficient documentation

## 2018-02-17 DIAGNOSIS — Z9989 Dependence on other enabling machines and devices: Secondary | ICD-10-CM | POA: Insufficient documentation

## 2018-02-17 DIAGNOSIS — I10 Essential (primary) hypertension: Secondary | ICD-10-CM | POA: Insufficient documentation

## 2018-02-17 DIAGNOSIS — Z01812 Encounter for preprocedural laboratory examination: Secondary | ICD-10-CM | POA: Insufficient documentation

## 2018-02-17 DIAGNOSIS — L723 Sebaceous cyst: Secondary | ICD-10-CM | POA: Insufficient documentation

## 2018-02-17 DIAGNOSIS — Z01810 Encounter for preprocedural cardiovascular examination: Secondary | ICD-10-CM | POA: Insufficient documentation

## 2018-02-17 DIAGNOSIS — J441 Chronic obstructive pulmonary disease with (acute) exacerbation: Secondary | ICD-10-CM | POA: Insufficient documentation

## 2018-02-17 HISTORY — DX: Other complications of anesthesia, initial encounter: T88.59XA

## 2018-02-17 HISTORY — DX: Adverse effect of unspecified anesthetic, initial encounter: T41.45XA

## 2018-02-17 LAB — CBC
HCT: 40.8 % (ref 39.0–52.0)
HEMOGLOBIN: 13.3 g/dL (ref 13.0–17.0)
MCH: 29.6 pg (ref 26.0–34.0)
MCHC: 32.6 g/dL (ref 30.0–36.0)
MCV: 90.7 fL (ref 78.0–100.0)
Platelets: 236 10*3/uL (ref 150–400)
RBC: 4.5 MIL/uL (ref 4.22–5.81)
RDW: 13.5 % (ref 11.5–15.5)
WBC: 7.6 10*3/uL (ref 4.0–10.5)

## 2018-02-17 LAB — COMPREHENSIVE METABOLIC PANEL
ALK PHOS: 74 U/L (ref 38–126)
ALT: 25 U/L (ref 17–63)
ANION GAP: 10 (ref 5–15)
AST: 23 U/L (ref 15–41)
Albumin: 4 g/dL (ref 3.5–5.0)
BUN: 17 mg/dL (ref 6–20)
CALCIUM: 9.3 mg/dL (ref 8.9–10.3)
CO2: 26 mmol/L (ref 22–32)
Chloride: 106 mmol/L (ref 101–111)
Creatinine, Ser: 0.91 mg/dL (ref 0.61–1.24)
Glucose, Bld: 103 mg/dL — ABNORMAL HIGH (ref 65–99)
Potassium: 4.7 mmol/L (ref 3.5–5.1)
SODIUM: 142 mmol/L (ref 135–145)
Total Bilirubin: 1 mg/dL (ref 0.3–1.2)
Total Protein: 6.8 g/dL (ref 6.5–8.1)

## 2018-02-17 LAB — PROTIME-INR
INR: 0.94
PROTHROMBIN TIME: 12.5 s (ref 11.4–15.2)

## 2018-02-17 LAB — ABO/RH: ABO/RH(D): A POS

## 2018-02-17 LAB — SURGICAL PCR SCREEN
MRSA, PCR: NEGATIVE
Staphylococcus aureus: NEGATIVE

## 2018-02-17 LAB — APTT: aPTT: 31 seconds (ref 24–36)

## 2018-02-17 NOTE — Patient Instructions (Signed)
John Potts  02/17/2018   Your procedure is scheduled on: 02-22-18   Report to [facility] Main  Entrance   Have a seat in the [place]. Please note there is a phone at the [employer]. Please call [phone] on that phone. Someone  from Short Stay will come and get you from the Main Lobby and take you to Short Stay.    Call this number if you have problems the morning of surgery [phone]   Remember: Do not eat food or drink liquids :After Midnight.     Take these medicines the morning of surgery with A SIP OF WATER: Amlodipine (Norvas), Pravastatin (Pravachol), and Allopurinol (Zyloprion)                               You may not have any metal on your body including hair pins and              piercings  Do not wear jewelry,  lotions, powders or  deodorant             Men may shave face and neck.   Do not bring valuables to the hospital. [place].  Contacts, dentures or bridgework may not be worn into surgery.  Leave suitcase in the car. After surgery it may be brought to your room.     Special Instructions: Please bring your mask and tubing for your CPAP machine. Also bring a copy of your Healthcare Power of Attorney, and  Living Will             Please read over the following fact sheets you were given: _____________________________________________________________________          [facility] [place] - Preparing for Surgery Before surgery, you can play an important role.  Because skin is not sterile, your skin needs to be as free of germs as possible.  You can reduce the number of germs on your skin by washing with CHG (chlorahexidine gluconate) soap before surgery.  CHG is an antiseptic cleaner which kills germs and bonds with the skin to continue killing germs even after washing. Please DO NOT use if you have an allergy to CHG or antibacterial soaps.  If your skin becomes reddened/irritated  stop using the CHG and inform your nurse when you arrive at Short Stay. Do not shave (including legs and underarms) for at least 48 hours prior to the first CHG shower.  You may shave your face/neck. Please follow these instructions carefully:  1.  Shower with CHG Soap the night before surgery and the  morning of Surgery.  2.  If you choose to wash your hair, wash your hair first as usual with your  normal  shampoo.  3.  After you shampoo, rinse your hair and body thoroughly to remove the  shampoo.                           4.  Use CHG as you would any other liquid soap.  You can apply chg directly  to the skin and wash  Gently with a scrungie or clean washcloth.  5.  Apply the CHG Soap to your body ONLY FROM THE NECK DOWN.   Do not use on face/ open                           Wound or open sores. Avoid contact with eyes, ears mouth and genitals (private parts).                       Wash face,  Genitals (private parts) with your normal soap.             6.  Wash thoroughly, paying special attention to the area where your surgery  will be performed.  7.  Thoroughly rinse your body with warm water from the neck down.  8.  DO NOT shower/wash with your normal soap after using and rinsing off  the CHG Soap.                9.  Pat yourself dry with a clean towel.            10.  Wear clean pajamas.            11.  Place clean sheets on your bed the night of your first shower and do not  sleep with pets. Day of Surgery : Do not apply any lotions/deodorants the morning of surgery.  Please wear clean clothes to the hospital/surgery center.  FAILURE TO FOLLOW THESE INSTRUCTIONS MAY RESULT IN THE CANCELLATION OF YOUR SURGERY PATIENT SIGNATURE_________________________________  NURSE SIGNATURE__________________________________  ________________________________________________________________________   Adam Phenix  An incentive spirometer is a tool that can help keep your  lungs clear and active. This tool measures how well you are filling your lungs with each breath. Taking long deep breaths may help reverse or decrease the chance of developing breathing (pulmonary) problems (especially infection) following:  A long period of time when you are unable to move or be active. BEFORE THE PROCEDURE   If the spirometer includes an indicator to show your best effort, your nurse or respiratory therapist will set it to a desired goal.  If possible, sit up straight or lean slightly forward. Try not to slouch.  Hold the incentive spirometer in an upright position. INSTRUCTIONS FOR USE  1. Sit on the edge of your bed if possible, or sit up as far as you can in bed or on a chair. 2. Hold the incentive spirometer in an upright position. 3. Breathe out normally. 4. Place the mouthpiece in your mouth and seal your lips tightly around it. 5. Breathe in slowly and as deeply as possible, raising the piston or the ball toward the top of the column. 6. Hold your breath for 3-5 seconds or for as long as possible. Allow the piston or ball to fall to the bottom of the column. 7. Remove the mouthpiece from your mouth and breathe out normally. 8. Rest for a few seconds and repeat Steps 1 through 7 at least 10 times every 1-2 hours when you are awake. Take your time and take a few normal breaths between deep breaths. 9. The spirometer may include an indicator to show your best effort. Use the indicator as a goal to work toward during each repetition. 10. After each set of 10 deep breaths, practice coughing to be sure your lungs are clear. If you have an incision (the cut made at the time of surgery),  support your incision when coughing by placing a pillow or rolled up towels firmly against it. Once you are able to get out of bed, walk around indoors and cough well. You may stop using the incentive spirometer when instructed by your caregiver.  RISKS AND COMPLICATIONS  Take your time so  you do not get dizzy or light-headed.  If you are in pain, you may need to take or ask for pain medication before doing incentive spirometry. It is harder to take a deep breath if you are having pain. AFTER USE  Rest and breathe slowly and easily.  It can be helpful to keep track of a log of your progress. Your caregiver can provide you with a simple table to help with this. If you are using the spirometer at home, follow these instructions: [place] IF:   You are having difficultly using the spirometer.  You have trouble using the spirometer as often as instructed.  Your pain medication is not giving enough relief while using the spirometer.  You develop fever of 100.5 F (38.1 C) or higher. SEEK IMMEDIATE MEDICAL CARE IF:   You cough up bloody sputum that had not been present before.  You develop fever of 102 F (38.9 C) or greater.  You develop worsening pain at or near the incision site. MAKE SURE YOU:   Understand these instructions.  Will watch your condition.  Will get help right away if you are not doing well or get worse. Document Released: 04/25/2007 Document Revised: 03/06/2012 Document Reviewed: 06/26/2007 ExitCare Patient Information 2014 Exit[employer], [place].   ________________________________________________________________________  WHAT IS A BLOOD TRANSFUSION? Blood Transfusion Information  A transfusion is the replacement of blood or some of its parts. Blood is made up of multiple cells which provide different functions.  Red blood cells carry oxygen and are used for blood loss replacement.  White blood cells fight against infection.  Platelets control bleeding.  Plasma helps clot blood.  Other blood products are available for specialized needs, such as hemophilia or other clotting disorders. BEFORE THE TRANSFUSION  Who gives blood for transfusions?   Healthy volunteers who are fully evaluated to make sure their blood is safe. This is blood  bank blood. Transfusion therapy is the safest it has ever been in the practice of medicine. Before blood is taken from a donor, a complete history is taken to make sure that person has no history of diseases nor engages in risky social behavior (examples are intravenous drug use or sexual activity with multiple partners). The donor's travel history is screened to minimize risk of transmitting infections, such as malaria. The donated blood is tested for signs of infectious diseases, such as HIV and hepatitis. The blood is then tested to be sure it is compatible with you in order to minimize the chance of a transfusion reaction. If you or a relative donates blood, this is often done in anticipation of surgery and is not appropriate for emergency situations. It takes many days to process the donated blood. RISKS AND COMPLICATIONS Although transfusion therapy is very safe and saves many lives, the main dangers of transfusion include:   Getting an infectious disease.  Developing a transfusion reaction. This is an allergic reaction to something in the blood you were given. Every precaution is taken to prevent this. The decision to have a blood transfusion has been considered carefully by your caregiver before blood is given. Blood is not given unless the benefits outweigh the risks. AFTER THE TRANSFUSION  Right after receiving a blood transfusion, you will usually feel much better and more energetic. This is especially true if your red blood cells have gotten low (anemic). The transfusion raises the level of the red blood cells which carry oxygen, and this usually causes an energy increase.  The nurse administering the transfusion will monitor you carefully for complications. HOME CARE INSTRUCTIONS  No special instructions are needed after a transfusion. You may find your energy is better. Speak with your caregiver about any limitations on activity for underlying diseases you may have. SEEK MEDICAL CARE  IF:   Your condition is not improving after your transfusion.  You develop redness or irritation at the intravenous (IV) site. SEEK IMMEDIATE MEDICAL CARE IF:  Any of the following symptoms occur over the next 12 hours:  Shaking chills.  You have a temperature by mouth above 102 F (38.9 C), not controlled by medicine.  Chest, back, or muscle pain.  People around you feel you are not acting correctly or are confused.  Shortness of breath or difficulty breathing.  Dizziness and fainting.  You get a rash or develop hives.  You have a decrease in urine output.  Your urine turns a dark color or changes to pink, red, or brown. Any of the following symptoms occur over the next 10 days:  You have a temperature by mouth above 102 F (38.9 C), not controlled by medicine.  Shortness of breath.  Weakness after normal activity.  The white part of the eye turns yellow (jaundice).  You have a decrease in the amount of urine or are urinating less often.  Your urine turns a dark color or changes to pink, red, or brown. Document Released: 12/10/2000 Document Revised: 03/06/2012 Document Reviewed: 07/29/2008 [facility] Patient Information 2014 [place], [place].  _______________________________________________________________________

## 2018-02-21 MED ORDER — TRANEXAMIC ACID 1000 MG/10ML IV SOLN
1000.0000 mg | INTRAVENOUS | Status: AC
  Administered 2018-02-22: 1000 mg via INTRAVENOUS
  Filled 2018-02-21: qty 1100

## 2018-02-21 NOTE — Anesthesia Preprocedure Evaluation (Addendum)
Anesthesia Evaluation  Patient identified by MRN, date of birth, ID band Patient awake    Reviewed: Allergy & Precautions, NPO status , Patient's Chart, lab work & pertinent test results  Airway Mallampati: I       Dental no notable dental hx. (+) Teeth Intact   Pulmonary former smoker,    Pulmonary exam normal breath sounds clear to auscultation       Cardiovascular hypertension, Pt. on medications Normal cardiovascular exam Rhythm:Regular Rate:Normal     Neuro/Psych    GI/Hepatic negative GI ROS, Neg liver ROS,   Endo/Other    Renal/GU   negative genitourinary   Musculoskeletal   Abdominal Normal abdominal exam  (+)   Peds  Hematology negative hematology ROS (+)   Anesthesia Other Findings   Reproductive/Obstetrics                            Anesthesia Physical Anesthesia Plan  ASA: II  Anesthesia Plan: Spinal   Post-op Pain Management:    Induction:   PONV Risk Score and Plan: 2 and Ondansetron and Dexamethasone  Airway Management Planned: Natural Airway, Nasal Cannula and Simple Face Mask  Additional Equipment:   Intra-op Plan:   Post-operative Plan:   Informed Consent: I have reviewed the patients History and Physical, chart, labs and discussed the procedure including the risks, benefits and alternatives for the proposed anesthesia with the patient or authorized representative who has indicated his/her understanding and acceptance.     Plan Discussed with: CRNA and Surgeon  Anesthesia Plan Comments:         Anesthesia Quick Evaluation

## 2018-02-22 ENCOUNTER — Inpatient Hospital Stay (HOSPITAL_COMMUNITY): Payer: Medicare Other

## 2018-02-22 ENCOUNTER — Inpatient Hospital Stay (HOSPITAL_COMMUNITY): Payer: Medicare Other | Admitting: Anesthesiology

## 2018-02-22 ENCOUNTER — Inpatient Hospital Stay (HOSPITAL_COMMUNITY)
Admission: RE | Admit: 2018-02-22 | Discharge: 2018-02-23 | DRG: 470 | Disposition: A | Discharge status: Home Health | Payer: Medicare Other | Source: Ambulatory Visit | Attending: Orthopedic Surgery | Admitting: Orthopedic Surgery

## 2018-02-22 ENCOUNTER — Encounter (HOSPITAL_COMMUNITY)
Admission: RE | Disposition: A | Discharge status: Home Health | Payer: Self-pay | Source: Ambulatory Visit | Attending: Orthopedic Surgery

## 2018-02-22 ENCOUNTER — Encounter (HOSPITAL_COMMUNITY): Payer: Self-pay | Admitting: Emergency Medicine

## 2018-02-22 ENCOUNTER — Other Ambulatory Visit: Payer: Self-pay

## 2018-02-22 DIAGNOSIS — Z809 Family history of malignant neoplasm, unspecified: Secondary | ICD-10-CM | POA: Diagnosis not present

## 2018-02-22 DIAGNOSIS — E785 Hyperlipidemia, unspecified: Secondary | ICD-10-CM | POA: Diagnosis present

## 2018-02-22 DIAGNOSIS — E7800 Pure hypercholesterolemia, unspecified: Secondary | ICD-10-CM | POA: Diagnosis present

## 2018-02-22 DIAGNOSIS — I10 Essential (primary) hypertension: Secondary | ICD-10-CM | POA: Diagnosis present

## 2018-02-22 DIAGNOSIS — Z85828 Personal history of other malignant neoplasm of skin: Secondary | ICD-10-CM | POA: Diagnosis not present

## 2018-02-22 DIAGNOSIS — M25551 Pain in right hip: Secondary | ICD-10-CM | POA: Diagnosis present

## 2018-02-22 DIAGNOSIS — Z87442 Personal history of urinary calculi: Secondary | ICD-10-CM | POA: Diagnosis not present

## 2018-02-22 DIAGNOSIS — M109 Gout, unspecified: Secondary | ICD-10-CM | POA: Diagnosis present

## 2018-02-22 DIAGNOSIS — M1611 Unilateral primary osteoarthritis, right hip: Principal | ICD-10-CM | POA: Diagnosis present

## 2018-02-22 DIAGNOSIS — G4733 Obstructive sleep apnea (adult) (pediatric): Secondary | ICD-10-CM | POA: Diagnosis present

## 2018-02-22 DIAGNOSIS — Z7982 Long term (current) use of aspirin: Secondary | ICD-10-CM

## 2018-02-22 DIAGNOSIS — Z96649 Presence of unspecified artificial hip joint: Secondary | ICD-10-CM

## 2018-02-22 DIAGNOSIS — Z87891 Personal history of nicotine dependence: Secondary | ICD-10-CM | POA: Diagnosis not present

## 2018-02-22 DIAGNOSIS — M169 Osteoarthritis of hip, unspecified: Secondary | ICD-10-CM | POA: Diagnosis present

## 2018-02-22 HISTORY — PX: TOTAL HIP ARTHROPLASTY: SHX124

## 2018-02-22 LAB — TYPE AND SCREEN
ABO/RH(D): A POS
Antibody Screen: NEGATIVE

## 2018-02-22 SURGERY — ARTHROPLASTY, HIP, TOTAL, ANTERIOR APPROACH
Anesthesia: Spinal | Site: Hip | Laterality: Right

## 2018-02-22 MED ORDER — DEXAMETHASONE SODIUM PHOSPHATE 10 MG/ML IJ SOLN
10.0000 mg | Freq: Once | INTRAMUSCULAR | Status: AC
  Administered 2018-02-23: 10 mg via INTRAVENOUS
  Filled 2018-02-22: qty 1

## 2018-02-22 MED ORDER — ACETAMINOPHEN 10 MG/ML IV SOLN
1000.0000 mg | Freq: Once | INTRAVENOUS | Status: AC
  Administered 2018-02-22: 1000 mg via INTRAVENOUS
  Filled 2018-02-22: qty 100

## 2018-02-22 MED ORDER — HYDROMORPHONE HCL 1 MG/ML IJ SOLN
INTRAMUSCULAR | Status: AC
  Administered 2018-02-22: 0.5 mg via INTRAVENOUS
  Filled 2018-02-22: qty 1

## 2018-02-22 MED ORDER — MENTHOL 3 MG MT LOZG: 1.0000 | LOZENGE | OROMUCOSAL | Status: DC | PRN

## 2018-02-22 MED ORDER — FENTANYL CITRATE (PF) 100 MCG/2ML IJ SOLN
INTRAMUSCULAR | Status: DC | PRN
  Administered 2018-02-22: 25 ug via INTRAVENOUS
  Administered 2018-02-22: 75 ug via INTRAVENOUS

## 2018-02-22 MED ORDER — 0.9 % SODIUM CHLORIDE (POUR BTL) OPTIME
TOPICAL | Status: DC | PRN
  Administered 2018-02-22: 1000 mL

## 2018-02-22 MED ORDER — BUPIVACAINE-EPINEPHRINE 0.25% -1:200000 IJ SOLN
INTRAMUSCULAR | Status: AC
  Filled 2018-02-22: qty 1

## 2018-02-22 MED ORDER — TRAMADOL HCL 50 MG PO TABS: 50.0000 mg | ORAL_TABLET | Freq: Four times a day (QID) | ORAL | Status: DC | PRN

## 2018-02-22 MED ORDER — FLEET ENEMA 7-19 GM/118ML RE ENEM: 1.0000 | ENEMA | Freq: Once | RECTAL | Status: DC | PRN

## 2018-02-22 MED ORDER — METHOCARBAMOL 500 MG PO TABS: 500.0000 mg | ORAL_TABLET | Freq: Four times a day (QID) | ORAL | Status: DC | PRN

## 2018-02-22 MED ORDER — PROPOFOL 10 MG/ML IV BOLUS
INTRAVENOUS | Status: AC
  Filled 2018-02-22: qty 20

## 2018-02-22 MED ORDER — HYDROMORPHONE HCL 1 MG/ML IJ SOLN
0.2500 mg | INTRAMUSCULAR | Status: DC | PRN
  Administered 2018-02-22 (×2): 0.5 mg via INTRAVENOUS

## 2018-02-22 MED ORDER — AMLODIPINE BESYLATE 5 MG PO TABS
5.0000 mg | ORAL_TABLET | Freq: Every day | ORAL | Status: DC
  Administered 2018-02-23: 5 mg via ORAL
  Filled 2018-02-22: qty 1

## 2018-02-22 MED ORDER — DEXAMETHASONE SODIUM PHOSPHATE 10 MG/ML IJ SOLN
10.0000 mg | Freq: Once | INTRAMUSCULAR | Status: AC
  Administered 2018-02-22: 10 mg via INTRAVENOUS

## 2018-02-22 MED ORDER — FLUTICASONE PROPIONATE 50 MCG/ACT NA SUSP: 2.0000 | Freq: Every day | NASAL | Status: DC | PRN

## 2018-02-22 MED ORDER — CEFAZOLIN SODIUM-DEXTROSE 2-4 GM/100ML-% IV SOLN
2.0000 g | Freq: Four times a day (QID) | INTRAVENOUS | Status: AC
  Administered 2018-02-22 (×2): 2 g via INTRAVENOUS
  Filled 2018-02-22 (×2): qty 100

## 2018-02-22 MED ORDER — ONDANSETRON HCL 4 MG PO TABS: 4.0000 mg | ORAL_TABLET | Freq: Four times a day (QID) | ORAL | Status: DC | PRN

## 2018-02-22 MED ORDER — PRAVASTATIN SODIUM 20 MG PO TABS
20.0000 mg | ORAL_TABLET | Freq: Every day | ORAL | Status: DC
  Administered 2018-02-23: 20 mg via ORAL
  Filled 2018-02-22: qty 1

## 2018-02-22 MED ORDER — FENTANYL CITRATE (PF) 100 MCG/2ML IJ SOLN
INTRAMUSCULAR | Status: AC
  Filled 2018-02-22: qty 2

## 2018-02-22 MED ORDER — RIVAROXABAN 10 MG PO TABS
10.0000 mg | ORAL_TABLET | Freq: Every day | ORAL | Status: DC
  Administered 2018-02-23: 09:00:00 10 mg via ORAL
  Filled 2018-02-22: qty 1

## 2018-02-22 MED ORDER — BUPIVACAINE IN DEXTROSE 0.75-8.25 % IT SOLN
INTRATHECAL | Status: DC | PRN
  Administered 2018-02-22: 1.8 mL via INTRATHECAL

## 2018-02-22 MED ORDER — BUPIVACAINE-EPINEPHRINE (PF) 0.25% -1:200000 IJ SOLN
INTRAMUSCULAR | Status: DC | PRN
  Administered 2018-02-22: 30 mL

## 2018-02-22 MED ORDER — ONDANSETRON HCL 4 MG/2ML IJ SOLN
4.0000 mg | Freq: Four times a day (QID) | INTRAMUSCULAR | Status: DC | PRN
  Administered 2018-02-22: 18:00:00 4 mg via INTRAVENOUS
  Filled 2018-02-22: qty 2

## 2018-02-22 MED ORDER — POLYETHYLENE GLYCOL 3350 17 G PO PACK: 17.0000 g | PACK | Freq: Every day | ORAL | Status: DC | PRN

## 2018-02-22 MED ORDER — MIDAZOLAM HCL 5 MG/5ML IJ SOLN
INTRAMUSCULAR | Status: DC | PRN
  Administered 2018-02-22: 2 mg via INTRAVENOUS

## 2018-02-22 MED ORDER — ACETAMINOPHEN 650 MG RE SUPP: 650.0000 mg | RECTAL | Status: DC | PRN

## 2018-02-22 MED ORDER — METOCLOPRAMIDE HCL 5 MG PO TABS: 5.0000 mg | ORAL_TABLET | Freq: Three times a day (TID) | ORAL | Status: DC | PRN

## 2018-02-22 MED ORDER — STERILE WATER FOR IRRIGATION IR SOLN
Status: DC | PRN
  Administered 2018-02-22: 2000 mL

## 2018-02-22 MED ORDER — FAMOTIDINE 10 MG PO TABS
10.0000 mg | ORAL_TABLET | Freq: Every day | ORAL | Status: DC | PRN
  Filled 2018-02-22: qty 1

## 2018-02-22 MED ORDER — CEFAZOLIN SODIUM-DEXTROSE 2-4 GM/100ML-% IV SOLN
2.0000 g | INTRAVENOUS | Status: AC
  Administered 2018-02-22: 2 g via INTRAVENOUS
  Filled 2018-02-22: qty 100

## 2018-02-22 MED ORDER — METHOCARBAMOL 1000 MG/10ML IJ SOLN
500.0000 mg | Freq: Four times a day (QID) | INTRAVENOUS | Status: DC | PRN
  Administered 2018-02-22: 500 mg via INTRAVENOUS
  Filled 2018-02-22: qty 550

## 2018-02-22 MED ORDER — PROPOFOL 500 MG/50ML IV EMUL
INTRAVENOUS | Status: DC | PRN
  Administered 2018-02-22: 25 ug/kg/min via INTRAVENOUS

## 2018-02-22 MED ORDER — SODIUM CHLORIDE 0.9 % IV SOLN
INTRAVENOUS | Status: DC
  Administered 2018-02-22: 1000 mL via INTRAVENOUS

## 2018-02-22 MED ORDER — PROPOFOL 10 MG/ML IV BOLUS
INTRAVENOUS | Status: AC
  Filled 2018-02-22: qty 40

## 2018-02-22 MED ORDER — PHENOL 1.4 % MT LIQD: 1.0000 | OROMUCOSAL | Status: DC | PRN

## 2018-02-22 MED ORDER — LACTATED RINGERS IV SOLN
INTRAVENOUS | Status: DC
  Administered 2018-02-22 (×2): via INTRAVENOUS

## 2018-02-22 MED ORDER — TRANEXAMIC ACID 1000 MG/10ML IV SOLN
1000.0000 mg | Freq: Once | INTRAVENOUS | Status: AC
  Administered 2018-02-22: 1000 mg via INTRAVENOUS
  Filled 2018-02-22: qty 1100

## 2018-02-22 MED ORDER — METOCLOPRAMIDE HCL 5 MG/ML IJ SOLN: 5.0000 mg | Freq: Three times a day (TID) | INTRAMUSCULAR | Status: DC | PRN

## 2018-02-22 MED ORDER — ONDANSETRON HCL 4 MG/2ML IJ SOLN
INTRAMUSCULAR | Status: DC | PRN
  Administered 2018-02-22: 4 mg via INTRAVENOUS

## 2018-02-22 MED ORDER — PHENYLEPHRINE 40 MCG/ML (10ML) SYRINGE FOR IV PUSH (FOR BLOOD PRESSURE SUPPORT)
PREFILLED_SYRINGE | INTRAVENOUS | Status: DC | PRN
  Administered 2018-02-22 (×8): 80 ug via INTRAVENOUS

## 2018-02-22 MED ORDER — BUPIVACAINE IN DEXTROSE 0.75-8.25 % IT SOLN: INTRATHECAL | Status: DC | PRN

## 2018-02-22 MED ORDER — DEXAMETHASONE SODIUM PHOSPHATE 10 MG/ML IJ SOLN
INTRAMUSCULAR | Status: AC
  Filled 2018-02-22: qty 4

## 2018-02-22 MED ORDER — MEPERIDINE HCL 50 MG/ML IJ SOLN: 6.2500 mg | INTRAMUSCULAR | Status: DC | PRN

## 2018-02-22 MED ORDER — KETOROLAC TROMETHAMINE 30 MG/ML IJ SOLN: 30.0000 mg | Freq: Once | INTRAMUSCULAR | Status: DC | PRN

## 2018-02-22 MED ORDER — OXYCODONE HCL 5 MG PO TABS
10.0000 mg | ORAL_TABLET | ORAL | Status: DC | PRN
  Administered 2018-02-22 – 2018-02-23 (×4): 10 mg via ORAL
  Filled 2018-02-22 (×5): qty 2

## 2018-02-22 MED ORDER — IRBESARTAN 150 MG PO TABS
300.0000 mg | ORAL_TABLET | Freq: Every day | ORAL | Status: DC
  Administered 2018-02-23: 09:00:00 300 mg via ORAL
  Filled 2018-02-22: qty 2

## 2018-02-22 MED ORDER — PROMETHAZINE HCL 25 MG/ML IJ SOLN: 6.2500 mg | INTRAMUSCULAR | Status: DC | PRN

## 2018-02-22 MED ORDER — CHLORHEXIDINE GLUCONATE 4 % EX LIQD: 60.0000 mL | Freq: Once | CUTANEOUS | Status: DC

## 2018-02-22 MED ORDER — ALLOPURINOL 300 MG PO TABS
300.0000 mg | ORAL_TABLET | Freq: Every day | ORAL | Status: DC
  Administered 2018-02-23: 09:00:00 300 mg via ORAL
  Filled 2018-02-22: qty 1

## 2018-02-22 MED ORDER — ACETAMINOPHEN 500 MG PO TABS
1000.0000 mg | ORAL_TABLET | Freq: Four times a day (QID) | ORAL | Status: AC
  Administered 2018-02-22 – 2018-02-23 (×3): 1000 mg via ORAL
  Filled 2018-02-22 (×3): qty 2

## 2018-02-22 MED ORDER — PHENYLEPHRINE HCL 10 MG/ML IJ SOLN
INTRAMUSCULAR | Status: AC
  Filled 2018-02-22: qty 6

## 2018-02-22 MED ORDER — ONDANSETRON HCL 4 MG/2ML IJ SOLN
INTRAMUSCULAR | Status: AC
  Filled 2018-02-22: qty 8

## 2018-02-22 MED ORDER — DOCUSATE SODIUM 100 MG PO CAPS
100.0000 mg | ORAL_CAPSULE | Freq: Two times a day (BID) | ORAL | Status: DC
  Administered 2018-02-23: 09:00:00 100 mg via ORAL
  Filled 2018-02-22: qty 1

## 2018-02-22 MED ORDER — ACETAMINOPHEN 325 MG PO TABS: 650.0000 mg | ORAL_TABLET | ORAL | Status: DC | PRN

## 2018-02-22 MED ORDER — OXYCODONE HCL 5 MG PO TABS
5.0000 mg | ORAL_TABLET | ORAL | Status: DC | PRN
  Administered 2018-02-22 (×2): 5 mg via ORAL

## 2018-02-22 MED ORDER — PHENYLEPHRINE 40 MCG/ML (10ML) SYRINGE FOR IV PUSH (FOR BLOOD PRESSURE SUPPORT)
PREFILLED_SYRINGE | INTRAVENOUS | Status: AC
  Filled 2018-02-22: qty 10

## 2018-02-22 MED ORDER — TAMSULOSIN HCL 0.4 MG PO CAPS
0.4000 mg | ORAL_CAPSULE | Freq: Every day | ORAL | Status: DC
  Administered 2018-02-22 – 2018-02-23 (×2): 0.4 mg via ORAL
  Filled 2018-02-22 (×2): qty 1

## 2018-02-22 MED ORDER — MIDAZOLAM HCL 2 MG/2ML IJ SOLN
INTRAMUSCULAR | Status: AC
  Filled 2018-02-22: qty 2

## 2018-02-22 MED ORDER — BISACODYL 10 MG RE SUPP: 10.0000 mg | Freq: Every day | RECTAL | Status: DC | PRN

## 2018-02-22 MED ORDER — DIPHENHYDRAMINE HCL 12.5 MG/5ML PO ELIX: 12.5000 mg | ORAL_SOLUTION | ORAL | Status: DC | PRN

## 2018-02-22 MED ORDER — MORPHINE SULFATE (PF) 2 MG/ML IV SOLN
1.0000 mg | INTRAVENOUS | Status: DC | PRN
  Administered 2018-02-22: 15:00:00 1 mg via INTRAVENOUS
  Filled 2018-02-22: qty 1

## 2018-02-22 SURGICAL SUPPLY — 39 items
BAG ZIPLOCK 12X15 (MISCELLANEOUS) ×3 IMPLANT
BLADE CLIPPER SURG (BLADE) ×3 IMPLANT
BLADE SAG 18X100X1.27 (BLADE) ×3 IMPLANT
CAPT HIP TOTAL 2 ×3 IMPLANT
CLOSURE WOUND 1/2 X4 (GAUZE/BANDAGES/DRESSINGS) ×2
CLOTH BEACON ORANGE TIMEOUT ST (SAFETY) ×3 IMPLANT
COVER PERINEAL POST (MISCELLANEOUS) ×3 IMPLANT
COVER SURGICAL LIGHT HANDLE (MISCELLANEOUS) ×3 IMPLANT
DECANTER SPIKE VIAL GLASS SM (MISCELLANEOUS) ×3 IMPLANT
DRAPE STERI IOBAN 125X83 (DRAPES) ×3 IMPLANT
DRAPE TOP SHEET (DRAPES) ×3 IMPLANT
DRAPE U-SHAPE 47X51 STRL (DRAPES) ×6 IMPLANT
DRSG ADAPTIC 3X8 NADH LF (GAUZE/BANDAGES/DRESSINGS) ×3 IMPLANT
DRSG MEPILEX BORDER 4X4 (GAUZE/BANDAGES/DRESSINGS) ×3 IMPLANT
DRSG MEPILEX BORDER 4X8 (GAUZE/BANDAGES/DRESSINGS) ×3 IMPLANT
DURAPREP 26ML APPLICATOR (WOUND CARE) ×3 IMPLANT
ELECT REM PT RETURN 15FT ADLT (MISCELLANEOUS) ×3 IMPLANT
EVACUATOR 1/8 PVC DRAIN (DRAIN) ×3 IMPLANT
GLOVE BIO SURGEON STRL SZ7.5 (GLOVE) ×3 IMPLANT
GLOVE BIO SURGEON STRL SZ8 (GLOVE) ×6 IMPLANT
GLOVE BIOGEL PI IND STRL 7.0 (GLOVE) ×2 IMPLANT
GLOVE BIOGEL PI IND STRL 7.5 (GLOVE) ×2 IMPLANT
GLOVE BIOGEL PI IND STRL 8 (GLOVE) ×2 IMPLANT
GLOVE BIOGEL PI INDICATOR 7.0 (GLOVE) ×4
GLOVE BIOGEL PI INDICATOR 7.5 (GLOVE) ×4
GLOVE BIOGEL PI INDICATOR 8 (GLOVE) ×4
GLOVE SURG SS PI 7.0 STRL IVOR (GLOVE) ×3 IMPLANT
GOWN STRL REUS W/TWL LRG LVL3 (GOWN DISPOSABLE) ×6 IMPLANT
GOWN STRL REUS W/TWL XL LVL3 (GOWN DISPOSABLE) ×6 IMPLANT
PACK ANTERIOR HIP CUSTOM (KITS) ×3 IMPLANT
STRIP CLOSURE SKIN 1/2X4 (GAUZE/BANDAGES/DRESSINGS) ×4 IMPLANT
SUT ETHIBOND NAB CT1 #1 30IN (SUTURE) ×3 IMPLANT
SUT MNCRL AB 4-0 PS2 18 (SUTURE) ×3 IMPLANT
SUT STRATAFIX 0 PDS 27 VIOLET (SUTURE) ×3
SUT VIC AB 2-0 CT1 27 (SUTURE) ×4
SUT VIC AB 2-0 CT1 TAPERPNT 27 (SUTURE) ×2 IMPLANT
SUTURE STRATFX 0 PDS 27 VIOLET (SUTURE) ×1 IMPLANT
TRAY FOLEY W/METER SILVER 16FR (SET/KITS/TRAYS/PACK) ×3 IMPLANT
YANKAUER SUCT BULB TIP 10FT TU (MISCELLANEOUS) ×3 IMPLANT

## 2018-02-22 NOTE — Interval H&P Note (Signed)
History and Physical Interval Note:  02/22/2018 6:25 AM  John Potts  has presented today for surgery, with the diagnosis of Osteoarthritis Right hip  The various methods of treatment have been discussed with the patient and family. After consideration of risks, benefits and other options for treatment, the patient has consented to  Procedure(s): RIGHT TOTAL HIP ARTHROPLASTY ANTERIOR APPROACH (Right) as a surgical intervention .  The patient's history has been reviewed, patient examined, no change in status, stable for surgery.  I have reviewed the patient's chart and labs.  Questions were answered to the patient's satisfaction.     Pilar Plate Monae Topping

## 2018-02-22 NOTE — Progress Notes (Signed)
Patient placed on CPAP/BiPAP machine with 2 L bleed in. Patient comfortable at this time.

## 2018-02-22 NOTE — Progress Notes (Signed)
Transported pt to room with coat and 2 bags.

## 2018-02-22 NOTE — Op Note (Signed)
OPERATIVE REPORT- TOTAL HIP ARTHROPLASTY   PREOPERATIVE DIAGNOSIS: Osteoarthritis of the Right hip.   POSTOPERATIVE DIAGNOSIS: Osteoarthritis of the Right  hip.   PROCEDURE: Right total hip arthroplasty, anterior approach.   SURGEON: Gaynelle Arabian, MD   ASSISTANT: Arlee Muslim, PA-C  ANESTHESIA:  Spinal  ESTIMATED BLOOD LOSS:-150 mL    DRAINS: Hemovac x1.   COMPLICATIONS: None   CONDITION: PACU - hemodynamically stable.   BRIEF CLINICAL NOTE: John Potts is a 72 y.o. male who has advanced end-  stage arthritis of their Right  hip with progressively worsening pain and  dysfunction.The patient has failed nonoperative management and presents for  total hip arthroplasty.   PROCEDURE IN DETAIL: After successful administration of spinal  anesthetic, the traction boots for the [facility] bed were placed on both  feet and the patient was placed onto the [facility] bed, boots placed into the leg  holders. The Right hip was then isolated from the perineum with plastic  drapes and prepped and draped in the usual sterile fashion. ASIS and  greater trochanter were marked and a oblique incision was made, starting  at about 1 cm lateral and 2 cm distal to the ASIS and coursing towards  the anterior cortex of the femur. The skin was cut with a 10 blade  through subcutaneous tissue to the level of the fascia overlying the  tensor fascia lata muscle. The fascia was then incised in line with the  incision at the junction of the anterior third and posterior 2/3rd. The  muscle was teased off the fascia and then the interval between the TFL  and the rectus was developed. The Hohmann retractor was then placed at  the top of the femoral neck over the capsule. The vessels overlying the  capsule were cauterized and the fat on top of the capsule was removed.  A Hohmann retractor was then placed anterior underneath the rectus  femoris to give exposure to the entire anterior capsule. A T-shaped   capsulotomy was performed. The edges were tagged and the femoral head  was identified.       Osteophytes are removed off the superior acetabulum.  The femoral neck was then cut in situ with an oscillating saw. Traction  was then applied to the left lower extremity utilizing the [facility]  traction. The femoral head was then removed. Retractors were placed  around the acetabulum and then circumferential removal of the labrum was  performed. Osteophytes were also removed. Reaming starts at 49 mm to  medialize and  Increased in 2 mm increments to 53 mm. We reamed in  approximately 40 degrees of abduction, 20 degrees anteversion. A 54 mm  pinnacle acetabular shell was then impacted in anatomic position under  fluoroscopic guidance with excellent purchase. We did not need to place  any additional dome screws. A 36 mm neutral + 4 marathon liner was then  placed into the acetabular shell.       The femoral lift was then placed along the lateral aspect of the femur  just distal to the vastus ridge. The leg was  externally rotated and capsule  was stripped off the inferior aspect of the femoral neck down to the  level of the lesser trochanter, this was done with electrocautery. The femur was lifted after this was performed. The  leg was then placed in an extended and adducted position essentially delivering the femur. We also removed the capsule superiorly and the piriformis from the piriformis  fossa to gain excellent exposure of the  proximal femur. Rongeur was used to remove some cancellous bone to get  into the lateral portion of the proximal femur for placement of the  initial starter reamer. The starter broaches was placed  the starter broach  and was shown to go down the center of the canal. Broaching  with the  Corail system was then performed starting at size 8, coursing  Up to size 14. A size 14 had excellent torsional and rotational  and axial stability. The trial high offset neck was then  placed  with a 36 + 5 trial head. The hip was then reduced. We confirmed that  the stem was in the canal both on AP and lateral x-rays. It also has excellent sizing. The hip was reduced with outstanding stability through full extension and full external rotation.. AP pelvis was taken and the leg lengths were measured and found to be equal. Hip was then dislocated again and the femoral head and neck removed. The  femoral broach was removed. Size 14 [place] stem with a high offset  neck was then impacted into the femur following native anteversion. Has  excellent purchase in the canal. Excellent torsional and rotational and  axial stability. It is confirmed to be in the canal on AP and lateral  fluoroscopic views. The 36 + 5 ceramic head was placed and the hip  reduced with outstanding stability. Again AP pelvis was taken and it  confirmed that the leg lengths were equal. The wound was then copiously  irrigated with saline solution and the capsule reattached and repaired  with Ethibond suture. 30 ml of .25% Bupivicaine was  injected into the capsule and into the edge of the tensor fascia lata as well as subcutaneous tissue. The fascia overlying the tensor fascia lata was then closed with a running #1 V-Loc. Subcu was closed with interrupted 2-0 Vicryl and subcuticular running 4-0 Monocryl. Incision was cleaned  and dried. Steri-Strips and a bulky sterile dressing applied. Hemovac  drain was hooked to suction and then the patient was awakened and transported to  recovery in stable condition.        Please note that a surgical assistant was a medical necessity for this procedure to perform it in a safe and expeditious manner. Assistant was necessary to provide appropriate retraction of vital neurovascular structures and to prevent femoral fracture and allow for anatomic placement of the prosthesis.  Gaynelle Arabian, M.D.

## 2018-02-22 NOTE — Discharge Instructions (Addendum)
Dr. Gaynelle Arabian Total Joint Specialist [facility] [address], [place], [place] [address] [phone]  ANTERIOR APPROACH TOTAL HIP REPLACEMENT POSTOPERATIVE DIRECTIONS   Hip Rehabilitation, Guidelines Following Surgery  The results of a hip operation are greatly improved after range of motion and muscle strengthening exercises. Follow all safety measures which are given to protect your hip. If any of these exercises cause increased pain or swelling in your joint, decrease the amount until you are comfortable again. Then slowly increase the exercises. Call your caregiver if you have problems or questions.   HOME CARE INSTRUCTIONS  Remove items at home which could result in a fall. This includes throw rugs or furniture in walking pathways.   ICE to the affected hip every three hours for 30 minutes at a time and then as needed for pain and swelling.  Continue to use ice on the hip for pain and swelling from surgery. You may notice swelling that will progress down to the foot and ankle.  This is normal after surgery.  Elevate the leg when you are not up walking on it.    Continue to use the breathing machine which will help keep your temperature down.  It is common for your temperature to cycle up and down following surgery, especially at night when you are not up moving around and exerting yourself.  The breathing machine keeps your lungs expanded and your temperature down.   DIET You may resume your previous home diet once your are discharged from the hospital.  DRESSING / WOUND CARE / SHOWERING You may shower 3 days after surgery, but keep the wounds dry during showering.  You may use an occlusive plastic wrap (Press'n Seal for example), NO SOAKING/SUBMERGING IN THE BATHTUB.  If the bandage gets wet, change with a clean dry gauze.  If the incision gets wet, pat the wound dry with a clean towel. You may start showering once you are discharged home but do not submerge  the incision under water. Just pat the incision dry and apply a dry gauze dressing on daily. Change the surgical dressing daily and reapply a dry dressing each time.  ACTIVITY Walk with your walker as instructed. Use walker as long as suggested by your caregivers. Avoid periods of inactivity such as sitting longer than an hour when not asleep. This helps prevent blood clots.  You may resume a sexual relationship in one month or when given the OK by your doctor.  You may return to work once you are cleared by your doctor.  Do not drive a car for 6 weeks or until released by you surgeon.  Do not drive while taking narcotics.  WEIGHT BEARING Weight bearing as tolerated with assist device (walker, cane, etc) as directed, use it as long as suggested by your surgeon or therapist, typically at least 4-6 weeks.  POSTOPERATIVE CONSTIPATION PROTOCOL Constipation - defined medically as fewer than three stools per week and severe constipation as less than one stool per week.  One of the most common issues patients have following surgery is constipation.  Even if you have a regular bowel pattern at home, your normal regimen is likely to be disrupted due to multiple reasons following surgery.  Combination of anesthesia, postoperative narcotics, change in appetite and fluid intake all can affect your bowels.  In order to avoid complications following surgery, here are some recommendations in order to help you during your recovery period.  Colace (docusate) - Pick up an over-the-counter  form of Colace or another stool softener and take twice a day as long as you are requiring postoperative pain medications.  Take with a full glass of water daily.  If you experience loose stools or diarrhea, hold the colace until you stool forms back up.  If your symptoms do not get better within 1 week or if they get worse, check with your doctor.  Dulcolax (bisacodyl) - Pick up over-the-counter and take as directed by the  product packaging as needed to assist with the movement of your bowels.  Take with a full glass of water.  Use this product as needed if not relieved by Colace only.   MiraLax (polyethylene glycol) - Pick up over-the-counter to have on hand.  MiraLax is a solution that will increase the amount of water in your bowels to assist with bowel movements.  Take as directed and can mix with a glass of water, juice, soda, coffee, or tea.  Take if you go more than two days without a movement. Do not use MiraLax more than once per day. Call your doctor if you are still constipated or irregular after using this medication for 7 days in a row.  If you continue to have problems with postoperative constipation, please contact the office for further assistance and recommendations.  If you experience "the worst abdominal pain ever" or develop nausea or vomiting, please contact the office immediatly for further recommendations for treatment.  ITCHING  If you experience itching with your medications, try taking only a single pain pill, or even half a pain pill at a time.  You can also use Benadryl over the counter for itching or also to help with sleep.   TED HOSE STOCKINGS Wear the elastic stockings on both legs for three weeks following surgery during the day but you may remove then at night for sleeping.  MEDICATIONS See your medication summary on the After Visit Summary that the nursing staff will review with you prior to discharge.  You may have some home medications which will be placed on hold until you complete the course of blood thinner medication.  It is important for you to complete the blood thinner medication as prescribed by your surgeon.  Continue your approved medications as instructed at time of discharge.  PRECAUTIONS If you experience chest pain or shortness of breath - call 911 immediately for transfer to the hospital emergency department.  If you develop a fever greater that 101 F, purulent  drainage from wound, increased redness or drainage from wound, foul odor from the wound/dressing, or calf pain - CONTACT YOUR SURGEON.                                                   FOLLOW-UP APPOINTMENTS Make sure you keep all of your appointments after your operation with your surgeon and caregivers. You should call the office at the above phone number and make an appointment for approximately two weeks after the date of your surgery or on the date instructed by your surgeon outlined in the "After Visit Summary".  RANGE OF MOTION AND STRENGTHENING EXERCISES  These exercises are designed to help you keep full movement of your hip joint. Follow your caregiver's or physical therapist's instructions. Perform all exercises about fifteen times, three times per day or as directed. Exercise both hips, even if you  have had only one joint replacement. These exercises can be done on a training (exercise) mat, on the floor, on a table or on a bed. Use whatever works the best and is most comfortable for you. Use music or television while you are exercising so that the exercises are a pleasant break in your day. This will make your life better with the exercises acting as a break in routine you can look forward to.  Lying on your back, slowly slide your foot toward your buttocks, raising your knee up off the floor. Then slowly slide your foot back down until your leg is straight again.  Lying on your back spread your legs as far apart as you can without causing discomfort.  Lying on your side, raise your upper leg and foot straight up from the floor as far as is comfortable. Slowly lower the leg and repeat.  Lying on your back, tighten up the muscle in the front of your thigh (quadriceps muscles). You can do this by keeping your leg straight and trying to raise your heel off the floor. This helps strengthen the largest muscle supporting your knee.  Lying on your back, tighten up the muscles of your buttocks both  with the legs straight and with the knee bent at a comfortable angle while keeping your heel on the floor.   IF YOU ARE TRANSFERRED TO A SKILLED REHAB FACILITY If the patient is transferred to a skilled rehab facility following release from the hospital, a list of the current medications will be sent to the facility for the patient to continue.  When discharged from the skilled rehab facility, please have the facility set up the patient's [place] prior to being released. Also, the skilled facility will be responsible for providing the patient with their medications at time of release from the facility to include their pain medication, the muscle relaxants, and their blood thinner medication. If the patient is still at the rehab facility at time of the two week follow up appointment, the skilled rehab facility will also need to assist the patient in arranging follow up appointment in our office and any transportation needs.  MAKE SURE YOU:  Understand these instructions.  Get help right away if you are not doing well or get worse.    Pick up stool softner and laxative for home use following surgery while on pain medications. Do not submerge incision under water. Please use good hand washing techniques while changing dressing each day. May shower starting three days after surgery. Please use a clean towel to pat the incision dry following showers. Continue to use ice for pain and swelling after surgery. Do not use any lotions or creams on the incision until instructed by your surgeon.  Take Xarelto for two and a half more weeks following discharge from the hospital, then discontinue Xarelto. Once the patient has completed the Xarelto, they may resume the 81 mg Aspirin.    Information on my medicine - XARELTO (Rivaroxaban)  This medication education was reviewed with me or my healthcare representative as part of my discharge preparation.   Why was Xarelto prescribed for  you? Xarelto was prescribed for you to reduce the risk of blood clots forming after orthopedic surgery. The medical term for these abnormal blood clots is venous thromboembolism (VTE).  What do you need to know about xarelto ? Take your Xarelto ONCE DAILY at the same time every day. You may take it either with or without food.  If you have difficulty swallowing the tablet whole, you may crush it and mix in applesauce just prior to taking your dose.  Take Xarelto exactly as prescribed by your doctor and DO NOT stop taking Xarelto without talking to the doctor who prescribed the medication.  Stopping without other VTE prevention medication to take the place of Xarelto may increase your risk of developing a clot.  After discharge, you should have regular check-up appointments with your healthcare provider that is prescribing your Xarelto.    What do you do if you miss a dose? If you miss a dose, take it as soon as you remember on the same day then continue your regularly scheduled once daily regimen the next day. Do not take two doses of Xarelto on the same day.   Important Safety Information A possible side effect of Xarelto is bleeding. You should call your healthcare provider right away if you experience any of the following: ? Bleeding from an injury or your nose that does not stop. ? Unusual colored urine (red or dark brown) or unusual colored stools (red or black). ? Unusual bruising for unknown reasons. ? A serious fall or if you hit your head (even if there is no bleeding).  Some medicines may interact with Xarelto and might increase your risk of bleeding while on Xarelto. To help avoid this, consult your healthcare provider or pharmacist prior to using any new prescription or non-prescription medications, including herbals, vitamins, non-steroidal anti-inflammatory drugs (NSAIDs) and supplements.  This website has more information on Xarelto: [email or web address]

## 2018-02-22 NOTE — Transfer of Care (Signed)
Immediate Anesthesia Transfer of Care Note  Patient: John Potts  Procedure(s) Performed: RIGHT TOTAL HIP ARTHROPLASTY ANTERIOR APPROACH (Right Hip)  Patient Location: PACU  Anesthesia Type:Spinal  Level of Consciousness: awake, alert  and oriented  Airway & Oxygen Therapy: Patient Spontanous Breathing and Patient connected to face mask oxygen  Post-op Assessment: Report given to RN  Post vital signs: Reviewed and stable  Last Vitals:  Vitals:   02/22/18 0543  BP: (!) 152/82  Pulse: 91  Resp: 16  Temp: 36.9 C  SpO2: 97%    Last Pain:  Vitals:   02/22/18 0559  TempSrc:   PainSc: 1       Patients Stated Pain Goal: 4 (67/01/41 0301)  Complications: No apparent anesthesia complications

## 2018-02-22 NOTE — Anesthesia Postprocedure Evaluation (Signed)
Anesthesia Post Note  Patient: John Potts  Procedure(s) Performed: RIGHT TOTAL HIP ARTHROPLASTY ANTERIOR APPROACH (Right Hip)     Patient location during evaluation: PACU Anesthesia Type: Spinal Level of consciousness: awake and sedated Pain management: pain level controlled Vital Signs Assessment: post-procedure vital signs reviewed and stable Respiratory status: spontaneous breathing Cardiovascular status: stable Postop Assessment: no headache, no backache, spinal receding, patient able to bend at knees and no apparent nausea or vomiting Anesthetic complications: no    Last Vitals:  Vitals:   02/22/18 0945 02/22/18 1000  BP: 126/67 126/73  Pulse: 81 87  Resp: (!) 9 (!) 23  Temp:    SpO2: 99% 98%    Last Pain:  Vitals:   02/22/18 1015  TempSrc:   PainSc: 4    Pain Goal: Patients Stated Pain Goal: 4 (02/22/18 0559)               Agustin Swatek JR,JOHN Mate[facility] Flow

## 2018-02-22 NOTE — Anesthesia Procedure Notes (Signed)
Spinal  Start time: 02/22/2018 7:10 AM End time: 02/22/2018 7:20 AM Staffing Anesthesiologist: Lyn Hollingshead, MD Performed: anesthesiologist  Preanesthetic Checklist Completed: patient identified, site marked, surgical consent, pre-op evaluation, timeout performed, IV checked, risks and benefits discussed and monitors and equipment checked Spinal Block Patient position: sitting Prep: ChloraPrep and site prepped and draped Patient monitoring: continuous pulse ox and blood pressure Approach: midline Location: L2-3 Injection technique: single-shot Needle Needle type: Quincke  Needle gauge: 22 G Needle length: 9 cm Needle insertion depth: 5 cm Assessment Sensory level: T10

## 2018-02-22 NOTE — Evaluation (Signed)
Physical Therapy Evaluation Patient Details Name: John Potts MRN: [id number] DOB: 1946/11/20 Today's Date: 02/22/2018   History of Present Illness  Pt s/p R THR  Clinical Impression  Pt s/p R THR and presents with decreased R LE strength/ROM and post op pain limiting functional mobility.  Pt should progress to dc home with family assist.    Follow Up Recommendations Follow surgeon's recommendation for DC plan and follow-up therapies    Equipment Recommendations  None recommended by PT    Recommendations for Other Services OT consult     Precautions / Restrictions Precautions Precautions: Fall Restrictions Weight Bearing Restrictions: No Other Position/Activity Restrictions: WBAT      Mobility  Bed Mobility Overal bed mobility: Needs Assistance Bed Mobility: Supine to Sit     Supine to sit: Min assist     General bed mobility comments: cues for sequence and use of L LE to self assist  Transfers Overall transfer level: Needs assistance Equipment used: Rolling walker (2 wheeled) Transfers: Sit to/from Stand Sit to Stand: Min assist         General transfer comment: cues for LE management and use of UEs to self assist  Ambulation/Gait Ambulation/Gait assistance: Min assist;Min guard Ambulation Distance (Feet): 111 Feet Assistive device: Rolling walker (2 wheeled) Gait Pattern/deviations: Step-to pattern;Step-through pattern;Decreased step length - right;Decreased step length - left;Shuffle;Trunk flexed Gait velocity: decr Gait velocity interpretation: Below normal speed for age/gender General Gait Details: cues for posture, position from RW and sequence  Stairs            Wheelchair Mobility    Modified Rankin (Stroke Patients Only)       Balance Overall balance assessment: No apparent balance deficits (not formally assessed)                                           Pertinent Vitals/Pain Pain Assessment: 0-10 Pain Score:  4  Pain Location: R hip Pain Descriptors / Indicators: Aching;Sore Pain Intervention(s): Limited activity within patient's tolerance;Monitored during session;Premedicated before session;Ice applied    Home Living Family/patient expects to be discharged to:: Private residence Living Arrangements: Spouse/significant other Available Help at Discharge: Family Type of Home: House Home Access: Stairs to enter Entrance Stairs-Rails: None Entrance Stairs-Number of Steps: 5 Home Layout: One level Home Equipment: Environmental consultant - 2 wheels;Cane - single point;Crutches      Prior Function Level of Independence: Independent               Hand Dominance        Extremity/Trunk Assessment   Upper Extremity Assessment Upper Extremity Assessment: Overall WFL for tasks assessed    Lower Extremity Assessment Lower Extremity Assessment: RLE deficits/detail    Cervical / Trunk Assessment Cervical / Trunk Assessment: Normal  Communication   Communication: No difficulties  Cognition Arousal/Alertness: Awake/alert Behavior During Therapy: WFL for tasks assessed/performed Overall Cognitive Status: Within Functional Limits for tasks assessed                                        General Comments      Exercises Total Joint Exercises Ankle Circles/Pumps: AROM;Both;15 reps;Supine   Assessment/Plan    PT Assessment Patient needs continued PT services  PT Problem List Decreased strength;Decreased range of motion;Decreased activity tolerance;Decreased mobility;Decreased  knowledge of use of DME;Pain       PT Treatment Interventions DME instruction;Gait training;Stair training;Functional mobility training;Therapeutic activities;Therapeutic exercise;Patient/family education    PT Goals (Current goals can be found in the Care Plan section)  Acute Rehab PT Goals Patient Stated Goal: Regain IND and walk with less pain PT Goal Formulation: With patient Time For Goal  Achievement: 03/01/18 Potential to Achieve Goals: Good    Frequency 7X/week   Barriers to discharge        Co-evaluation               AM-PAC PT "6 Clicks" Daily Activity  Outcome Measure Difficulty turning over in bed (including adjusting bedclothes, sheets and blankets)?: Unable Difficulty moving from lying on back to sitting on the side of the bed? : Unable Difficulty sitting down on and standing up from a chair with arms (e.g., wheelchair, bedside commode, etc,.)?: Unable Help needed moving to and from a bed to chair (including a wheelchair)?: A Little Help needed walking in hospital room?: A Little Help needed climbing 3-5 steps with a railing? : A Little 6 Click Score: 12    End of Session Equipment Utilized During Treatment: Gait belt Activity Tolerance: Patient tolerated treatment well Patient left: in chair;with call bell/phone within reach;with family/visitor present Nurse Communication: Mobility status PT Visit Diagnosis: Difficulty in walking, not elsewhere classified (R26.2)    Time: 2641-5830 PT Time Calculation (min) (ACUTE ONLY): 23 min   Charges:   PT Evaluation $PT Eval Low Complexity: 1 Low PT Treatments $Gait Training: 8-22 mins   PT G Codes:        Pg [phone]   [name] 02/22/2018, 4:33 PM

## 2018-02-23 LAB — CBC
HCT: 34.2 % — ABNORMAL LOW (ref 39.0–52.0)
Hemoglobin: 11.6 g/dL — ABNORMAL LOW (ref 13.0–17.0)
MCH: 30.5 pg (ref 26.0–34.0)
MCHC: 33.9 g/dL (ref 30.0–36.0)
MCV: 90 fL (ref 78.0–100.0)
Platelets: 224 10*3/uL (ref 150–400)
RBC: 3.8 MIL/uL — ABNORMAL LOW (ref 4.22–5.81)
RDW: 13.3 % (ref 11.5–15.5)
WBC: 17.3 10*3/uL — ABNORMAL HIGH (ref 4.0–10.5)

## 2018-02-23 LAB — BASIC METABOLIC PANEL
Anion gap: 7 (ref 5–15)
BUN: 13 mg/dL (ref 6–20)
CALCIUM: 8.8 mg/dL — AB (ref 8.9–10.3)
CHLORIDE: 106 mmol/L (ref 101–111)
CO2: 26 mmol/L (ref 22–32)
Creatinine, Ser: 0.75 mg/dL (ref 0.61–1.24)
GFR calc non Af Amer: >60 mL/min (ref >60)
Glucose, Bld: 142 mg/dL — ABNORMAL HIGH (ref 65–99)
Potassium: 4.1 mmol/L (ref 3.5–5.1)
SODIUM: 139 mmol/L (ref 135–145)

## 2018-02-23 MED ORDER — RIVAROXABAN 10 MG PO TABS: 10.0000 mg | ORAL_TABLET | Freq: Every day | ORAL | 0 refills | Status: DC

## 2018-02-23 MED ORDER — OXYCODONE HCL 5 MG PO TABS: 5.0000 mg | ORAL_TABLET | ORAL | 0 refills | Status: DC | PRN

## 2018-02-23 MED ORDER — METHOCARBAMOL 500 MG PO TABS: 500.0000 mg | ORAL_TABLET | Freq: Four times a day (QID) | ORAL | 0 refills | Status: DC | PRN

## 2018-02-23 MED ORDER — TRAMADOL HCL 50 MG PO TABS: 50.0000 mg | ORAL_TABLET | Freq: Four times a day (QID) | ORAL | 0 refills | Status: DC | PRN

## 2018-02-23 NOTE — Discharge Summary (Signed)
Physician Discharge Summary   Patient ID: MONTERIO BOB MRN: [id number] DOB/AGE: Dec 20, 1946 72 y.o.  Admit date: 02/22/2018 Discharge date: 02-23-2018  Primary Diagnosis:  Osteoarthritis of the Right  hip.    Admission Diagnoses:  Past Medical History:  Diagnosis Date  . Complication of anesthesia    Difficult urinating  . Erectile dysfunction   . Gout   . Hyperlipidemia   . Hypertension   . Kidney stone   . Knee pain   . OSA on CPAP    Discharge Diagnoses:   Principal Problem:   OA (osteoarthritis) of hip  Estimated body mass index is 23.61 kg/m as calculated from the following:   Height as of this encounter: 5' 7.5" (1.715 m).   Weight as of this encounter: 69.4 kg (153 lb).  Procedure(s) (LRB): RIGHT TOTAL HIP ARTHROPLASTY ANTERIOR APPROACH (Right)   Consults: None  HPI: John Potts is a 72 y.o. male who has advanced end-  stage arthritis of their Right  hip with progressively worsening pain and  dysfunction.The patient has failed nonoperative management and presents for  total hip arthroplasty.    Laboratory Data: Admission on 02/22/2018  Component Date Value Ref Range Status  . WBC 02/23/2018 17.3* 4.0 - 10.5 K/uL Final  . RBC 02/23/2018 3.80* 4.22 - 5.81 MIL/uL Final  . Hemoglobin 02/23/2018 11.6* 13.0 - 17.0 g/dL Final  . HCT 02/23/2018 34.2* 39.0 - 52.0 % Final  . MCV 02/23/2018 90.0  78.0 - 100.0 fL Final  . MCH 02/23/2018 30.5  26.0 - 34.0 pg Final  . MCHC 02/23/2018 33.9  30.0 - 36.0 g/dL Final  . RDW 02/23/2018 13.3  11.5 - 15.5 % Final  . Platelets 02/23/2018 224  150 - 400 K/uL Final   Performed at [facility], [place] [address] [place], [place] [address]  . Sodium 02/23/2018 139  135 - 145 mmol/L Final  . Potassium 02/23/2018 4.1  3.5 - 5.1 mmol/L Final  . Chloride 02/23/2018 106  101 - 111 mmol/L Final  . CO2 02/23/2018 26  22 - 32 mmol/L Final  . Glucose, Bld 02/23/2018 142* 65 - 99 mg/dL Final  . BUN 02/23/2018 13  6 -  20 mg/dL Final  . Creatinine, Ser 02/23/2018 0.75  0.61 - 1.24 mg/dL Final  . Calcium 02/23/2018 8.8* 8.9 - 10.3 mg/dL Final  . GFR calc non Af Amer 02/23/2018 >60  >60 mL/min Final  . GFR calc Af Amer 02/23/2018 >60  >60 mL/min Final   Comment: (NOTE) The eGFR has been calculated using the CKD EPI equation. This calculation has not been validated in all clinical situations. eGFR's persistently <60 mL/min signify possible Chronic Kidney Disease.   Georgiann Hahn gap 02/23/2018 7  5 - 15 Final   Performed at [facility], [place] [address], [place], [place] [address]  Hospital Outpatient Visit on 02/17/2018  Component Date Value Ref Range Status  . aPTT 02/17/2018 31  24 - 36 seconds Final   Performed at [facility], [place] [address] [place], [place] [address]  . WBC 02/17/2018 7.6  4.0 - 10.5 K/uL Final  . RBC 02/17/2018 4.50  4.22 - 5.81 MIL/uL Final  . Hemoglobin 02/17/2018 13.3  13.0 - 17.0 g/dL Final  . HCT 02/17/2018 40.8  39.0 - 52.0 % Final  . MCV 02/17/2018 90.7  78.0 - 100.0 fL Final  . MCH 02/17/2018 29.6  26.0 - 34.0 pg Final  . MCHC 02/17/2018  32.6  30.0 - 36.0 g/dL Final  . RDW 02/17/2018 13.5  11.5 - 15.5 % Final  . Platelets 02/17/2018 236  150 - 400 K/uL Final   Performed at [facility], [place] [address] [place], [place] [address]  . Sodium 02/17/2018 142  135 - 145 mmol/L Final  . Potassium 02/17/2018 4.7  3.5 - 5.1 mmol/L Final  . Chloride 02/17/2018 106  101 - 111 mmol/L Final  . CO2 02/17/2018 26  22 - 32 mmol/L Final  . Glucose, Bld 02/17/2018 103* 65 - 99 mg/dL Final  . BUN 02/17/2018 17  6 - 20 mg/dL Final  . Creatinine, Ser 02/17/2018 0.91  0.61 - 1.24 mg/dL Final  . Calcium 02/17/2018 9.3  8.9 - 10.3 mg/dL Final  . Total Protein 02/17/2018 6.8  6.5 - 8.1 g/dL Final  . Albumin 02/17/2018 4.0  3.5 - 5.0 g/dL Final  . AST 02/17/2018 23  15 - 41 U/L Final  . ALT 02/17/2018 25  17 - 63 U/L Final  . Alkaline  Phosphatase 02/17/2018 74  38 - 126 U/L Final  . Total Bilirubin 02/17/2018 1.0  0.3 - 1.2 mg/dL Final  . GFR calc non Af Amer 02/17/2018 >60  >60 mL/min Final  . GFR calc Af Amer 02/17/2018 >60  >60 mL/min Final   Comment: (NOTE) The eGFR has been calculated using the CKD EPI equation. This calculation has not been validated in all clinical situations. eGFR's persistently <60 mL/min signify possible Chronic Kidney Disease.   Georgiann Hahn gap 02/17/2018 10  5 - 15 Final   Performed at [facility], [place] [address] [place], [place] [address]  . Prothrombin Time 02/17/2018 12.5  11.4 - 15.2 seconds Final  . INR 02/17/2018 0.94   Final   Performed at [facility], [place] [address] [place], [place] [address]  . ABO/RH(D) 02/17/2018 A POS   Final  . Antibody Screen 02/17/2018 NEG   Final  . Sample Expiration 02/17/2018 02/25/2018   Final  . Extend sample reason 02/17/2018    Final                   Value:NO TRANSFUSIONS OR PREGNANCY IN THE PAST 3 MONTHS Performed at [facility], [place] [address] [place], [place] [address]   . MRSA, PCR 02/17/2018 NEGATIVE  NEGATIVE Final  . Staphylococcus aureus 02/17/2018 NEGATIVE  NEGATIVE Final   Comment: (NOTE) The Xpert SA Assay (FDA approved for NASAL specimens in patients 40 years of age and older), is one component of a comprehensive surveillance program. It is not intended to diagnose infection nor to guide or monitor treatment. Performed at [facility], [place] [address], [place], [place] [address]   . ABO/RH(D) 02/17/2018    Final                   Value:A POS Performed at [facility], [place] [address], [place],  [address]      X-Rays:Dg Pelvis Portable  Result Date: 02/22/2018 CLINICAL DATA:  Total hip arthroplasty. EXAM: PORTABLE PELVIS 1-2 VIEWS; DG C-ARM 1-60 MIN-NO REPORT COMPARISON:  None. FINDINGS: Single intraoperative fluoroscopic spot  view of the right hip shows a right total hip arthroplasty. A portable image of the pelvis, done subsequently, shows a right total hip arthroplasty. Subcutaneous and joint air are noted. Surgical drain is in place. IMPRESSION: Right total hip arthroplasty with expected postoperative findings. Electronically Signed  By: Lorin Picket M.D.   On: 02/22/2018 10:47   Dg C-arm 1-60 Min-no Report  Result Date: 02/22/2018 CLINICAL DATA:  Total hip arthroplasty. EXAM: PORTABLE PELVIS 1-2 VIEWS; DG C-ARM 1-60 MIN-NO REPORT COMPARISON:  None. FINDINGS: Single intraoperative fluoroscopic spot view of the right hip shows a right total hip arthroplasty. A portable image of the pelvis, done subsequently, shows a right total hip arthroplasty. Subcutaneous and joint air are noted. Surgical drain is in place. IMPRESSION: Right total hip arthroplasty with expected postoperative findings. Electronically Signed   By: Lorin Picket M.D.   On: 02/22/2018 10:47    EKG: Orders placed or performed during the hospital encounter of 02/17/18  . EKG 12 lead  . EKG 12 lead     Hospital Course: Patient was admitted to [facility] and taken to the OR and underwent the above state procedure without complications.  Patient tolerated the procedure well and was later transferred to the recovery room and then to the orthopaedic floor for postoperative care.  They were given PO and IV analgesics for pain control following their surgery.  They were given 24 hours of postoperative antibiotics of  Anti-infectives (From admission, onward)   Start     Dose/Rate Route Frequency Ordered Stop   02/22/18 1300  ceFAZolin (ANCEF) IVPB 2g/100 mL premix     2 g 200 mL/hr over 30 Minutes Intravenous Every 6 hours 02/22/18 1201 02/23/18 0028   02/22/18 0533  ceFAZolin (ANCEF) IVPB 2g/100 mL premix     2 g 200 mL/hr over 30 Minutes Intravenous On call to O.R. 02/22/18 6770 02/22/18 0709     and started on DVT prophylaxis in the form  of Xarelto.   PT and OT were ordered for total hip protocol.  The patient was allowed to be WBAT with therapy. Discharge planning was consulted to help with postop disposition and equipment needs.  Patient had a decent night on the evening of surgery and walked 100 feet.  They started to get up OOB with therapy on day one again.  Hemovac drain was pulled without difficulty.  Dressing was checked and was okay. Patient was seen in rounds and was ready to go home.   Discharge home with home health if meets goals Diet - Cardiac diet Follow up - in 2 weeks Activity - WBAT Disposition - Home Condition Upon Discharge - improving D/C Meds - See DC Summary DVT Prophylaxis - Xarelto    Discharge Instructions    Call MD / Call 911   Complete by:  As directed    If you experience chest pain or shortness of breath, CALL 911 and be transported to the hospital emergency room.  If you develope a fever above 101 F, pus (white drainage) or increased drainage or redness at the wound, or calf pain, call your surgeon's office.   Change dressing   Complete by:  As directed    You may change your dressing dressing daily with sterile 4 x 4 inch gauze dressing and paper tape.  Do not submerge the incision under water.   Constipation Prevention   Complete by:  As directed    Drink plenty of fluids.  Prune juice may be helpful.  You may use a stool softener, such as Colace (over the counter) 100 mg twice a day.  Use MiraLax (over the counter) for constipation as needed.   Diet - low sodium heart healthy   Complete by:  As directed  Discharge instructions   Complete by:  As directed    Take Xarelto for two and a half more weeks, then discontinue Xarelto. Once the patient has completed the Xarelto, they may resume the 81 mg Aspirin.   Pick up stool softner and laxative for home use following surgery while on pain medications. Do not submerge incision under water. Please use good hand washing techniques while  changing dressing each day. May shower starting three days after surgery. Please use a clean towel to pat the incision dry following showers. Continue to use ice for pain and swelling after surgery. Do not use any lotions or creams on the incision until instructed by your surgeon.  Wear both TED hose on both legs during the day every day for three weeks, but may remove the TED hose at night at home.  Postoperative Constipation Protocol  Constipation - defined medically as fewer than three stools per week and severe constipation as less than one stool per week.  One of the most common issues patients have following surgery is constipation.  Even if you have a regular bowel pattern at home, your normal regimen is likely to be disrupted due to multiple reasons following surgery.  Combination of anesthesia, postoperative narcotics, change in appetite and fluid intake all can affect your bowels.  In order to avoid complications following surgery, here are some recommendations in order to help you during your recovery period.  Colace (docusate) - Pick up an over-the-counter form of Colace or another stool softener and take twice a day as long as you are requiring postoperative pain medications.  Take with a full glass of water daily.  If you experience loose stools or diarrhea, hold the colace until you stool forms back up.  If your symptoms do not get better within 1 week or if they get worse, check with your doctor.  Dulcolax (bisacodyl) - Pick up over-the-counter and take as directed by the product packaging as needed to assist with the movement of your bowels.  Take with a full glass of water.  Use this product as needed if not relieved by Colace only.   MiraLax (polyethylene glycol) - Pick up over-the-counter to have on hand.  MiraLax is a solution that will increase the amount of water in your bowels to assist with bowel movements.  Take as directed and can mix with a glass of water, juice, soda,  coffee, or tea.  Take if you go more than two days without a movement. Do not use MiraLax more than once per day. Call your doctor if you are still constipated or irregular after using this medication for 7 days in a row.  If you continue to have problems with postoperative constipation, please contact the office for further assistance and recommendations.  If you experience "the worst abdominal pain ever" or develop nausea or vomiting, please contact the office immediatly for further recommendations for treatment.   Do not sit on low chairs, stoools or toilet seats, as it may be difficult to get up from low surfaces   Complete by:  As directed    Driving restrictions   Complete by:  As directed    No driving until released by the physician.   Increase activity slowly as tolerated   Complete by:  As directed    Lifting restrictions   Complete by:  As directed    No lifting until released by the physician.   Patient may shower   Complete by:  As directed  You may shower without a dressing once there is no drainage.  Do not wash over the wound.  If drainage remains, do not shower until drainage stops.   TED hose   Complete by:  As directed    Use stockings (TED hose) for 3 weeks on both leg(s).  You may remove them at night for sleeping.   Weight bearing as tolerated   Complete by:  As directed    Laterality:  right   Extremity:  Lower     Allergies as of 02/23/2018      Reactions   Metoprolol    Sluggish, Tired   Wellbutrin [bupropion]    Fatigue, increased BP       Medication List    STOP taking these medications   aspirin 81 MG tablet   CENTRUM SILVER 50+MEN PO   omega-3 acid ethyl esters 1 g capsule Commonly known as:  LOVAZA   sildenafil 20 MG tablet Commonly known as:  REVATIO   TURMERIC PO     TAKE these medications   allopurinol 300 MG tablet Commonly known as:  ZYLOPRIM Take 300 mg by mouth daily.   amLODipine 5 MG tablet Commonly known as:   NORVASC Take 5 mg by mouth daily.   famotidine 10 MG chewable tablet Commonly known as:  PEPCID AC Chew 10 mg by mouth daily as needed for heartburn.   irbesartan 300 MG tablet Commonly known as:  AVAPRO Take 300 mg by mouth daily.   Melatonin 3 MG Tabs Take 3 mg by mouth at bedtime as needed (sleep).   methocarbamol 500 MG tablet Commonly known as:  ROBAXIN Take 1 tablet (500 mg total) by mouth every 6 (six) hours as needed for muscle spasms.   mometasone 50 MCG/ACT nasal spray Commonly known as:  NASONEX Place 2 sprays into the nose daily. What changed:    when to take this  reasons to take this   oxyCODONE 5 MG immediate release tablet Commonly known as:  Oxy IR/ROXICODONE Take 1-2 tablets (5-10 mg total) by mouth every 4 (four) hours as needed for moderate pain or severe pain.   pravastatin 20 MG tablet Commonly known as:  PRAVACHOL Take 20 mg by mouth daily.   rivaroxaban 10 MG Tabs tablet Commonly known as:  XARELTO Take 1 tablet (10 mg total) by mouth daily with breakfast. Take Xarelto for two and a half more weeks following discharge from the hospital, then discontinue Xarelto. Once the patient has completed the Xarelto, they may resume the 81 mg Aspirin.   tamsulosin 0.4 MG Caps capsule Commonly known as:  FLOMAX Take 0.4 mg by mouth daily.   traMADol 50 MG tablet Commonly known as:  ULTRAM Take 1-2 tablets (50-100 mg total) by mouth every 6 (six) hours as needed (mild pain).            Discharge Care Instructions  (From admission, onward)        Start     Ordered   02/23/18 0000  Weight bearing as tolerated    Question Answer Comment  Laterality right   Extremity Lower      02/23/18 0808   02/23/18 0000  Change dressing    Comments:  You may change your dressing dressing daily with sterile 4 x 4 inch gauze dressing and paper tape.  Do not submerge the incision under water.   02/23/18 4801     Follow-up Information    Gaynelle Arabian, MD.  Schedule an appointment  as soon as possible for a visit in 2 week(s).   Specialty:  Orthopedic Surgery Contact information: [address] [place] [address] [phone]           Signed: Arlee Muslim, PA-C Orthopaedic Surgery 02/23/2018, 8:09 AM

## 2018-02-23 NOTE — Progress Notes (Signed)
Physical Therapy Treatment Patient Details Name: John Potts MRN: [id number] DOB: Aug 19, 1946 Today's Date: 02/23/2018    History of Present Illness Pt s/p R THR    PT Comments    Pt progressing well with mobility and eager for dc home this pm.  Reviewed shower transfers and car transfers.   Follow Up Recommendations  Follow surgeon's recommendation for DC plan and follow-up therapies     Equipment Recommendations  None recommended by PT    Recommendations for Other Services OT consult     Precautions / Restrictions Precautions Precautions: Fall Restrictions Weight Bearing Restrictions: No Other Position/Activity Restrictions: WBAT    Mobility  Bed Mobility               General bed mobility comments: Pt up in chair and requests back to same  Transfers Overall transfer level: Needs assistance Equipment used: Rolling walker (2 wheeled) Transfers: Sit to/from Stand Sit to Stand: Min guard         General transfer comment: cues for LE management and use of UEs to self assist  Ambulation/Gait Ambulation/Gait assistance: Min guard Ambulation Distance (Feet): 200 Feet Assistive device: Rolling walker (2 wheeled) Gait Pattern/deviations: Step-to pattern;Step-through pattern;Decreased step length - right;Decreased step length - left;Shuffle;Trunk flexed Gait velocity: decr Gait velocity interpretation: Below normal speed for age/gender General Gait Details: cues for posture, position from RW and sequence   Stairs            Wheelchair Mobility    Modified Rankin (Stroke Patients Only)       Balance Overall balance assessment: No apparent balance deficits (not formally assessed)                                          Cognition Arousal/Alertness: Awake/alert Behavior During Therapy: WFL for tasks assessed/performed Overall Cognitive Status: Within Functional Limits for tasks assessed                                         Exercises Total Joint Exercises Ankle Circles/Pumps: AROM;Both;15 reps;Supine Quad Sets: AROM;Both;10 reps;Supine Heel Slides: AAROM;Right;20 reps;Supine Hip ABduction/ADduction: AAROM;Right;15 reps;Supine Long Arc Quad: AROM;Right;10 reps;Supine    General Comments        Pertinent Vitals/Pain Pain Assessment: 0-10 Pain Score: 3  Pain Location: R hip Pain Descriptors / Indicators: Aching;Sore Pain Intervention(s): Limited activity within patient's tolerance;Monitored during session;Premedicated before session    Home Living                      Prior Function            PT Goals (current goals can now be found in the care plan section) Acute Rehab PT Goals Patient Stated Goal: Regain IND and walk with less pain PT Goal Formulation: With patient Time For Goal Achievement: 03/01/18 Potential to Achieve Goals: Good Progress towards PT goals: Progressing toward goals    Frequency    7X/week      PT Plan Current plan remains appropriate    Co-evaluation              AM-PAC PT "6 Clicks" Daily Activity  Outcome Measure  Difficulty turning over in bed (including adjusting bedclothes, sheets and blankets)?: Unable Difficulty moving from lying on back to sitting  on the side of the bed? : Unable Difficulty sitting down on and standing up from a chair with arms (e.g., wheelchair, bedside commode, etc,.)?: Unable Help needed moving to and from a bed to chair (including a wheelchair)?: A Little Help needed walking in hospital room?: A Little Help needed climbing 3-5 steps with a railing? : A Little 6 Click Score: 12    End of Session Equipment Utilized During Treatment: Gait belt Activity Tolerance: Patient tolerated treatment well Patient left: in chair;with call bell/phone within reach Nurse Communication: Mobility status PT Visit Diagnosis: Difficulty in walking, not elsewhere classified (R26.2)     Time: 3235-5732 PT Time  Calculation (min) (ACUTE ONLY): 24 min  Charges:  $Gait Training: 8-22 mins $Therapeutic Exercise: 8-22 mins                    G Codes:       Pg [phone]    Tamsen Reist 02/23/2018, 12:22 PM

## 2018-02-23 NOTE — Progress Notes (Signed)
RN reviewed discharge instructions with patient and family. All questions answered.   Paperwork and prescriptions given. RN demonstrated dressing change to patient, patient communicated understanding.  NT rolled patient down with all belongings to family car.

## 2018-02-23 NOTE — Progress Notes (Signed)
Discharge planning, spoke with patient at bedside. Have chosen [facility] for [facility] PT, evaluate and treat. Contacted [facility] for referral. Needs 3n1, contacted [facility] to deliver to room. Has a RW. [phone]

## 2018-02-23 NOTE — Progress Notes (Signed)
Physical Therapy Treatment Patient Details Name: John Potts MRN: [id number] DOB: March 04, 1946 Today's Date: 02/23/2018    History of Present Illness Pt s/p R THR    PT Comments    Pt progressing well and eager for dc home this date.  Reviewed stairs and home therex program with written instruction provided.   Follow Up Recommendations  Follow surgeon's recommendation for DC plan and follow-up therapies     Equipment Recommendations  None recommended by PT    Recommendations for Other Services OT consult     Precautions / Restrictions Precautions Precautions: Fall Restrictions Weight Bearing Restrictions: No Other Position/Activity Restrictions: WBAT    Mobility  Bed Mobility Overal bed mobility: Needs Assistance Bed Mobility: Supine to Sit;Sit to Supine     Supine to sit: Min guard;Supervision Sit to supine: Min guard;Supervision   General bed mobility comments: min cues for sequence and use of L LE to self assist  Transfers Overall transfer level: Needs assistance Equipment used: Rolling walker (2 wheeled) Transfers: Sit to/from Stand Sit to Stand: Supervision         General transfer comment: cues for LE management and use of UEs to self assist  Ambulation/Gait Ambulation/Gait assistance: Min guard;Supervision Ambulation Distance (Feet): 340 Feet Assistive device: Rolling walker (2 wheeled) Gait Pattern/deviations: Step-to pattern;Step-through pattern;Decreased step length - right;Decreased step length - left;Shuffle;Trunk flexed Gait velocity: decr Gait velocity interpretation: Below normal speed for age/gender General Gait Details: min cues for posture, position from RW and sequence   Stairs Stairs: Yes   Stair Management: One rail Left;Step to pattern;Forwards;With cane Number of Stairs: 5 General stair comments: cues for sequence and foot/cane placement  Wheelchair Mobility    Modified Rankin (Stroke Patients Only)       Balance  Overall balance assessment: No apparent balance deficits (not formally assessed)                                          Cognition Arousal/Alertness: Awake/alert Behavior During Therapy: WFL for tasks assessed/performed Overall Cognitive Status: Within Functional Limits for tasks assessed                                        Exercises Total Joint Exercises Ankle Circles/Pumps: AROM;Both;15 reps;Supine Quad Sets: AROM;Both;10 reps;Supine Heel Slides: AAROM;Right;20 reps;Supine Hip ABduction/ADduction: AAROM;Right;15 reps;Supine Long Arc Quad: AROM;Right;10 reps;Supine    General Comments        Pertinent Vitals/Pain Pain Assessment: 0-10 Pain Score: 3  Pain Location: R hip Pain Descriptors / Indicators: Aching;Sore Pain Intervention(s): Limited activity within patient's tolerance;Monitored during session;Premedicated before session    Home Living                      Prior Function            PT Goals (current goals can now be found in the care plan section) Acute Rehab PT Goals Patient Stated Goal: Regain IND and walk with less pain PT Goal Formulation: With patient Time For Goal Achievement: 03/01/18 Potential to Achieve Goals: Good Progress towards PT goals: Progressing toward goals    Frequency    7X/week      PT Plan Current plan remains appropriate    Co-evaluation  AM-PAC PT "6 Clicks" Daily Activity  Outcome Measure  Difficulty turning over in bed (including adjusting bedclothes, sheets and blankets)?: A Little Difficulty moving from lying on back to sitting on the side of the bed? : A Little Difficulty sitting down on and standing up from a chair with arms (e.g., wheelchair, bedside commode, etc,.)?: A Little Help needed moving to and from a bed to chair (including a wheelchair)?: A Little Help needed walking in hospital room?: A Little Help needed climbing 3-5 steps with a railing?  : A Little 6 Click Score: 18    End of Session Equipment Utilized During Treatment: Gait belt Activity Tolerance: Patient tolerated treatment well Patient left: with call bell/phone within reach;in bed Nurse Communication: Mobility status PT Visit Diagnosis: Difficulty in walking, not elsewhere classified (R26.2)     Time: 1125-1150 PT Time Calculation (min) (ACUTE ONLY): 25 min  Charges:  $Gait Training: 8-22 mins $Therapeutic Exercise: 8-22 mins                    G Codes:       Pg [phone]    Rodric Punch 02/23/2018, 12:27 PM

## 2018-02-23 NOTE — Progress Notes (Signed)
   Subjective: 1 Day Post-Op Procedure(s) (LRB): RIGHT TOTAL HIP ARTHROPLASTY ANTERIOR APPROACH (Right) Patient reports pain as mild.   Patient seen in rounds by Dr. Wynelle Link. Patient is well, but has had some minor complaints of pain in the hip, requiring pain medications We will resume therapy today. He walked over 100 feet yesterday.  If they do well with therapy and meets all goals, then will allow home later this afternoon following therapy. Plan is to go Home after hospital stay.  Objective: Vital signs in last 24 hours: Temp:  [97.3 F (36.3 C)-99.1 F (37.3 C)] 98.9 F (37.2 C) (02/28 0528) Pulse Rate:  [81-105] 105 (02/28 0528) Resp:  [9-23] 16 (02/28 0528) BP: (110-162)/(52-88) 151/72 (02/28 0528) SpO2:  [95 %-100 %] 97 % (02/28 0528) Weight:  [69.4 kg (153 lb)] 69.4 kg (153 lb) (02/27 1156)  Intake/Output from previous day:  Intake/Output Summary (Last 24 hours) at 02/23/2018 0800 Last data filed at 02/23/2018 0600 Gross per 24 hour  Intake 4300 ml  Output 4240 ml  Net 60 ml    Intake/Output this shift: No intake/output data recorded.  Labs: Recent Labs    02/23/18 0501  HGB 11.6*   Recent Labs    02/23/18 0501  WBC 17.3*  RBC 3.80*  HCT 34.2*  PLT 224   Recent Labs    02/23/18 0501  NA 139  K 4.1  CL 106  CO2 26  BUN 13  CREATININE 0.75  GLUCOSE 142*  CALCIUM 8.8*   No results for input(s): LABPT, INR in the last 72 hours.  EXAM General - Patient is Alert, Appropriate and Oriented Extremity - Neurovascular intact Sensation intact distally Intact pulses distally Dorsiflexion/Plantar flexion intact Dressing - dressing C/D/I Motor Function - intact, moving foot and toes well on exam.  Hemovac pulled without difficulty.  Past Medical History:  Diagnosis Date  . Complication of anesthesia    Difficult urinating  . Erectile dysfunction   . Gout   . Hyperlipidemia   . Hypertension   . Kidney stone   . Knee pain   . OSA on CPAP      Assessment/Plan: 1 Day Post-Op Procedure(s) (LRB): RIGHT TOTAL HIP ARTHROPLASTY ANTERIOR APPROACH (Right) Principal Problem:   OA (osteoarthritis) of hip  Estimated body mass index is 23.61 kg/m as calculated from the following:   Height as of this encounter: 5' 7.5" (1.715 m).   Weight as of this encounter: 69.4 kg (153 lb). Advance diet Up with therapy Discharge home with home health PT  DVT Prophylaxis - Xarelto Weight Bearing As Tolerated right Leg Hemovac Pulled Begin Therapy  If meets goals and able to go home: Discharge home with home health if meets goals Diet - Cardiac diet Follow up - in 2 weeks Activity - WBAT Disposition - Home Condition Upon Discharge - pending therapy goals D/C Meds - See DC Summary DVT Prophylaxis - [place], PA-C Orthopaedic Surgery 02/23/2018, 8:01 AM

## 2018-09-05 ENCOUNTER — Telehealth: Payer: Self-pay | Admitting: Gastroenterology

## 2018-09-05 NOTE — Telephone Encounter (Signed)
Hi Dr. Fuller Plan, we have received previous Gi records from pt who needs a repeat colonoscopy. Pt was a former Dr. Buel Ream pt and transferred his care to Dr. Earlean Shawl. He wants to transfer back because he wants to see a doctor in [place] and Dr. Earlean Shawl changed location to [facility]. Records will be placed on your desk for review. Thank you.

## 2018-09-06 ENCOUNTER — Encounter: Payer: Self-pay | Admitting: Gastroenterology

## 2018-09-26 ENCOUNTER — Telehealth: Payer: Self-pay

## 2018-09-26 NOTE — Telephone Encounter (Signed)
Spoke with pt regarding being on Xarelto. The pt states he does NOT take Xarelto, his wife does! I informed the pt it was a blood thinner and we would need to make an OV.  Will proceed with PV on 10/03/18 as scheduled. Gwyndolyn Saxon in [facility]

## 2018-10-03 ENCOUNTER — Encounter: Payer: Self-pay | Admitting: Gastroenterology

## 2018-10-03 ENCOUNTER — Telehealth: Payer: Self-pay | Admitting: Adult Health

## 2018-10-03 ENCOUNTER — Other Ambulatory Visit: Payer: Self-pay

## 2018-10-03 ENCOUNTER — Ambulatory Visit (AMBULATORY_SURGERY_CENTER): Payer: Self-pay | Admitting: *Deleted

## 2018-10-03 VITALS — Ht 68.0 in | Wt 154.8 lb

## 2018-10-03 DIAGNOSIS — Z86010 Personal history of colonic polyps: Secondary | ICD-10-CM

## 2018-10-03 MED ORDER — SUPREP BOWEL PREP KIT 17.5-3.13-1.6 GM/177ML PO SOLN
1.0000 | Freq: Once | ORAL | 0 refills | Status: AC
Start: 2018-10-03 — End: 2018-10-03

## 2018-10-03 NOTE — Telephone Encounter (Signed)
Spoke with patient to advise him he can be seen on a Thurs by NP, in Nov. He agreed; follow up changed to Nov 7 with NP. He will remain on wait list. He verbalized understanding, appreciation.

## 2018-10-03 NOTE — Progress Notes (Signed)
No egg or soy allergy known to patient  No issues with past sedation with any surgeries  or procedures, no intubation problems  No diet pills per patient No home 02 use per patient  No blood thinners per patient  Pt denies issues with constipation  No A fib or A flutter  EMMI video offered and declined by the patient. Coupon 50.00 given.

## 2018-10-03 NOTE — Telephone Encounter (Signed)
Pt received a call that his yearly f/u would need to be rescheduled.  Pt unable to wait until 05-2019 to see NP Megan, he accepted Dr Dohmeier's 1st available and is on wait list.  Pt states he is very much in need of his CPAP supplies and is asking for a call for a way to be able to get supplies soon.

## 2018-10-03 NOTE — Telephone Encounter (Signed)
Can use research slots on Thursday in November.

## 2018-10-09 ENCOUNTER — Ambulatory Visit: Payer: Medicare Other | Admitting: Adult Health

## 2018-10-19 ENCOUNTER — Ambulatory Visit (AMBULATORY_SURGERY_CENTER): Payer: Medicare Other | Admitting: Gastroenterology

## 2018-10-19 ENCOUNTER — Encounter: Payer: Self-pay | Admitting: Gastroenterology

## 2018-10-19 VITALS — BP 99/54 | HR 78 | Temp 98.6°F | Resp 18 | Ht 68.0 in | Wt 154.0 lb

## 2018-10-19 DIAGNOSIS — D129 Benign neoplasm of anus and anal canal: Secondary | ICD-10-CM

## 2018-10-19 DIAGNOSIS — K621 Rectal polyp: Secondary | ICD-10-CM | POA: Diagnosis not present

## 2018-10-19 DIAGNOSIS — Z86010 Personal history of colonic polyps: Secondary | ICD-10-CM | POA: Diagnosis present

## 2018-10-19 DIAGNOSIS — D120 Benign neoplasm of cecum: Secondary | ICD-10-CM

## 2018-10-19 DIAGNOSIS — D123 Benign neoplasm of transverse colon: Secondary | ICD-10-CM | POA: Diagnosis not present

## 2018-10-19 DIAGNOSIS — D124 Benign neoplasm of descending colon: Secondary | ICD-10-CM

## 2018-10-19 DIAGNOSIS — D128 Benign neoplasm of rectum: Secondary | ICD-10-CM

## 2018-10-19 MED ORDER — SODIUM CHLORIDE 0.9 % IV SOLN: 500.0000 mL | Freq: Once | INTRAVENOUS | Status: DC

## 2018-10-19 NOTE — Progress Notes (Signed)
Report given to PACU, vss 

## 2018-10-19 NOTE — Progress Notes (Signed)
Called to room to assist during endoscopic procedure.  Patient ID and intended procedure confirmed with present staff. Received instructions for my participation in the procedure from the performing physician.  

## 2018-10-19 NOTE — Op Note (Signed)
[place] Patient Name: John Potts Procedure Date: 10/19/2018 1:26 PM MRN: [id number] Endoscopist: Ladene Artist , MD Age: 72 Referring MD:  Date of Birth: 18-Aug-1946 Gender: Male Account #: [id number] Procedure:                Colonoscopy Indications:              Surveillance: Personal history of adenomatous                            polyps on last colonoscopy > 5 years ago Medicines:                Monitored Anesthesia Care Procedure:                Pre-Anesthesia Assessment:                           - Prior to the procedure, a History and Physical                            was performed, and patient medications and                            allergies were reviewed. The patient's tolerance of                            previous anesthesia was also reviewed. The risks                            and benefits of the procedure and the sedation                            options and risks were discussed with the patient.                            All questions were answered, and informed consent                            was obtained. Prior Anticoagulants: The patient has                            taken no previous anticoagulant or antiplatelet                            agents. ASA Grade Assessment: II - A patient with                            mild systemic disease. After reviewing the risks                            and benefits, the patient was deemed in                            satisfactory condition to undergo the procedure.  After obtaining informed consent, the colonoscope                            was passed under direct vision. Throughout the                            procedure, the patient's blood pressure, pulse, and                            oxygen saturations were monitored continuously. The                            Colonoscope was introduced through the anus and                            advanced to the the cecum,  identified by                            appendiceal orifice and ileocecal valve. The                            ileocecal valve, appendiceal orifice, and rectum                            were photographed. The quality of the bowel                            preparation was good. The colonoscopy was performed                            without difficulty. The patient tolerated the                            procedure well. Scope In: 1:42:01 PM Scope Out: 1:56:38 PM Scope Withdrawal Time: 0 hours 12 minutes 20 seconds  Total Procedure Duration: 0 hours 14 minutes 37 seconds  Findings:                 Hemorrhoids were found on perianal exam.                           Two sessile polyps were found in the rectum. The                            polyps were 6 to 7 mm in size. These polyps were                            removed with a cold snare. Resection and retrieval                            were complete.                           Three sessile polyps were found in the descending  colon, transverse colon and cecum. The polyps were                            4 mm in size. These polyps were removed with a cold                            biopsy forceps. Resection and retrieval were                            complete.                           Internal hemorrhoids were found during                            retroflexion. The hemorrhoids were small and Grade                            I (internal hemorrhoids that do not prolapse).                           The exam was otherwise without abnormality on                            direct and retroflexion views. Complications:            No immediate complications. Estimated blood loss:                            None. Estimated Blood Loss:     Estimated blood loss: none. Impression:               - Hemorrhoids found on perianal exam.                           - Two 6 to 7 mm polyps in the rectum, removed with                             a cold snare. Resected and retrieved.                           - Two 4 mm polyps in the descending colon,                            transverse colon and in the cecum, removed with a                            cold biopsy forceps. Resected and retrieved.                           - Internal hemorrhoids.                           - The examination was otherwise normal on direct  and retroflexion views. Recommendation:           - Repeat colonoscopy in 5 years for surveillance.                           - Patient has a contact number available for                            emergencies. The signs and symptoms of potential                            delayed complications were discussed with the                            patient. Return to normal activities tomorrow.                            Written discharge instructions were provided to the                            patient.                           - Resume previous diet.                           - Continue present medications.                           - Await pathology results. Ladene Artist, MD 10/19/2018 2:04:52 PM This report has been signed electronically.

## 2018-10-19 NOTE — Patient Instructions (Signed)
YOU HAD AN ENDOSCOPIC PROCEDURE TODAY AT [facility]:   Refer to the procedure report that was given to you for any specific questions about what was found during the examination.  If the procedure report does not answer your questions, please call your gastroenterologist to clarify.  If you requested that your care partner not be given the details of your procedure findings, then the procedure report has been included in a sealed envelope for you to review at your convenience later.  YOU SHOULD EXPECT: Some feelings of bloating in the abdomen. Passage of more gas than usual.  Walking can help get rid of the air that was put into your GI tract during the procedure and reduce the bloating. If you had a lower endoscopy (such as a colonoscopy or flexible sigmoidoscopy) you may notice spotting of blood in your stool or on the toilet paper. If you underwent a bowel prep for your procedure, you may not have a normal bowel movement for a few days.  Please Note:  You might notice some irritation and congestion in your nose or some drainage.  This is from the oxygen used during your procedure.  There is no need for concern and it should clear up in a day or so.  SYMPTOMS TO REPORT IMMEDIATELY:   Following lower endoscopy (colonoscopy or flexible sigmoidoscopy):  Try preparation H for your hemorrhoids.  Excessive amounts of blood in the stool  Significant tenderness or worsening of abdominal pains  Swelling of the abdomen that is new, acute  Fever of 100F or higher   For urgent or emergent issues, a gastroenterologist can be reached at any hour by calling [phone].   DIET:  We do recommend a small meal at first, but then you may proceed to your regular diet.  Drink plenty of fluids but you should avoid alcoholic beverages for 24 hours.  ACTIVITY:  You should plan to take it easy for the rest of today and you should NOT DRIVE or use heavy machinery until tomorrow (because of the  sedation medicines used during the test).    FOLLOW UP: Our staff will call the number listed on your records the next business day following your procedure to check on you and address any questions or concerns that you may have regarding the information given to you following your procedure. If we do not reach you, we will leave a message.  However, if you are feeling well and you are not experiencing any problems, there is no need to return our call.  We will assume that you have returned to your regular daily activities without incident.  If any biopsies were taken you will be contacted by phone or by letter within the next 1-3 weeks.  Please call us at [phone] if you have not heard about the biopsies in 3 weeks.    SIGNATURES/CONFIDENTIALITY: You and/or your care partner have signed paperwork which will be entered into your electronic medical record.  These signatures attest to the fact that that the information above on your After Visit Summary has been reviewed and is understood.  Full responsibility of the confidentiality of this discharge information lies with you and/or your care-partner.  Read all of the handouts given to you by your recovery room nurse.

## 2018-10-20 ENCOUNTER — Telehealth: Payer: Self-pay

## 2018-10-20 NOTE — Telephone Encounter (Signed)
  Follow up Call-  Call back number 10/19/2018  Post procedure Call Back phone  # [phone]  Permission to leave phone message Yes  Some recent data might be hidden     Patient questions:  Do you have a fever, pain , or abdominal swelling? No. Pain Score  0 *  Have you tolerated food without any problems? Yes.    Have you been able to return to your normal activities? Yes.    Do you have any questions about your discharge instructions: Diet   No. Medications  No. Follow up visit  No.  Do you have questions or concerns about your Care? No.  Actions: * If pain score is 4 or above: No action needed, pain <4.

## 2018-10-31 ENCOUNTER — Encounter: Payer: Self-pay | Admitting: Adult Health

## 2018-11-02 ENCOUNTER — Ambulatory Visit: Payer: Medicare Other | Admitting: Adult Health

## 2018-11-02 ENCOUNTER — Encounter

## 2018-11-02 ENCOUNTER — Encounter: Payer: Self-pay | Admitting: Adult Health

## 2018-11-02 VITALS — BP 118/66 | HR 79 | Ht 68.0 in | Wt 157.2 lb

## 2018-11-02 DIAGNOSIS — Z9989 Dependence on other enabling machines and devices: Secondary | ICD-10-CM | POA: Diagnosis not present

## 2018-11-02 DIAGNOSIS — G4733 Obstructive sleep apnea (adult) (pediatric): Secondary | ICD-10-CM | POA: Diagnosis not present

## 2018-11-02 NOTE — Progress Notes (Signed)
PATIENT: John Potts DOB: March 18, 1946  REASON FOR VISIT: follow up HISTORY FROM: patient  HISTORY OF PRESENT ILLNESS: Today 11/02/18:  John Potts is a 72 year old male with a history of obstructive sleep.  He returns today for follow-up.  His CPAP download indicates that he uses machine nightly for compliance of 100%.  He uses machine greater than 4 hours each night.  On average he uses his machine 8 hours and 39 minutes.  His residual AHI is 4.4 on 10 to 15 cm of water with EPR of 2.  He does not have a leak.  The patient states he is unsure if he likes the machine starting at a lower pressure.  Otherwise he feels that everything is working well.  His Epworth sleepiness score 4 and fatigue severity score is 13.  He returns today for evaluation.. .  HISTORY 10/06/17 John Potts is a 73 year old male with a history of obstructive sleep apnea on CPAP. He returns today for a compliance download. His download indicates that he uses machine 30 out of 30 days for compliance of 100%. On average he uses his machine 8 hours and 47 minutes. His residual AHI is 3.6 on 11 cm water with EPR 2. He does not have a significant leak. He reports overall he is tolerating the machine. He does state that there are some nights he feels that he could use more pressure. He states that his wife uses [employer] and he feels that that may be beneficial for him. He returns today for an evaluation.  REVIEW OF SYSTEMS: Out of a complete 14 system review of symptoms, the patient complains only of the following symptoms, and all other reviewed systems are negative.  ALLERGIES: Allergies  Allergen Reactions  . Metoprolol     Sluggish, Tired  . Wellbutrin [Bupropion]     Fatigue, increased BP     HOME MEDICATIONS: Outpatient Medications Prior to Visit  Medication Sig Dispense Refill  . allopurinol (ZYLOPRIM) 300 MG tablet Take 300 mg by mouth daily.    Marland Kitchen amLODipine (NORVASC) 5 MG tablet Take 5 mg by mouth daily.    Marland Kitchen  aspirin 81 MG chewable tablet aspirin 81 mg chewable tablet  Chew 1 tablet every day by oral route.    . famotidine (PEPCID AC) 10 MG chewable tablet Chew 10 mg by mouth daily as needed for heartburn.    . irbesartan (AVAPRO) 300 MG tablet Take 300 mg by mouth daily.    . Melatonin 3 MG TABS Take 3 mg by mouth at bedtime as needed (sleep).    . mometasone (NASONEX) 50 MCG/ACT nasal spray Place 2 sprays into the nose daily. (Patient taking differently: Place 2 sprays into the nose daily as needed (allergies). ) 17 g 11  . Multiple Vitamins-Minerals (MULTIVITAMIN ADULT PO) multivitamin    . Omega-3 Fatty Acids (FISH OIL PO) Take by mouth.    . pravastatin (PRAVACHOL) 20 MG tablet Take 20 mg by mouth daily.    . tadalafil (CIALIS) 5 MG tablet Take by mouth.    . Tamsulosin HCl (FLOMAX) 0.4 MG CAPS Take 0.4 mg by mouth daily.     . methocarbamol (ROBAXIN) 500 MG tablet Take 1 tablet (500 mg total) by mouth every 6 (six) hours as needed for muscle spasms. (Patient not taking: Reported on 10/03/2018) 60 tablet 0   Facility-Administered Medications Prior to Visit  Medication Dose Route Frequency Provider Last Rate Last Dose  . 0.9 %  sodium chloride  infusion  500 mL Intravenous Once Ladene Artist, MD        PAST MEDICAL HISTORY: Past Medical History:  Diagnosis Date  . Allergy   . Arthritis   . Complication of anesthesia    Difficult urinating  . Erectile dysfunction   . Gout   . Hyperlipidemia   . Hypertension   . Kidney stone   . Knee pain   . OSA on CPAP   . Sleep apnea    uses cpap    PAST SURGICAL HISTORY: Past Surgical History:  Procedure Laterality Date  . CATARACT EXTRACTION, BILATERAL    . LIPOMA EXCISION    . MOHS SURGERY    . TONSILLECTOMY    . TOTAL HIP ARTHROPLASTY Right 02/22/2018   Procedure: RIGHT TOTAL HIP ARTHROPLASTY ANTERIOR APPROACH;  Surgeon: Gaynelle Arabian, MD;  Location: [facility];  Service: Orthopedics;  Laterality: Right;    FAMILY HISTORY: Family  History  Problem Relation Age of Onset  . Hypertension Mother   . Uterine cancer Mother   . Hypertension Father   . Stomach cancer Neg Hx   . Rectal cancer Neg Hx   . Esophageal cancer Neg Hx   . Colon polyps Neg Hx   . Colon cancer Neg Hx     SOCIAL HISTORY: Social History   Socioeconomic History  . Marital status: Married    Spouse name: Not on file  . Number of children: Not on file  . Years of education: Not on file  . Highest education level: Not on file  Occupational History  . Occupation: retired  Scientific laboratory technician  . Financial resource strain: Not on file  . Food insecurity:    Worry: Not on file    Inability: Not on file  . Transportation needs:    Medical: Not on file    Non-medical: Not on file  Tobacco Use  . Smoking status: Former Smoker    Packs/day: 0.50    Years: 50.00    Pack years: 25.00    Types: Cigars, Cigarettes    Last attempt to quit: 04/13/2017    Years since quitting: 1.5  . Smokeless tobacco: Never Used  . Tobacco comment: Pt smokes intermittently  Substance and Sexual Activity  . Alcohol use: Yes    Alcohol/week: 7.0 standard drinks    Types: 7 Standard drinks or equivalent per week    Comment: [employer]-Daily  . Drug use: No  . Sexual activity: Not on file  Lifestyle  . Physical activity:    Days per week: Not on file    Minutes per session: Not on file  . Stress: Not on file  Relationships  . Social connections:    Talks on phone: Not on file    Gets together: Not on file    Attends religious service: Not on file    Active member of club or organization: Not on file    Attends meetings of clubs or organizations: Not on file    Relationship status: Not on file  . Intimate partner violence:    Fear of current or ex partner: Not on file    Emotionally abused: Not on file    Physically abused: Not on file    Forced sexual activity: Not on file  Other Topics Concern  . Not on file  Social History Narrative   Drinks 2 cups of coffee  in the morning.      PHYSICAL EXAM  Vitals:   11/02/18 1027  BP:  118/66  Pulse: 79  Weight: 157 lb 3.2 oz (71.3 kg)  Height: 5\' 8"  (1.727 m)   Body mass index is 23.9 kg/m.  Generalized: Well developed, in no acute distress   Neurological examination  Mentation: Alert oriented to time, place, history taking. Follows all commands speech and language fluent Cranial nerve II-XII: . Extraocular movements were full, visual field were full on confrontational test. Facial sensation and strength were normal. Uvula tongue midline. Head turning and shoulder shrug  were normal and symmetric. Motor: The motor testing reveals 5 over 5 strength of all 4 extremities. Good symmetric motor tone is noted throughout.  Neck circumference 16 inches, Mallampati 3+ Sensory: Sensory testing is intact to soft touch on all 4 extremities. No evidence of extinction is noted.  Coordination: Cerebellar testing reveals good finger-nose-finger and heel-to-shin bilaterally.  Gait and station: Gait is normal.   DIAGNOSTIC DATA (LABS, IMAGING, TESTING) - I reviewed patient records, labs, notes, testing and imaging myself where available.  Lab Results  Component Value Date   WBC 17.3 (H) 02/23/2018   HGB 11.6 (L) 02/23/2018   HCT 34.2 (L) 02/23/2018   MCV 90.0 02/23/2018   PLT 224 02/23/2018      Component Value Date/Time   NA 139 02/23/2018 0501   K 4.1 02/23/2018 0501   CL 106 02/23/2018 0501   CO2 26 02/23/2018 0501   GLUCOSE 142 (H) 02/23/2018 0501   BUN 13 02/23/2018 0501   CREATININE 0.75 02/23/2018 0501   CALCIUM 8.8 (L) 02/23/2018 0501   PROT 6.8 02/17/2018 1050   ALBUMIN 4.0 02/17/2018 1050   AST 23 02/17/2018 1050   ALT 25 02/17/2018 1050   ALKPHOS 74 02/17/2018 1050   BILITOT 1.0 02/17/2018 1050   GFRNONAA >60 02/23/2018 0501   GFRAA >60 02/23/2018 0501      ASSESSMENT AND PLAN 72 y.o. year old male  has a past medical history of Allergy, Arthritis, Complication of anesthesia,  Erectile dysfunction, Gout, Hyperlipidemia, Hypertension, Kidney stone, Knee pain, OSA on CPAP, and Sleep apnea. here with :  1.  Obstructive sleep apnea on CPAP  The patient CPAP download shows excellent compliance and good treatment of his apnea.  I will adjust his pressure to 15 cm of water.  He is encouraged to continue using the CPAP nightly and greater than 4 hours each night.  If his symptoms worsen or he develop new symptoms he should let us know.  We will follow-up in 1 year or sooner if needed.   I spent 15 minutes with the patient. 50% of this time was spent reviewing CPAP download   Ward Givens, MSN, NP-C 11/02/2018, 10:33 AM [facility] [address] [place], [place] [address] [phone]

## 2018-11-02 NOTE — Patient Instructions (Signed)
Your Plan:  Continue using CPAP nightly Change pressure to 15 cm h20- order sent to DME company If your symptoms worsen or you develop new symptoms please let us know.   Thank you for coming to see [place] at [facility]. I hope we have been able to provide you high quality care today.  You may receive a patient satisfaction survey over the next few weeks. We would appreciate your feedback and comments so that we may continue to improve ourselves and the health of our patients.

## 2018-11-02 NOTE — Progress Notes (Signed)
Community message sent to [facility] re: change CPAP pressure.

## 2018-11-03 ENCOUNTER — Encounter: Payer: Self-pay | Admitting: Gastroenterology

## 2019-01-17 ENCOUNTER — Ambulatory Visit: Payer: Medicare Other | Admitting: Neurology

## 2019-05-25 ENCOUNTER — Other Ambulatory Visit: Payer: Self-pay | Admitting: Internal Medicine

## 2019-05-25 DIAGNOSIS — R6889 Other general symptoms and signs: Secondary | ICD-10-CM

## 2019-06-11 ENCOUNTER — Other Ambulatory Visit: Payer: Self-pay

## 2019-06-11 ENCOUNTER — Ambulatory Visit
Admission: RE | Admit: 2019-06-11 | Discharge: 2019-06-11 | Disposition: A | Discharge status: Home / Self Care | Payer: Medicare Other | Source: Ambulatory Visit | Attending: Internal Medicine | Admitting: Internal Medicine

## 2019-06-11 DIAGNOSIS — R6889 Other general symptoms and signs: Secondary | ICD-10-CM

## 2019-06-11 MED ORDER — IOPAMIDOL (ISOVUE-300) INJECTION 61%
100.0000 mL | Freq: Once | INTRAVENOUS | Status: AC | PRN
  Administered 2019-06-11: 100 mL via INTRAVENOUS

## 2019-06-19 ENCOUNTER — Telehealth: Payer: Self-pay | Admitting: Infectious Disease

## 2019-06-19 NOTE — Telephone Encounter (Signed)
COVID-19 Pre-Screening Questions:06/19/19 ° °Do you currently have a fever (>100 °F), chills or unexplained body aches? NO ° °Are you currently experiencing new cough, shortness of breath, sore throat, runny nose? NO °•  °Have you recently travelled outside the state of [place] in the last 14 days? NO °•  °Have you been in contact with someone that is currently pending confirmation of Covid19 testing or has been confirmed to have the Covid19 virus?  NO ° °**If the patient answers NO to ALL questions -  advise the patient to please call the clinic before coming to the office should any symptoms develop.  ° ° ° °

## 2019-06-20 ENCOUNTER — Other Ambulatory Visit: Payer: Self-pay

## 2019-06-20 ENCOUNTER — Encounter: Payer: Self-pay | Admitting: Infectious Disease

## 2019-06-20 ENCOUNTER — Ambulatory Visit: Payer: Medicare Other | Admitting: Infectious Disease

## 2019-06-20 VITALS — BP 164/88 | HR 79 | Temp 98.6°F | Wt 157.0 lb

## 2019-06-20 DIAGNOSIS — M1611 Unilateral primary osteoarthritis, right hip: Secondary | ICD-10-CM | POA: Diagnosis not present

## 2019-06-20 DIAGNOSIS — R5382 Chronic fatigue, unspecified: Secondary | ICD-10-CM

## 2019-06-20 DIAGNOSIS — M25531 Pain in right wrist: Secondary | ICD-10-CM

## 2019-06-20 DIAGNOSIS — R634 Abnormal weight loss: Secondary | ICD-10-CM

## 2019-06-20 DIAGNOSIS — R918 Other nonspecific abnormal finding of lung field: Secondary | ICD-10-CM

## 2019-06-20 DIAGNOSIS — M25539 Pain in unspecified wrist: Secondary | ICD-10-CM

## 2019-06-20 DIAGNOSIS — M25532 Pain in left wrist: Secondary | ICD-10-CM

## 2019-06-20 DIAGNOSIS — K4090 Unilateral inguinal hernia, without obstruction or gangrene, not specified as recurrent: Secondary | ICD-10-CM

## 2019-06-20 DIAGNOSIS — R972 Elevated prostate specific antigen [PSA]: Secondary | ICD-10-CM

## 2019-06-20 DIAGNOSIS — F17210 Nicotine dependence, cigarettes, uncomplicated: Secondary | ICD-10-CM | POA: Diagnosis not present

## 2019-06-20 DIAGNOSIS — R6889 Other general symptoms and signs: Secondary | ICD-10-CM | POA: Diagnosis not present

## 2019-06-20 HISTORY — DX: Abnormal weight loss: R63.4

## 2019-06-20 HISTORY — DX: Other general symptoms and signs: R68.89

## 2019-06-20 HISTORY — DX: Other nonspecific abnormal finding of lung field: R91.8

## 2019-06-20 HISTORY — DX: Pain in unspecified wrist: M25.539

## 2019-06-20 NOTE — Progress Notes (Signed)
Chief complaint: Chills and feeling cold along with involuntary weight loss  Subjective:    Patient ID: John Potts, male    DOB: August 06, 1946, 73 y.o.   MRN: [id number]  Reason for infectious disease consult: Unexplained chills for several years duration that have worsened along with 20 pound weight loss  HPI  John Potts is a 73 year old Caucasian man with a past medical history significant for hypertension hyperlipidemia, former smoking, obstructive sleep apnea, history of total hip arthroplasty in 2019, who has had several years of feeling cold throughout the year though worse in wintertime.  He states during the day it is worse in the afternoon around 5:00.  It improves if he puts on extra layers of close.  The worst time is typically in the winter.  Specific situation which it is at the worst is typically if he is making it about to have sex with his wife, and after he has taken a Viagra.  He has had his thyroid function tested repeatedly.  He does have an elevated PSA and is followed [facility] is due to see Dr. Dorene Ar next week.  He also has gout which is been well controlled with allopurinol and intermittent need for colchicine.  His primary care physician Dr. Sharlett Iles worked him up with thyroid function tests again that were normal.  Other symptoms that he has had in this past year are stiffness and pain in his wrists bilaterally.  He notices the cold sensation most lead to start underneath his neck and then to radiate outwards.  He states occasionally he will wake up in drenching sweats but this has not happened for at least a month.  He endorses losing 20 pounds going from 170 pounds 150 pounds despite eating the same if not more food.  He had a screening colonoscopy in end of 2019 that was clean.  Further work-up is been done and unremarkable includes a CBC and metabolic panel.  He also had a QuantiFERON gold test that was negative he had a CT of the chest  abdomen pelvis which showed some chronic lung nodules that were stable in size as well as some nodules that appeared benign in the adrenal glands.  Other than the symptoms of arthritis he did have increased bowel movements noted by his wife and recent telephone visit that he had with Dr. Sharlett Iles.  His right sided total hip arthroplasty is doing "great".  Apparently the sensation of feeling cold is been going on for years and preceded his THA operation and is something that he has had for quite a long time.  In terms of travel history he was born with his father being in the [place] and if I understand the sequence correctly initially lived in [place] followed by [place], then later was in [place] before finally moving to [place] where he now lives.  He did travel extensively throughout the [place] though has been through pretty much every region here.  He is also been down to [place] and [place] and been throughout [place].  He is not had any known tuberculosis exposure.  He quit smoking this January.  No if he ever had mononucleosis.  He states he has never had a sexual transmitted infection and is in a monogamous relationship with his wife.  He has a daughter who apparently is being treated by Dr. Knox Royalty for "Chronic Lyme."   He had also noticed a right-sided hernia that happened recently.  Past Medical History:  Diagnosis Date  . Allergy   . Arthritis   . Cold intolerance 06/20/2019  . Complication of anesthesia    Difficult urinating  . Erectile dysfunction   . Gout   . Hyperlipidemia   . Hypertension   . Kidney stone   . Knee pain   . Lung nodules 06/20/2019  . OSA on CPAP   . Sleep apnea    uses cpap  . Weight loss 06/20/2019  . Wrist pain 06/20/2019    Past Surgical History:  Procedure Laterality Date  . CATARACT EXTRACTION, BILATERAL    . LIPOMA EXCISION    . MOHS SURGERY    . TONSILLECTOMY    . TOTAL HIP ARTHROPLASTY Right  02/22/2018   Procedure: RIGHT TOTAL HIP ARTHROPLASTY ANTERIOR APPROACH;  Surgeon: Gaynelle Arabian, MD;  Location: [facility];  Service: Orthopedics;  Laterality: Right;    Family History  Problem Relation Age of Onset  . Hypertension Mother   . Uterine cancer Mother   . Hypertension Father   . Stomach cancer Neg Hx   . Rectal cancer Neg Hx   . Esophageal cancer Neg Hx   . Colon polyps Neg Hx   . Colon cancer Neg Hx       Social History   Socioeconomic History  . Marital status: Married    Spouse name: Not on file  . Number of children: Not on file  . Years of education: Not on file  . Highest education level: Not on file  Occupational History  . Occupation: retired  Scientific laboratory technician  . Financial resource strain: Not on file  . Food insecurity    Worry: Not on file    Inability: Not on file  . Transportation needs    Medical: Not on file    Non-medical: Not on file  Tobacco Use  . Smoking status: Former Smoker    Packs/day: 0.50    Years: 50.00    Pack years: 25.00    Types: Cigars, Cigarettes    Quit date: 04/13/2017    Years since quitting: 2.1  . Smokeless tobacco: Never Used  . Tobacco comment: Pt smokes intermittently  Substance and Sexual Activity  . Alcohol use: Yes    Alcohol/week: 7.0 standard drinks    Types: 7 Standard drinks or equivalent per week    Comment: [employer]-Daily  . Drug use: No  . Sexual activity: Not on file  Lifestyle  . Physical activity    Days per week: Not on file    Minutes per session: Not on file  . Stress: Not on file  Relationships  . Social Herbalist on phone: Not on file    Gets together: Not on file    Attends religious service: Not on file    Active member of club or organization: Not on file    Attends meetings of clubs or organizations: Not on file    Relationship status: Not on file  Other Topics Concern  . Not on file  Social History Narrative   Drinks 2 cups of coffee in the morning.    Allergies  Allergen  Reactions  . Metoprolol     Sluggish, Tired  . Wellbutrin [Bupropion]     Fatigue, increased BP      Current Outpatient Medications:  .  allopurinol (ZYLOPRIM) 300 MG tablet, Take 300 mg by mouth daily., Disp: , Rfl:  .  amLODipine (NORVASC) 5 MG tablet, Take 5 mg by  mouth daily., Disp: , Rfl:  .  aspirin 81 MG chewable tablet, aspirin 81 mg chewable tablet  Chew 1 tablet every day by oral route., Disp: , Rfl:  .  famotidine (PEPCID AC) 10 MG chewable tablet, Chew 10 mg by mouth daily as needed for heartburn., Disp: , Rfl:  .  irbesartan (AVAPRO) 300 MG tablet, Take 300 mg by mouth daily., Disp: , Rfl:  .  Melatonin 3 MG TABS, Take 3 mg by mouth at bedtime as needed (sleep)., Disp: , Rfl:  .  methocarbamol (ROBAXIN) 500 MG tablet, Take 1 tablet (500 mg total) by mouth every 6 (six) hours as needed for muscle spasms., Disp: 60 tablet, Rfl: 0 .  mometasone (NASONEX) 50 MCG/ACT nasal spray, Place 2 sprays into the nose daily. (Patient taking differently: Place 2 sprays into the nose daily as needed (allergies). ), Disp: 17 g, Rfl: 11 .  Multiple Vitamins-Minerals (MULTIVITAMIN ADULT PO), multivitamin, Disp: , Rfl:  .  Omega-3 Fatty Acids (FISH OIL PO), Take by mouth., Disp: , Rfl:  .  pravastatin (PRAVACHOL) 20 MG tablet, Take 20 mg by mouth daily., Disp: , Rfl:  .  tadalafil (CIALIS) 5 MG tablet, Take by mouth., Disp: , Rfl:  .  Tamsulosin HCl (FLOMAX) 0.4 MG CAPS, Take 0.4 mg by mouth daily. , Disp: , Rfl:   Current Facility-Administered Medications:  .  0.9 %  sodium chloride infusion, 500 mL, Intravenous, Once, Ladene Artist, MD   Review of Systems  Constitutional: Positive for chills and fatigue. Negative for fever.  HENT: Negative for congestion and sore throat.   Eyes: Negative for photophobia.  Respiratory: Negative for cough, shortness of breath and wheezing.   Cardiovascular: Negative for chest pain, palpitations and leg swelling.  Gastrointestinal: Negative for abdominal  pain, blood in stool, constipation, diarrhea, nausea and vomiting.  Genitourinary: Negative for dysuria, flank pain, hematuria, scrotal swelling and testicular pain.  Musculoskeletal: Positive for arthralgias. Negative for back pain, joint swelling, myalgias, neck pain and neck stiffness.  Skin: Negative for color change, pallor, rash and wound.  Neurological: Negative for dizziness, seizures, syncope, weakness, light-headedness and headaches.  Hematological: Does not bruise/bleed easily.  Psychiatric/Behavioral: Negative for behavioral problems, confusion, decreased concentration, hallucinations and suicidal ideas. The patient is not nervous/anxious.        Objective:   Physical Exam Constitutional:      General: He is not in acute distress.    Appearance: He is not diaphoretic.  HENT:     Head: Normocephalic and atraumatic.     Right Ear: External ear normal.     Left Ear: External ear normal.     Nose: Nose normal.     Mouth/Throat:     Pharynx: No oropharyngeal exudate.  Eyes:     General: No scleral icterus.    Conjunctiva/sclera: Conjunctivae normal.     Pupils: Pupils are equal, round, and reactive to light.  Neck:     Musculoskeletal: Normal range of motion and neck supple.  Cardiovascular:     Rate and Rhythm: Normal rate and regular rhythm.     Heart sounds: Normal heart sounds. No murmur. No friction rub. No gallop.   Pulmonary:     Effort: Pulmonary effort is normal. No respiratory distress.     Breath sounds: Normal breath sounds. No wheezing or rales.  Abdominal:     General: Bowel sounds are normal. There is no distension.     Palpations: Abdomen is soft. There is no  mass.     Tenderness: There is no abdominal tenderness. There is no rebound.  Genitourinary:    Penis: Normal.     Musculoskeletal: Normal range of motion.        General: No tenderness.     Right wrist: Normal.     Left wrist: Normal.     Right hand: Normal.     Left hand: Normal.   Lymphadenopathy:     Head:     Right side of head: No submandibular, tonsillar, preauricular, posterior auricular or occipital adenopathy.     Left side of head: No submental, tonsillar or posterior auricular adenopathy.     Cervical: No cervical adenopathy.     Right cervical: No superficial, deep or posterior cervical adenopathy.    Left cervical: No superficial or deep cervical adenopathy.     Upper Body:     Right upper body: No supraclavicular adenopathy.     Left upper body: No supraclavicular adenopathy.  Skin:    General: Skin is warm and dry.     Coloration: Skin is not pale.     Findings: No erythema or rash.  Neurological:     General: No focal deficit present.     Mental Status: He is alert and oriented to person, place, and time.     Coordination: Coordination normal.  Psychiatric:        Mood and Affect: Mood normal.        Behavior: Behavior normal.        Thought Content: Thought content normal.        Judgment: Judgment normal.           Assessment & Plan:   Chills, and involuntary weight loss: States that the weight loss is actually stabilized he is now gaining weight.  That being the case I will do some further fever of unknown origin work-up on this patient given his symptoms of chills night sweats and weight loss.  These will include checking for hepatitis viruses, HIV checking his CMV and EBV antibodies sed rate C-reactive protein, LDH, CPK rheumatoid factor ANA. We will do a work-up for some dimorphic fungi as listed below  Lung nodules and adrenal nodules: Given his extensive travel history we will check some tests for dimorphic fungi with a histoplasma antigen from urine Blastomyces antigen from urine, Coccidioides antibody.  I believe I neglected to order a cryptococcus antigen from serum but we can do that in the future if necessary.  I am quite skeptical that any of these infections is floridly active though his lung nodules could be related to anyone  of these particular organisms.  Elevated PSA: I would have some concerns with this and weight loss about whether he might have prostate cancer.  Apparently his PSA is stable and he is going to see his urologist soon  I spent greater than 60 minutes with the patient including greater than 50% of time in face to face counsel of the patient reasons for lab testing and work-up that has been done and will be done today, the nature of work-up for fever unknown origin, reasons why we are applying the same type of work-up to his symptoms of cold chills, night sweats and weight loss, reviewing his CT scan personally including the areas where there is nodularities in the lungs and in coordination of his care.

## 2019-06-25 LAB — LACTATE DEHYDROGENASE: LDH: 124 U/L (ref 120–250)

## 2019-06-25 LAB — MVISTA BLASTOMYCES QNT AG, URINE
Interpretation: NEGATIVE
Result (ng/ml): NOT DETECTED ng/mL

## 2019-06-25 LAB — COMPLETE METABOLIC PANEL WITH GFR
AG Ratio: 2 (calc) (ref 1.0–2.5)
ALT: 22 U/L (ref 9–46)
AST: 19 U/L (ref 10–35)
Albumin: 4.5 g/dL (ref 3.6–5.1)
Alkaline phosphatase (APISO): 80 U/L (ref 35–144)
BUN: 12 mg/dL (ref 7–25)
CO2: 25 mmol/L (ref 20–32)
Calcium: 9.7 mg/dL (ref 8.6–10.3)
Chloride: 107 mmol/L (ref 98–110)
Creat: 0.85 mg/dL (ref 0.70–1.18)
GFR, Est African American: 102 mL/min/{1.73_m2} (ref >=60)
GFR, Est Non African American: 88 mL/min/{1.73_m2} (ref >=60)
Globulin: 2.3 g/dL (calc) (ref 1.9–3.7)
Glucose, Bld: 114 mg/dL — ABNORMAL HIGH (ref 65–99)
Potassium: 3.9 mmol/L (ref 3.5–5.3)
Sodium: 140 mmol/L (ref 135–146)
Total Bilirubin: 0.5 mg/dL (ref 0.2–1.2)
Total Protein: 6.8 g/dL (ref 6.1–8.1)

## 2019-06-25 LAB — CBC WITH DIFFERENTIAL/PLATELET
Absolute Monocytes: 561 cells/uL (ref 200–950)
Basophils Absolute: 7 cells/uL (ref 0–200)
Basophils Relative: 0.1 %
Eosinophils Absolute: 398 cells/uL (ref 15–500)
Eosinophils Relative: 5.6 %
HCT: 39.9 % (ref 38.5–50.0)
Hemoglobin: 13.7 g/dL (ref 13.2–17.1)
Lymphs Abs: 1967 cells/uL (ref 850–3900)
MCH: 30.4 pg (ref 27.0–33.0)
MCHC: 34.3 g/dL (ref 32.0–36.0)
MCV: 88.7 fL (ref 80.0–100.0)
MPV: 11.4 fL (ref 7.5–12.5)
Monocytes Relative: 7.9 %
Neutro Abs: 4168 cells/uL (ref 1500–7800)
Neutrophils Relative %: 58.7 %
Platelets: 237 10*3/uL (ref 140–400)
RBC: 4.5 10*6/uL (ref 4.20–5.80)
RDW: 12.3 % (ref 11.0–15.0)
Total Lymphocyte: 27.7 %
WBC: 7.1 10*3/uL (ref 3.8–10.8)

## 2019-06-25 LAB — HEPATITIS C ANTIBODY
Hepatitis C Ab: NONREACTIVE
SIGNAL TO CUT-OFF: 0.01 (ref <1.00)

## 2019-06-25 LAB — COCCIDIOIDES ANTIBODIES: COCCIDIOIDES AB, CF, SERUM: <1:2 {titer}

## 2019-06-25 LAB — ANA: Anti Nuclear Antibody (ANA): POSITIVE — AB

## 2019-06-25 LAB — HEPATITIS B SURFACE ANTIBODY, QUANTITATIVE: Hep B S AB Quant (Post): <5 m[IU]/mL — ABNORMAL LOW (ref >=10)

## 2019-06-25 LAB — RHEUMATOID FACTOR: Rheumatoid fact SerPl-aCnc: <14 IU/mL (ref <14)

## 2019-06-25 LAB — EPSTEIN-BARR VIRUS VCA ANTIBODY PANEL
EBV NA IgG: <18 U/mL
EBV VCA IgG: 132 U/mL — ABNORMAL HIGH
EBV VCA IgM: <36 U/mL

## 2019-06-25 LAB — ANTI-NUCLEAR AB-TITER (ANA TITER): ANA Titer 1: 1:80 {titer} — ABNORMAL HIGH

## 2019-06-25 LAB — HEPATITIS B SURFACE ANTIGEN: Hepatitis B Surface Ag: NONREACTIVE

## 2019-06-25 LAB — C-REACTIVE PROTEIN: CRP: 0.5 mg/L (ref <8.0)

## 2019-06-25 LAB — HISTOPLASMA ANTIGEN, URINE: Histoplasma Antigen, urine: <0.2

## 2019-06-25 LAB — EPSTEIN-BARR VIRUS EARLY D ANTIGEN ANTIBODY, IGG: EBV EA IgG: 42.7 U/mL — ABNORMAL HIGH

## 2019-06-25 LAB — SEDIMENTATION RATE: Sed Rate: 2 mm/h (ref 0–20)

## 2019-06-25 LAB — HIV ANTIBODY (ROUTINE TESTING W REFLEX): HIV 1&2 Ab, 4th Generation: NONREACTIVE

## 2019-06-25 LAB — CK: Total CK: 63 U/L (ref 44–196)

## 2019-06-25 LAB — CYTOMEGALOVIRUS ANTIBODY, IGG: Cytomegalovirus Ab-IgG: >10 U/mL — ABNORMAL HIGH

## 2019-06-25 LAB — CMV IGM: CMV IgM: <30 AU/mL

## 2019-07-18 ENCOUNTER — Ambulatory Visit: Payer: Medicare Other | Admitting: Infectious Disease

## 2019-07-23 ENCOUNTER — Encounter: Payer: Self-pay | Admitting: Infectious Disease

## 2019-07-23 ENCOUNTER — Ambulatory Visit (INDEPENDENT_AMBULATORY_CARE_PROVIDER_SITE_OTHER): Payer: Medicare Other | Admitting: Infectious Disease

## 2019-07-23 DIAGNOSIS — R6889 Other general symptoms and signs: Secondary | ICD-10-CM

## 2019-07-23 DIAGNOSIS — R972 Elevated prostate specific antigen [PSA]: Secondary | ICD-10-CM

## 2019-07-23 DIAGNOSIS — R918 Other nonspecific abnormal finding of lung field: Secondary | ICD-10-CM | POA: Diagnosis not present

## 2019-07-23 DIAGNOSIS — R634 Abnormal weight loss: Secondary | ICD-10-CM | POA: Diagnosis not present

## 2019-07-23 HISTORY — DX: Elevated prostate specific antigen (PSA): R97.20

## 2019-07-23 NOTE — Progress Notes (Signed)
Virtual Visit via Telephone Note  I connected with John Potts on 07/23/19 at 10:45 AM EDT by telephone and verified that I am speaking with the correct person using two identifiers.  Location: Patient: Home  Provider: [facility] clinic   I discussed the limitations, risks, security and privacy concerns of performing an evaluation and management service by telephone and the availability of in person appointments. I also discussed with the patient that there may be a patient responsible charge related to this service. The patient expressed understanding and agreed to proceed.   History of Present Illness:   John Potts is a 73 year old Caucasian man with a past medical history significant for hypertension hyperlipidemia, former smoking, obstructive sleep apnea, history of total hip arthroplasty in 2019, who has had several years of feeling cold throughout the year though worse in wintertime.  I saw him in late June to work-up his symptoms of feeling cold and his history of weight loss though he regained the weight again.  He stated then that it was during the cold feeling day it is worse in the afternoon around 5:00.  It improves if he puts on extra layers of close.  The worst time is typically in the winter.  Specific situation which it is at the worst is typically if he is making it about to have sex with his wife, and after he has taken a Viagra.  He has had his thyroid function tested repeatedly.  He does have an elevated PSA and is followed [facility] is due to see Dr. Dorene Ar who found it before and was happy with this and did not see a need for a biopsy  He also has gout which is been well controlled with allopurinol and intermittent need for colchicine.  His primary care physician Dr. Sharlett Iles worked him up with thyroid function tests again that were normal.  Other symptoms that he has had in this past year are stiffness and pain in his wrists bilaterally.  He notices  the cold sensation most lead to start underneath his neck and then to radiate outwards.  He states occasionally he will wake up in drenching sweats but this has not happened for at least a month.  He endorses losing 20 pounds going from 170 pounds 150 pounds despite eating the same if not more food.  He states he is now regained most of this weight back.  He had a screening colonoscopy in end of 2019 that was clean.  Further work-up is been done and unremarkable includes a CBC and metabolic panel.  He also had a QuantiFERON gold test that was negative he had a CT of the chest abdomen pelvis which showed some chronic lung nodules that were stable in size as well as some nodules that appeared benign in the adrenal glands.  Other than the symptoms of arthritis he did have increased bowel movements noted by his wife and recent telephone visit that he had with Dr. Sharlett Iles.  His right sided total hip arthroplasty is doing "great".  Apparently the sensation of feeling cold is been going on for years and preceded his THA operation and is something that he has had for quite a long time.  In terms of travel history he was born with his father being in the [place] and if I understand the sequence correctly initially lived in [place] followed by [place], then later was in [place] before finally moving to [place] where he now lives.  He did  travel extensively throughout the [place] though has been through pretty much every region here.  He is also been down to [place] and [place] and been throughout [place].  He is not had any known tuberculosis exposure.  He quit smoking this January.  He is not sure if he ever had mononucleosis.  He states he has never had a sexual transmitted infection and is in a monogamous relationship with his wife.  He has a daughter who apparently is being treated by Dr. Knox Royalty for "Chronic Lyme."    We did further testing here which  included negative testing for HIV hepatitis B and C.  We checked Epstein-Barr and CMV titers which showed him to have a prior infection with these viruses which of course stay with humans for the entirety of the lives.  There is no evidence however of active infection based on serologies his sed rate and C-reactive protein were normal.  His test for histoplasma and Coccidioides were negative as well as for Blastomyces.  He had normal CBC and comprehensive metabolic panel his rheumatoid factor was negative he had a slightly positive ANA with a titer of 1-80.  Significance this however I think is unlikely to be representative of an autoimmune condition.  In talking to him over the phone today he tells me that his father also felt cold all the time and he wonders if some of this may be hereditary trait.  His father apparently was quite healthy and lived to the age of 50 when he passed away this fall.    Observations/Objective:  Feeling cold  Weight loss that is resolving  Elevated PSA  Assessment and Plan:  There is no need for further aggressive work-up of his feeling cold at this time.  He can continue to follow-up with primary care and urologist as regarding his other chronic medical problems and elevated PSA.  He can come back and see [place] as needed Follow Up Instructions:    I discussed the assessment and treatment plan with the patient. The patient was provided an opportunity to ask questions and all were answered. The patient agreed with the plan and demonstrated an understanding of the instructions.   The patient was advised to call back or seek an in-person evaluation if the symptoms worsen or if the condition fails to improve as anticipated.  I provided 15  minutes of non-face-to-face time during this encounter.   Alcide Evener, MD

## 2019-11-07 ENCOUNTER — Other Ambulatory Visit: Payer: Self-pay

## 2019-11-07 ENCOUNTER — Encounter: Payer: Self-pay | Admitting: Adult Health

## 2019-11-07 ENCOUNTER — Ambulatory Visit: Payer: Medicare Other | Admitting: Adult Health

## 2019-11-07 VITALS — BP 137/80 | HR 89 | Temp 97.9°F | Ht 68.0 in | Wt 160.2 lb

## 2019-11-07 DIAGNOSIS — Z9989 Dependence on other enabling machines and devices: Secondary | ICD-10-CM

## 2019-11-07 DIAGNOSIS — G4733 Obstructive sleep apnea (adult) (pediatric): Secondary | ICD-10-CM

## 2019-11-07 NOTE — Patient Instructions (Signed)
Continue using CPAP nightly and greater than 4 hours each night °If your symptoms worsen or you develop new symptoms please let us know.  ° °

## 2019-11-07 NOTE — Progress Notes (Signed)
PATIENT: John Potts DOB: 11-25-1946  REASON FOR VISIT: follow up HISTORY FROM: patient  HISTORY OF PRESENT ILLNESS: Today 11/07/19:  John Potts is a 73 year old male with a history of obstructive sleep apnea on CPAP.  He returns today for follow-up.  His download indicates that he uses machine nightly for compliance of 100%.  He uses machine greater than 4 hours each night.  On average he uses his machine 8 hours and 50 minutes.  His residual AHI is 3.6 on 15 cm of water with EPR of 2.  His leak in the 95th percentile is 0.3.  Overall he reports that he is doing well.  He continues to notice the benefit with the CPAP.  He returns today for an evaluation.  HISTORY 11/02/18:  John Potts is a 73 year old male with a history of obstructive sleep.  He returns today for follow-up.  His CPAP download indicates that he uses machine nightly for compliance of 100%.  He uses machine greater than 4 hours each night.  On average he uses his machine 8 hours and 39 minutes.  His residual AHI is 4.4 on 10 to 15 cm of water with EPR of 2.  He does not have a leak.  The patient states he is unsure if he likes the machine starting at a lower pressure.  Otherwise he feels that everything is working well.  His Epworth sleepiness score 4 and fatigue severity score is 13.  He returns today for evaluation.Marland Kitchen   REVIEW OF SYSTEMS: Out of a complete 14 system review of symptoms, the patient complains only of the following symptoms, and all other reviewed systems are negative.  See HPI  ALLERGIES: Allergies  Allergen Reactions  . Metoprolol     Sluggish, Tired  . Wellbutrin [Bupropion]     Fatigue, increased BP     HOME MEDICATIONS: Outpatient Medications Prior to Visit  Medication Sig Dispense Refill  . allopurinol (ZYLOPRIM) 300 MG tablet Take 300 mg by mouth daily.    Marland Kitchen amLODipine (NORVASC) 5 MG tablet Take 5 mg by mouth daily.    . Ascorbic Acid (VITAMIN C PO) Take 1 g by mouth daily.    Marland Kitchen aspirin 81  MG chewable tablet aspirin 81 mg chewable tablet  Chew 1 tablet every day by oral route.    . Cholecalciferol (VITAMIN D3 PO) Take 500 Units by mouth once a week. Monday, Wednesday and Friday    . famotidine (PEPCID AC) 10 MG chewable tablet Chew 10 mg by mouth daily as needed for heartburn.    . irbesartan (AVAPRO) 300 MG tablet Take 300 mg by mouth daily.    . Melatonin 3 MG TABS Take 3 mg by mouth at bedtime as needed (sleep).    . mometasone (NASONEX) 50 MCG/ACT nasal spray Place 2 sprays into the nose daily. (Patient taking differently: Place 2 sprays into the nose daily as needed (allergies). ) 17 g 11  . Multiple Vitamins-Minerals (MULTIVITAMIN ADULT PO) multivitamin    . Nattokinase 100 MG CAPS Take 100 mg by mouth daily.    . Omega-3 Fatty Acids (FISH OIL PO) Take by mouth.    . pravastatin (PRAVACHOL) 20 MG tablet Take 20 mg by mouth daily.    . Quercetin 250 MG TABS Take 250 mg by mouth daily.    . tadalafil (CIALIS) 5 MG tablet Take by mouth.    . Tamsulosin HCl (FLOMAX) 0.4 MG CAPS Take 0.4 mg by mouth daily.     Marland Kitchen  zinc gluconate 50 MG tablet Take 50 mg by mouth daily.    . methocarbamol (ROBAXIN) 500 MG tablet Take 1 tablet (500 mg total) by mouth every 6 (six) hours as needed for muscle spasms. 60 tablet 0   Facility-Administered Medications Prior to Visit  Medication Dose Route Frequency Provider Last Rate Last Dose  . 0.9 %  sodium chloride infusion  500 mL Intravenous Once Ladene Artist, MD        PAST MEDICAL HISTORY: Past Medical History:  Diagnosis Date  . Allergy   . Arthritis   . Cold intolerance 06/20/2019  . Complication of anesthesia    Difficult urinating  . Elevated PSA 07/23/2019  . Erectile dysfunction   . Gout   . Hyperlipidemia   . Hypertension   . Kidney stone   . Knee pain   . Lung nodules 06/20/2019  . OSA on CPAP   . Sleep apnea    uses cpap  . Weight loss 06/20/2019  . Wrist pain 06/20/2019    PAST SURGICAL HISTORY: Past Surgical  History:  Procedure Laterality Date  . CATARACT EXTRACTION, BILATERAL    . LIPOMA EXCISION    . MOHS SURGERY    . TONSILLECTOMY    . TOTAL HIP ARTHROPLASTY Right 02/22/2018   Procedure: RIGHT TOTAL HIP ARTHROPLASTY ANTERIOR APPROACH;  Surgeon: Gaynelle Arabian, MD;  Location: [facility];  Service: Orthopedics;  Laterality: Right;    FAMILY HISTORY: Family History  Problem Relation Age of Onset  . Hypertension Mother   . Uterine cancer Mother   . Hypertension Father   . Stomach cancer Neg Hx   . Rectal cancer Neg Hx   . Esophageal cancer Neg Hx   . Colon polyps Neg Hx   . Colon cancer Neg Hx     SOCIAL HISTORY: Social History   Socioeconomic History  . Marital status: Married    Spouse name: Not on file  . Number of children: Not on file  . Years of education: Not on file  . Highest education level: Not on file  Occupational History  . Occupation: retired  Scientific laboratory technician  . Financial resource strain: Not on file  . Food insecurity    Worry: Not on file    Inability: Not on file  . Transportation needs    Medical: Not on file    Non-medical: Not on file  Tobacco Use  . Smoking status: Former Smoker    Packs/day: 0.50    Years: 50.00    Pack years: 25.00    Types: Cigars, Cigarettes    Quit date: 04/13/2017    Years since quitting: 2.5  . Smokeless tobacco: Never Used  . Tobacco comment: Pt smokes intermittently  Substance and Sexual Activity  . Alcohol use: Yes    Alcohol/week: 7.0 standard drinks    Types: 7 Standard drinks or equivalent per week    Comment: [employer]-Daily  . Drug use: No  . Sexual activity: Not on file  Lifestyle  . Physical activity    Days per week: Not on file    Minutes per session: Not on file  . Stress: Not on file  Relationships  . Social Herbalist on phone: Not on file    Gets together: Not on file    Attends religious service: Not on file    Active member of club or organization: Not on file    Attends meetings of clubs or  organizations: Not on file  Relationship status: Not on file  . Intimate partner violence    Fear of current or ex partner: Not on file    Emotionally abused: Not on file    Physically abused: Not on file    Forced sexual activity: Not on file  Other Topics Concern  . Not on file  Social History Narrative   Drinks 2 cups of coffee in the morning.      PHYSICAL EXAM  Vitals:   11/07/19 1115  BP: 137/80  Pulse: 89  Temp: 97.9 F (36.6 C)  TempSrc: Oral  Weight: 160 lb 3.2 oz (72.7 kg)  Height: 5\' 8"  (1.727 m)   Body mass index is 24.36 kg/m.   Generalized: Well developed, in no acute distress  Chest: Lungs clear to auscultation bilaterally  Neurological examination  Mentation: Alert oriented to time, place, history taking. Follows all commands speech and language fluent Cranial nerve II-XII: Extraocular movements were full, visual field were full on confrontational test Head turning and shoulder shrug  were normal and symmetric. Motor: The motor testing reveals 5 over 5 strength of all 4 extremities. Good symmetric motor tone is noted throughout.  Sensory: Sensory testing is intact to soft touch on all 4 extremities. No evidence of extinction is noted.  Gait and station: Gait is normal.  .   DIAGNOSTIC DATA (LABS, IMAGING, TESTING) - I reviewed patient records, labs, notes, testing and imaging myself where available.  Lab Results  Component Value Date   WBC 7.1 06/20/2019   HGB 13.7 06/20/2019   HCT 39.9 06/20/2019   MCV 88.7 06/20/2019   PLT 237 06/20/2019      Component Value Date/Time   NA 140 06/20/2019 1446   K 3.9 06/20/2019 1446   CL 107 06/20/2019 1446   CO2 25 06/20/2019 1446   GLUCOSE 114 (H) 06/20/2019 1446   BUN 12 06/20/2019 1446   CREATININE 0.85 06/20/2019 1446   CALCIUM 9.7 06/20/2019 1446   PROT 6.8 06/20/2019 1446   ALBUMIN 4.0 02/17/2018 1050   AST 19 06/20/2019 1446   ALT 22 06/20/2019 1446   ALKPHOS 74 02/17/2018 1050   BILITOT  0.5 06/20/2019 1446   GFRNONAA 88 06/20/2019 1446   GFRAA 102 06/20/2019 1446      ASSESSMENT AND PLAN 73 y.o. year old male  has a past medical history of Allergy, Arthritis, Cold intolerance (0000000), Complication of anesthesia, Elevated PSA (07/23/2019), Erectile dysfunction, Gout, Hyperlipidemia, Hypertension, Kidney stone, Knee pain, Lung nodules (06/20/2019), OSA on CPAP, Sleep apnea, Weight loss (06/20/2019), and Wrist pain (06/20/2019). here with:  1. Obstructive sleep apnea on CPAP  The patient's CPAP download shows excellent compliance and good treatment of his apnea.  He is encouraged to continue using CPAP nightly and greater than 4 hours each night.  He is advised that if his symptoms worsen or he develops new symptoms he should let us know.  He will follow-up in 1 year or sooner if needed     I spent 15 minutes with the patient. 50% of this time was spent reviewing CPAP download   John Givens, MSN, NP-C 11/07/2019, 11:32 AM [facility] [address], [place], [place] [address] [phone]

## 2020-04-17 ENCOUNTER — Emergency Department (HOSPITAL_COMMUNITY): Payer: Medicare PPO

## 2020-04-17 ENCOUNTER — Emergency Department (HOSPITAL_COMMUNITY)
Admission: EM | Admit: 2020-04-17 | Discharge: 2020-04-18 | Disposition: A | Discharge status: Home / Self Care | Payer: Medicare PPO | Attending: Emergency Medicine | Admitting: Emergency Medicine

## 2020-04-17 ENCOUNTER — Encounter (HOSPITAL_COMMUNITY): Payer: Self-pay | Admitting: Emergency Medicine

## 2020-04-17 ENCOUNTER — Other Ambulatory Visit: Payer: Self-pay

## 2020-04-17 DIAGNOSIS — Z79899 Other long term (current) drug therapy: Secondary | ICD-10-CM | POA: Insufficient documentation

## 2020-04-17 DIAGNOSIS — Z7982 Long term (current) use of aspirin: Secondary | ICD-10-CM | POA: Diagnosis not present

## 2020-04-17 DIAGNOSIS — I10 Essential (primary) hypertension: Secondary | ICD-10-CM | POA: Diagnosis not present

## 2020-04-17 DIAGNOSIS — I491 Atrial premature depolarization: Secondary | ICD-10-CM | POA: Diagnosis not present

## 2020-04-17 DIAGNOSIS — R079 Chest pain, unspecified: Secondary | ICD-10-CM | POA: Diagnosis present

## 2020-04-17 DIAGNOSIS — Z87891 Personal history of nicotine dependence: Secondary | ICD-10-CM | POA: Diagnosis not present

## 2020-04-17 LAB — CBC
HCT: 43.1 % (ref 39.0–52.0)
Hemoglobin: 14.1 g/dL (ref 13.0–17.0)
MCH: 30.5 pg (ref 26.0–34.0)
MCHC: 32.7 g/dL (ref 30.0–36.0)
MCV: 93.3 fL (ref 80.0–100.0)
Platelets: 237 10*3/uL (ref 150–400)
RBC: 4.62 MIL/uL (ref 4.22–5.81)
RDW: 13 % (ref 11.5–15.5)
WBC: 10 10*3/uL (ref 4.0–10.5)
nRBC: 0 % (ref 0.0–0.2)

## 2020-04-17 LAB — TROPONIN I (HIGH SENSITIVITY): Troponin I (High Sensitivity): 6 ng/L (ref <18)

## 2020-04-17 LAB — BASIC METABOLIC PANEL
Anion gap: 9 (ref 5–15)
BUN: 14 mg/dL (ref 8–23)
CO2: 26 mmol/L (ref 22–32)
Calcium: 9.6 mg/dL (ref 8.9–10.3)
Chloride: 107 mmol/L (ref 98–111)
Creatinine, Ser: 0.93 mg/dL (ref 0.61–1.24)
GFR calc Af Amer: >60 mL/min (ref >60)
GFR calc non Af Amer: >60 mL/min (ref >60)
Glucose, Bld: 126 mg/dL — ABNORMAL HIGH (ref 70–99)
Potassium: 4 mmol/L (ref 3.5–5.1)
Sodium: 142 mmol/L (ref 135–145)

## 2020-04-17 MED ORDER — SODIUM CHLORIDE 0.9% FLUSH: 3.0000 mL | Freq: Once | INTRAVENOUS | Status: DC

## 2020-04-17 NOTE — ED Triage Notes (Signed)
Patient states he is having chest tightness, dizziness. Patient states that his wife has an app for the heart and it is showing he has an irregular heart beat.

## 2020-04-18 ENCOUNTER — Encounter: Payer: Self-pay | Admitting: Cardiovascular Disease

## 2020-04-18 LAB — TROPONIN I (HIGH SENSITIVITY): Troponin I (High Sensitivity): 6 ng/L (ref <18)

## 2020-04-18 NOTE — ED Provider Notes (Signed)
[facility] DEPT Provider Note: Georgena Spurling, MD, FACEP  CSN: [id number] MRN: [id number] ARRIVAL: 04/17/20 at 2050 ROOM: [place]  Chest Pain   HISTORY OF PRESENT ILLNESS  04/18/20 1:12 AM John Potts is a 74 y.o. male who started feeling an unusual sensation in his chest the evening before last. He describes it as feeling like his heart is racing or beating funny. It has also been associated with some lightheadedness but no shortness of breath. His wife connected him to a heart monitor at home which showed he had an irregular heartbeat. He has had an intermittent aching pain to the left of his upper sternum which is a 3 out of 10 and feels like pressure or a muscle spasm. Nothing makes this better or worse. It is not made better by rest or worse by exertion. On the monitor he was noted to have frequent premature atrial contractions of which she has no history.   Past Medical History:  Diagnosis Date  . Allergy   . Arthritis   . Cold intolerance 06/20/2019  . Complication of anesthesia    Difficult urinating  . Elevated PSA 07/23/2019  . Erectile dysfunction   . Gout   . Hyperlipidemia   . Hypertension   . Kidney stone   . Knee pain   . Lung nodules 06/20/2019  . OSA on CPAP   . Sleep apnea    uses cpap  . Weight loss 06/20/2019  . Wrist pain 06/20/2019    Past Surgical History:  Procedure Laterality Date  . CATARACT EXTRACTION, BILATERAL    . LIPOMA EXCISION    . MOHS SURGERY    . TONSILLECTOMY    . TOTAL HIP ARTHROPLASTY Right 02/22/2018   Procedure: RIGHT TOTAL HIP ARTHROPLASTY ANTERIOR APPROACH;  Surgeon: Gaynelle Arabian, MD;  Location: [facility];  Service: Orthopedics;  Laterality: Right;    Family History  Problem Relation Age of Onset  . Hypertension Mother   . Uterine cancer Mother   . Hypertension Father   . Stomach cancer Neg Hx   . Rectal cancer Neg Hx   . Esophageal cancer Neg Hx   . Colon polyps Neg Hx   . Colon cancer Neg Hx     Social History   Tobacco Use  . Smoking status: Former Smoker    Packs/day: 0.50    Years: 50.00    Pack years: 25.00    Types: Cigars, Cigarettes    Quit date: 04/13/2017    Years since quitting: 3.0  . Smokeless tobacco: Never Used  . Tobacco comment: Pt smokes intermittently  Substance Use Topics  . Alcohol use: Yes    Alcohol/week: 7.0 standard drinks    Types: 7 Standard drinks or equivalent per week    Comment: [employer]-Daily  . Drug use: No    Prior to Admission medications   Medication Sig Start Date End Date Taking? Authorizing Provider  allopurinol (ZYLOPRIM) 300 MG tablet Take 300 mg by mouth daily.    [provider]  amLODipine (NORVASC) 5 MG tablet Take 5 mg by mouth daily.    [provider]  Ascorbic Acid (VITAMIN C PO) Take 1 g by mouth daily.    [provider]  aspirin 81 MG chewable tablet aspirin 81 mg chewable tablet  Chew 1 tablet every day by oral route.    [provider]  Cholecalciferol (VITAMIN D3 PO) Take 500 Units by mouth once a week. Monday, Wednesday and  Friday    [provider]  famotidine (PEPCID AC) 10 MG chewable tablet Chew 10 mg by mouth daily as needed for heartburn.    [provider]  irbesartan (AVAPRO) 300 MG tablet Take 300 mg by mouth daily.    [provider]  Melatonin 3 MG TABS Take 3 mg by mouth at bedtime as needed (sleep).    [provider]  mometasone (NASONEX) 50 MCG/ACT nasal spray Place 2 sprays into the nose daily. Patient taking differently: Place 2 sprays into the nose daily as needed (allergies).  10/07/15   Tanda Rockers, MD  Multiple Vitamins-Minerals (MULTIVITAMIN ADULT PO) multivitamin    [provider]  Nattokinase 100 MG CAPS Take 100 mg by mouth daily.    [provider]  Omega-3 Fatty Acids (FISH OIL PO) Take by mouth.    [provider]  pravastatin (PRAVACHOL) 20 MG tablet Take 20 mg by mouth daily.     [provider]  Quercetin 250 MG TABS Take 250 mg by mouth daily.    [provider]  tadalafil (CIALIS) 5 MG tablet Take by mouth.    [provider]  Tamsulosin HCl (FLOMAX) 0.4 MG CAPS Take 0.4 mg by mouth daily.     [provider]  zinc gluconate 50 MG tablet Take 50 mg by mouth daily.    [provider]    Allergies Metoprolol and Wellbutrin [bupropion]   REVIEW OF SYSTEMS  Negative except as noted here or in the History of Present Illness.   PHYSICAL EXAMINATION  Initial Vital Signs Blood pressure (!) 155/81, pulse 85, temperature 98 F (36.7 C), temperature source Oral, resp. rate 20, height 5' 7.5" (1.715 m), weight 68 kg, SpO2 98 %.  Examination General: Well-developed, well-nourished male in no acute distress; appearance consistent with age of record HENT: normocephalic; atraumatic Eyes: pupils equal, round and reactive to light; extraocular muscles intact; bilateral pseudophakia Neck: supple Heart: regular rate and rhythm; frequent premature atrial contractions (the earlier the premature beat the more palpable it is to the patient) Lungs: clear to auscultation bilaterally Abdomen: soft; nondistended; nontender; bowel sounds present Extremities: No deformity; full range of motion; pulses normal Neurologic: Awake, alert and oriented; motor function intact in all extremities and symmetric; no facial droop Skin: Warm and dry Psychiatric: Normal mood and affect   RESULTS  Summary of this visit's results, reviewed and interpreted by myself:   EKG Interpretation  Date/Time:  Thursday April 17 2020 21:38:20 EDT Ventricular Rate:  62 PR Interval:    QRS Duration: 90 QT Interval:  369 QTC Calculation: 375 R Axis:   62 Text Interpretation: Sinus rhythm Atrial premature complexes Abnormal R-wave progression, early transition Nonspecific T abnormalities, inferior leads Previously NSR Confirmed by Shanon Rosser [phone]) on  04/18/2020 1:12:14 AM      Laboratory Studies: Results for orders placed or performed during the hospital encounter of 04/17/20 (from the past 24 hour(s))  Basic metabolic panel     Status: Abnormal   Collection Time: 04/17/20 10:02 PM  Result Value Ref Range   Sodium 142 135 - 145 mmol/L   Potassium 4.0 3.5 - 5.1 mmol/L   Chloride 107 98 - 111 mmol/L   CO2 26 22 - 32 mmol/L   Glucose, Bld 126 (H) 70 - 99 mg/dL   BUN 14 8 - 23 mg/dL   Creatinine, Ser 0.93 0.61 - 1.24 mg/dL   Calcium 9.6 8.9 - 10.3 mg/dL   GFR  calc non Af Amer >60 >60 mL/min   GFR calc Af Amer >60 >60 mL/min   Anion gap 9 5 - 15  CBC     Status: None   Collection Time: 04/17/20 10:02 PM  Result Value Ref Range   WBC 10.0 4.0 - 10.5 K/uL   RBC 4.62 4.22 - 5.81 MIL/uL   Hemoglobin 14.1 13.0 - 17.0 g/dL   HCT 43.1 39.0 - 52.0 %   MCV 93.3 80.0 - 100.0 fL   MCH 30.5 26.0 - 34.0 pg   MCHC 32.7 30.0 - 36.0 g/dL   RDW 13.0 11.5 - 15.5 %   Platelets 237 150 - 400 K/uL   nRBC 0.0 0.0 - 0.2 %  Troponin I (High Sensitivity)     Status: None   Collection Time: 04/17/20 10:02 PM  Result Value Ref Range   Troponin I (High Sensitivity) 6 <18 ng/L  Troponin I (High Sensitivity)     Status: None   Collection Time: 04/18/20  1:00 AM  Result Value Ref Range   Troponin I (High Sensitivity) 6 <18 ng/L   Imaging Studies: DG Chest 2 View  Result Date: 04/17/2020 CLINICAL DATA:  Mid chest pain for 2 days. EXAM: CHEST - 2 VIEW COMPARISON:  Radiograph 06/24/2017. Chest CT 06/11/2019 FINDINGS: The cardiomediastinal contours are normal. Atherosclerosis of the aortic arch. Improved atelectasis from 2018. Pulmonary vasculature is normal. No consolidation, pleural effusion, or pneumothorax. No acute osseous abnormalities are seen. IMPRESSION: No acute chest findings or explanation for chest pain. Electronically Signed   By: Keith Rake M.D.   On: 04/17/2020 21:49    ED COURSE and MDM  Nursing notes, initial and subsequent vitals  signs, including pulse oximetry, reviewed and interpreted by myself.  Vitals:   04/17/20 2129 04/17/20 2133  BP:  (!) 155/81  Pulse:  85  Resp:  20  Temp:  98 F (36.7 C)  TempSrc:  Oral  SpO2:  98%  Weight: 68 kg   Height: 5' 7.5" (1.715 m)    Medications  sodium chloride flush (NS) 0.9 % injection 3 mL (0 mLs Intravenous Hold 04/18/20 0015)   2:18 AM HAART score is low and the patient is deemed appropriate for outpatient follow-up.  We will refer to cardiology.  He has a follow-up appointment scheduled next week with his primary care physician.  PROCEDURES  Procedures   ED DIAGNOSES     ICD-10-CM   1. Premature atrial contractions  I49.1        Kasir Hallenbeck, MD 04/18/20 0225

## 2020-04-25 ENCOUNTER — Encounter: Payer: Self-pay | Admitting: Cardiovascular Disease

## 2020-05-06 ENCOUNTER — Other Ambulatory Visit: Payer: Self-pay

## 2020-05-06 ENCOUNTER — Encounter: Payer: Self-pay | Admitting: Cardiovascular Disease

## 2020-05-06 ENCOUNTER — Ambulatory Visit: Payer: Medicare PPO | Admitting: Cardiovascular Disease

## 2020-05-06 VITALS — BP 140/76 | HR 79 | Temp 98.7°F | Ht 67.0 in | Wt 155.0 lb

## 2020-05-06 DIAGNOSIS — R072 Precordial pain: Secondary | ICD-10-CM | POA: Diagnosis not present

## 2020-05-06 DIAGNOSIS — I10 Essential (primary) hypertension: Secondary | ICD-10-CM

## 2020-05-06 DIAGNOSIS — F17210 Nicotine dependence, cigarettes, uncomplicated: Secondary | ICD-10-CM

## 2020-05-06 DIAGNOSIS — E782 Mixed hyperlipidemia: Secondary | ICD-10-CM

## 2020-05-06 DIAGNOSIS — E785 Hyperlipidemia, unspecified: Secondary | ICD-10-CM | POA: Insufficient documentation

## 2020-05-06 MED ORDER — METOPROLOL TARTRATE 100 MG PO TABS: ORAL_TABLET | ORAL | 0 refills | Status: DC

## 2020-05-06 NOTE — Assessment & Plan Note (Signed)
History of essential hypertension a blood pressure measured today at 140/76.  He is on Benicar.

## 2020-05-06 NOTE — Progress Notes (Signed)
05/06/2020 John Potts   1946/01/26  [id number]  Primary Physician Leanna Battles, MD Primary Cardiologist: Lorretta Harp MD Garret Reddish, [place], [place]  HPI:  John Potts is a 74 y.o. fit appearing married Caucasian male father of 1 child with no grandchildren who is a retired Arboriculturist.  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  He was referred by Dr. Sharlett Iles, his PCP, for evaluation of recent episode of chest pain.  His risk factors include smoking for last 50 years 4 to 5 cigarettes a day.  He also smoked a pipe and cigars.  History of hypertension and hyperlipidemia.  Not diabetic.  There is no family history for heart disease.  He is never had a heart attack or stroke.  He was recently seen in the emergency room 04/18/2020 for atypical chest pain.  Symptoms were negative.  His EKG showed no acute changes.  He was discharged home.  He said no recurrent symptoms.  Of note, is that he had a chest CT performed 6/15//20 that showed coronary calcification in the left coronary system.   Current Meds  Medication Sig  . allopurinol (ZYLOPRIM) 300 MG tablet Take 300 mg by mouth daily.  Marland Kitchen amLODipine (NORVASC) 5 MG tablet Take 5 mg by mouth daily.   Current Facility-Administered Medications for the 05/06/20 encounter (Office Visit) with Lorretta Harp, MD  Medication  . 0.9 %  sodium chloride infusion     Allergies  Allergen Reactions  . Metoprolol     Sluggish, Tired  . Wellbutrin [Bupropion]     Fatigue, increased BP     Social History   Socioeconomic History  . Marital status: Married    Spouse name: Not on file  . Number of children: Not on file  . Years of education: Not on file  . Highest education level: Not on file  Occupational History  . Occupation: retired  Tobacco Use  . Smoking status: Former Smoker    Packs/day: 0.50    Years: 50.00    Pack years: 25.00    Types: Cigars, Cigarettes    Quit date: 04/13/2017    Years since  quitting: 3.0  . Smokeless tobacco: Never Used  . Tobacco comment: Pt smokes intermittently  Substance and Sexual Activity  . Alcohol use: Yes    Alcohol/week: 7.0 standard drinks    Types: 7 Standard drinks or equivalent per week    Comment: [employer]-Daily  . Drug use: No  . Sexual activity: Not on file  Other Topics Concern  . Not on file  Social History Narrative   Drinks 2 cups of coffee in the morning.   Social Determinants of Health   Financial Resource Strain:   . Difficulty of Paying Living Expenses:   Food Insecurity:   . Worried About Charity fundraiser in the Last Year:   . Arboriculturist in the Last Year:   Transportation Needs:   . Film/video editor (Medical):   Marland Kitchen Lack of Transportation (Non-Medical):   Physical Activity:   . Days of Exercise per Week:   . Minutes of Exercise per Session:   Stress:   . Feeling of Stress :   Social Connections:   . Frequency of Communication with Friends and Family:   . Frequency of Social Gatherings with Friends and Family:   . Attends Religious Services:   . Active Member of Clubs or Organizations:   .  Attends Archivist Meetings:   Marland Kitchen Marital Status:   Intimate Partner Violence:   . Fear of Current or Ex-Partner:   . Emotionally Abused:   Marland Kitchen Physically Abused:   . Sexually Abused:      Review of Systems: General: negative for chills, fever, night sweats or weight changes.  Cardiovascular: negative for chest pain, dyspnea on exertion, edema, orthopnea, palpitations, paroxysmal nocturnal dyspnea or shortness of breath Dermatological: negative for rash Respiratory: negative for cough or wheezing Urologic: negative for hematuria Abdominal: negative for nausea, vomiting, diarrhea, bright red blood per rectum, melena, or hematemesis Neurologic: negative for visual changes, syncope, or dizziness All other systems reviewed and are otherwise negative except as noted above.    Blood pressure 140/76, pulse 79,  temperature 98.7 F (37.1 C), height 5\' 7"  (1.702 m), weight 155 lb (70.3 kg), SpO2 99 %.  General appearance: alert and no distress Neck: no adenopathy, no carotid bruit, no JVD, supple, symmetrical, trachea midline and thyroid not enlarged, symmetric, no tenderness/mass/nodules Lungs: clear to auscultation bilaterally Heart: regular rate and rhythm, S1, S2 normal, no murmur, click, rub or gallop Extremities: extremities normal, atraumatic, no cyanosis or edema Pulses: 2+ and symmetric Skin: Skin color, texture, turgor normal. No rashes or lesions Neurologic: Alert and oriented X 3, normal strength and tone. Normal symmetric reflexes. Normal coordination and gait  EKG sinus rhythm at 79 without ST or T wave changes.  I personally reviewed this EKG.  ASSESSMENT AND PLAN:   Chest pain, atypical Mr Vivier was recently seen in the emergency room 04/18/2020 with atypical left-sided chest pain.  His enzymes were negative and his EKG showed no acute changes.  He was discharged home.  He said no recurrent symptoms.  He does have risk factors including tobacco abuse, treated hypertension, hyperlipidemia as well as known coronary calcification by chest CT done 1 year ago.  I am going to obtain a coronary CTA to further evaluate  Cigarette smoker Tobacco abuse of 4-5 cigarettes a day for last 50 years intermittently smoking cigars as well.  Essential hypertension History of essential hypertension a blood pressure measured today at 140/76.  He is on Benicar.  Hyperlipidemia History of hyperlipidemia on Pravachol with recent blood work performed 04/18/2020 revealing total cholesterol 183, LDL 70 and HDL of 96.      Lorretta Harp MD FACP,FACC,FAHA, [facility] 05/06/2020 10:02 AM

## 2020-05-06 NOTE — Patient Instructions (Signed)
Medication Instructions:  NO CHANGE  *If you need a refill on your cardiac medications before your next appointment, please call your pharmacy*   Lab Work: If you have labs (blood work) drawn today and your tests are completely normal, you will receive your results only by: Marland Kitchen MyChart Message (if you have MyChart) OR . A paper copy in the mail If you have any lab test that is abnormal or we need to change your treatment, we will call you to review the results.   Testing/Procedures: Your cardiac CT will be scheduled at one of the below locations:   [facility] [address] [place], [place] [address] [phone]  If scheduled at [facility], please arrive at the [facility] main entrance of [facility] 30 minutes prior to test start time. Proceed to the [facility] Radiology Department (first floor) to check-in and test prep.  Please follow these instructions carefully (unless otherwise directed):  Hold all erectile dysfunction medications at least 3 days (72 hrs) prior to test.  On the Night Before the Test: . Be sure to Drink plenty of water. . Do not consume any caffeinated/decaffeinated beverages or chocolate 12 hours prior to your test. . Do not take any antihistamines 12 hours prior to your test. . If you take Metformin do not take 24 hours prior to test.  On the Day of the Test: . Drink plenty of water. Do not drink any water within one hour of the test. . Do not eat any food 4 hours prior to the test. . You may take your regular medications prior to the test.  . Take metoprolol (Lopressor) 100 MG two hours prior to test. . HOLD Furosemide/Hydrochlorothiazide morning of the test. . FEMALES- please wear underwire-free bra if available       After the Test: . Drink plenty of water. . After receiving IV contrast, you may experience a mild flushed feeling. This is normal. . On occasion, you may experience a mild rash up to 24 hours after  the test. This is not dangerous. If this occurs, you can take Benadryl 25 mg and increase your fluid intake. . If you experience trouble breathing, this can be serious. If it is severe call 911 IMMEDIATELY. If it is mild, please call our office. . If you take any of these medications: Glipizide/Metformin, Avandament, Glucavance, please do not take 48 hours after completing test unless otherwise instructed.   Once we have confirmed authorization from your insurance company, we will call you to set up a date and time for your test.   For non-scheduling related questions, please contact the cardiac imaging nurse navigator should you have any questions/concerns: Marchia Bond, RN Navigator Cardiac Imaging [facility] [phone] office  For scheduling needs, including cancellations and rescheduling, please call [phone].      Follow-Up: At [facility], you and your health needs are our priority.  As part of our continuing mission to provide you with exceptional heart care, we have created designated Provider Care Teams.  These Care Teams include your primary Cardiologist (physician) and Advanced Practice Providers (APPs -  Physician Assistants and Nurse Practitioners) who all work together to provide you with the care you need, when you need it.  We recommend signing up for the patient portal called "MyChart".  Sign up information is provided on this After Visit Summary.  MyChart is used to connect with patients for Virtual Visits (Telemedicine).  Patients are  able to view lab/test results, encounter notes, upcoming appointments, etc.  Non-urgent messages can be sent to your provider as well.   To learn more about what you can do with MyChart, go to [email or web address].    Your next appointment:    AS NEEDED

## 2020-05-06 NOTE — Assessment & Plan Note (Signed)
History of hyperlipidemia on Pravachol with recent blood work performed 04/18/2020 revealing total cholesterol 183, LDL 70 and HDL of 96.

## 2020-05-06 NOTE — Assessment & Plan Note (Signed)
John Potts was recently seen in the emergency room 04/18/2020 with atypical left-sided chest pain.  His enzymes were negative and his EKG showed no acute changes.  He was discharged home.  He said no recurrent symptoms.  He does have risk factors including tobacco abuse, treated hypertension, hyperlipidemia as well as known coronary calcification by chest CT done 1 year ago.  I am going to obtain a coronary CTA to further evaluate

## 2020-05-06 NOTE — Assessment & Plan Note (Signed)
Tobacco abuse of 4-5 cigarettes a day for last 50 years intermittently smoking cigars as well.

## 2020-05-21 ENCOUNTER — Telehealth: Payer: Self-pay | Admitting: Cardiovascular Disease

## 2020-05-21 ENCOUNTER — Telehealth (HOSPITAL_COMMUNITY): Payer: Self-pay | Admitting: *Deleted

## 2020-05-21 NOTE — Telephone Encounter (Signed)

## 2020-05-21 NOTE — Telephone Encounter (Signed)
   Pt said he needs instructions for his upcoming CT on 05/23/20.

## 2020-05-21 NOTE — Telephone Encounter (Signed)
I spoke to the patient and reviewed CT instructions scheduled on 5/28.  He verbalized understanding.

## 2020-05-23 ENCOUNTER — Ambulatory Visit (HOSPITAL_COMMUNITY)
Admission: RE | Admit: 2020-05-23 | Discharge: 2020-05-23 | Disposition: A | Discharge status: Home / Self Care | Payer: Medicare PPO | Source: Ambulatory Visit | Attending: Cardiovascular Disease | Admitting: Cardiovascular Disease

## 2020-05-23 ENCOUNTER — Other Ambulatory Visit: Payer: Self-pay

## 2020-05-23 DIAGNOSIS — R072 Precordial pain: Secondary | ICD-10-CM

## 2020-05-23 MED ORDER — NITROGLYCERIN 0.4 MG SL SUBL
0.8000 mg | SUBLINGUAL_TABLET | Freq: Once | SUBLINGUAL | Status: AC
  Administered 2020-05-23: 0.8 mg via SUBLINGUAL

## 2020-05-23 MED ORDER — NITROGLYCERIN 0.4 MG SL SUBL
SUBLINGUAL_TABLET | SUBLINGUAL | Status: AC
  Filled 2020-05-23: qty 2

## 2020-05-23 MED ORDER — IOHEXOL 350 MG/ML SOLN
80.0000 mL | Freq: Once | INTRAVENOUS | Status: AC | PRN
  Administered 2020-05-23: 80 mL via INTRAVENOUS

## 2020-05-23 NOTE — Progress Notes (Signed)
CT scan completed. Tolerated well. D/C home ambulatory, awake and alert. In no distress. 

## 2020-05-27 ENCOUNTER — Telehealth: Payer: Self-pay | Admitting: Cardiovascular Disease

## 2020-05-27 NOTE — Telephone Encounter (Signed)
Spoke with patient and reviewed results. Patient verbalized understanding.

## 2020-05-27 NOTE — Telephone Encounter (Signed)
New message   Patient is returning call for CT results. Please call.

## 2020-07-21 DIAGNOSIS — G4733 Obstructive sleep apnea (adult) (pediatric): Secondary | ICD-10-CM | POA: Diagnosis not present

## 2020-09-05 DIAGNOSIS — N5201 Erectile dysfunction due to arterial insufficiency: Secondary | ICD-10-CM | POA: Diagnosis not present

## 2020-09-05 DIAGNOSIS — R350 Frequency of micturition: Secondary | ICD-10-CM | POA: Diagnosis not present

## 2020-09-05 DIAGNOSIS — N401 Enlarged prostate with lower urinary tract symptoms: Secondary | ICD-10-CM | POA: Diagnosis not present

## 2020-09-05 DIAGNOSIS — R972 Elevated prostate specific antigen [PSA]: Secondary | ICD-10-CM | POA: Diagnosis not present

## 2020-09-08 IMAGING — CT CT HEART MORP W/ CTA COR W/ SCORE W/ CA W/CM &/OR W/O CM
4 of 7 series · 8 of 20 positions shown, 9 images · IV contrast (APPLIED)
Comparison: CT of the chest on 06/11/2019
COMPARISON: CT of the chest on 06/11/2019

Addendum:
EXAM:
OVER-READ INTERPRETATION  CT CHEST

The following report is an over-read performed by radiologist Dr.
Eian Matusin [REDACTED] on 05/23/2020. This
over-read does not include interpretation of cardiac or coronary
anatomy or pathology. The coronary CTA interpretation by the
cardiologist is attached.
HISTORY: Chest pain/anginal equiv, 10yr CHD risk 10-20%, not treadmill
candidate
Cardiac/Coronary CT
TECHNIQUE: The patient was scanned on a Siemens Force scanner.
PROTOCOL: A 120 kV prospective scan was triggered in the descending thoracic
aorta at 111 HU's. Axial non-contrast 3 mm slices were carried out
through the heart. The data set was analyzed on a dedicated work
station and scored using the Agatson method. Gantry rotation speed
was 250 msecs and collimation was 0.6 mm. Beta blockade and 0.8 mg
of sl NTG was given. The 3D data set was reconstructed in 5%
intervals of 35-75% of the R-R cycle. Diastolic phases were analyzed
on a dedicated work station using MPR, MIP and VRT modes. The
patient received 80mL OMNIPAQUE IOHEXOL 350 MG/ML SOLN of contrast.

[Series 6: best diast 75 % · axial · 0.39mm/px · z∈[+1144,+1187]mm · 2 of 321 slices shown, 3 images]
[im 107/321  vessel]
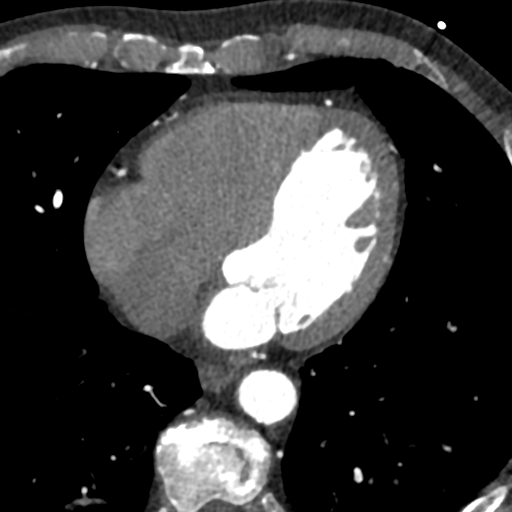
[im 107/321  lung]
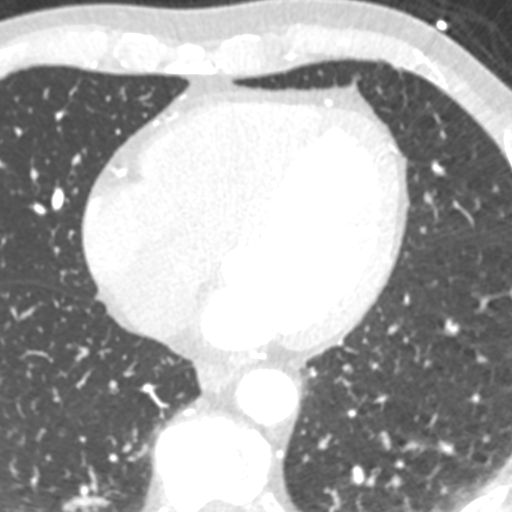
[im 214/321  vessel]
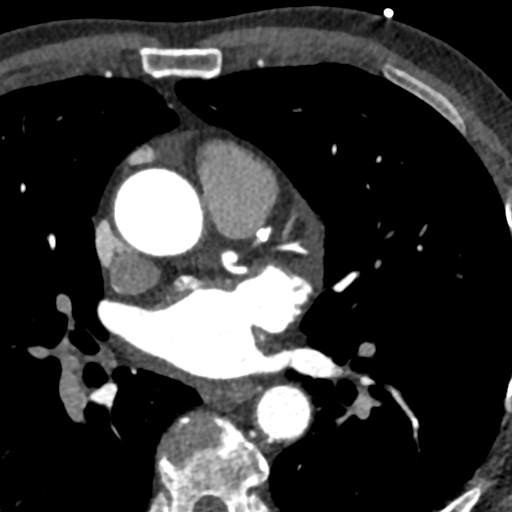

[Series 7: best syst 41 % · axial · 0.39mm/px · z∈[+1144,+1187]mm · 2 of 321 slices shown]
[im 107/321  vessel]
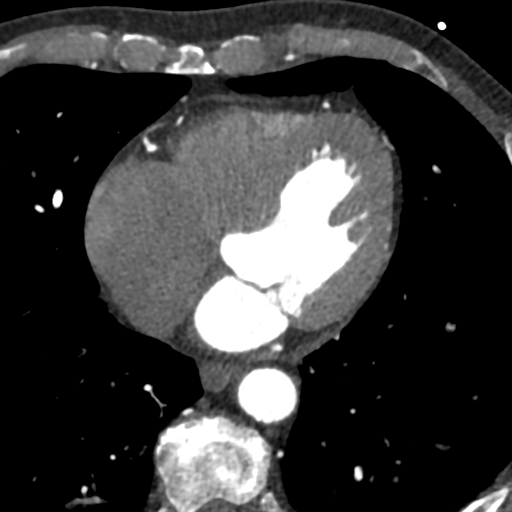
[im 214/321  vessel]
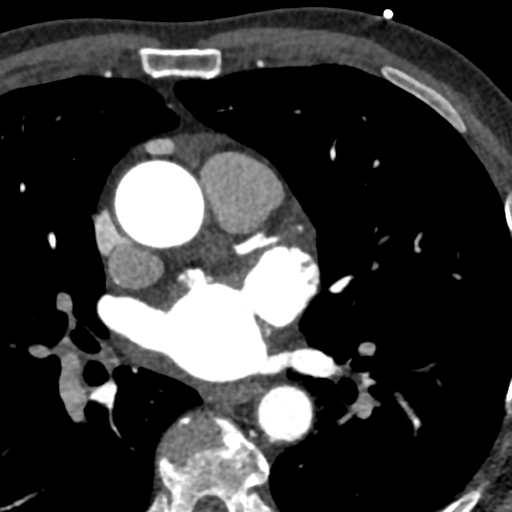

[Series 8: ts diast sharp 75 % · axial · 0.39mm/px · z∈[+1144,+1187]mm · 2 of 321 slices shown]
[im 107/321  lung]
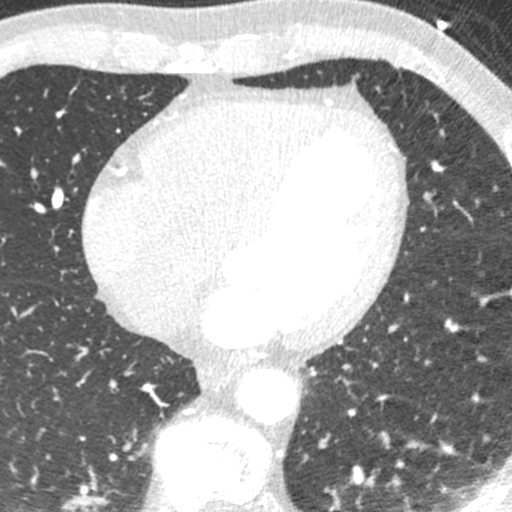
[im 214/321  lung]
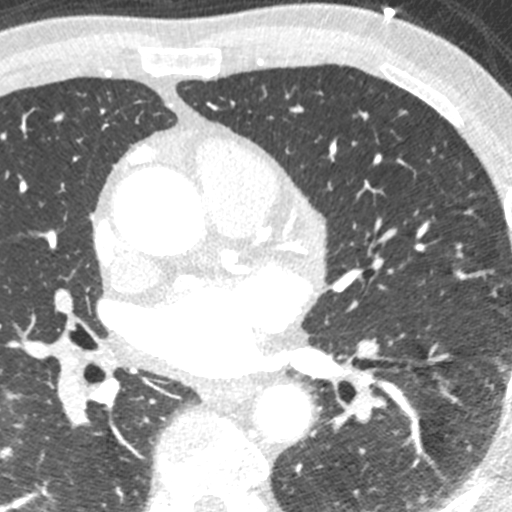

[Series 9: ts syst sharp 41 % · axial · 0.39mm/px · z∈[+1144,+1187]mm · 2 of 321 slices shown]
[im 107/321  lung]
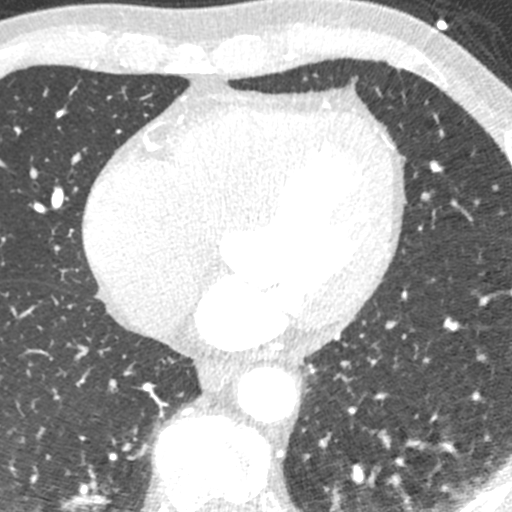
[im 214/321  lung]
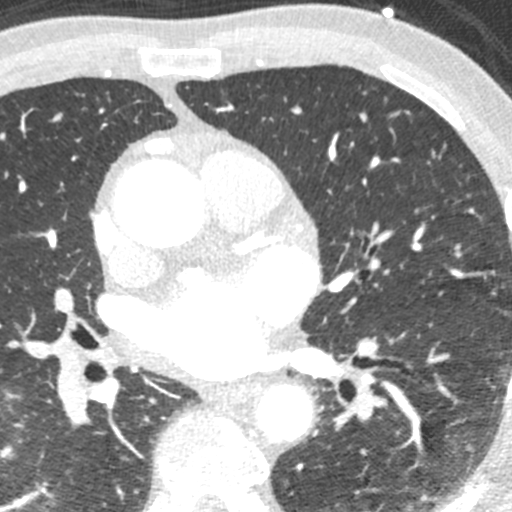

[8 of 20 positions shown; findings below may reference images not displayed]

FINDINGS: Vascular: No incidental findings.

Mediastinum/Nodes: No visualized enlarged lymph nodes or abnormal
masses.

Lungs/Pleura: Visualized lungs show no evidence of pulmonary edema,
consolidation, pneumothorax, nodule or pleural fluid.

Upper Abdomen: No acute abnormality.

Musculoskeletal: No chest wall mass or suspicious bone lesions
identified.
IMPRESSION: No significant incidental findings.
FINDINGS: Coronary calcium score: The patient's coronary artery calcium score
is 87, which places the patient in the 37 percentile.

Coronary arteries: Normal coronary origins.  Right dominance.

Right Coronary Artery: Normal caliber vessel, gives rise to PDA.
Focal calcified plaque in proximal RCA with 1-24% stenosis.

Left Main Coronary Artery: Normal caliber vessel. No significant
plaque or stenosis.

Left Anterior Descending Coronary Artery: Normal caliber vessel.
Focal calcified plaque in proximal LAD with 25-49% stenosis.
Noncalcified plaque in mid LAD with 1-24% stenosis. LAD gives rise
to 1 large and 4 small diagonal branches.

Left Circumflex Artery: Normal caliber vessel. No significant plaque
or stenosis. Gives rise to 1 small OM branch.

Aorta: Normal size, 32 mm at the mid ascending aorta (level of the
PA bifurcation) measured double oblique. Trivial calcifications. No
dissection.

Aortic Valve: No calcifications. Trileaflet.

Other findings:

Normal pulmonary vein drainage into the left atrium.

Normal left atrial appendage without a thrombus.

Normal size of the pulmonary artery.
IMPRESSION: 1.  Mild nonobstructive CAD, CADRADS = 2.

2. Coronary calcium score of 87. This was 37th percentile for age
and sex matched control.

3. Normal coronary origin with right dominance.

*** End of Addendum ***
EXAM:
OVER-READ INTERPRETATION  CT CHEST

The following report is an over-read performed by radiologist Dr.
Eian Matusin [REDACTED] on 05/23/2020. This
over-read does not include interpretation of cardiac or coronary
anatomy or pathology. The coronary CTA interpretation by the
cardiologist is attached.
FINDINGS: Vascular: No incidental findings.

Mediastinum/Nodes: No visualized enlarged lymph nodes or abnormal
masses.

Lungs/Pleura: Visualized lungs show no evidence of pulmonary edema,
consolidation, pneumothorax, nodule or pleural fluid.

Upper Abdomen: No acute abnormality.

Musculoskeletal: No chest wall mass or suspicious bone lesions
identified.
IMPRESSION: No significant incidental findings.

## 2020-10-20 ENCOUNTER — Telehealth: Payer: Self-pay | Admitting: Adult Health

## 2020-10-20 DIAGNOSIS — G4733 Obstructive sleep apnea (adult) (pediatric): Secondary | ICD-10-CM | POA: Diagnosis not present

## 2020-10-20 NOTE — Telephone Encounter (Signed)
FYI pt was scheduled with Dr Brett Fairy because she had an available slot before both Megan.NP and Amy,NP.  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

## 2020-10-20 NOTE — Telephone Encounter (Signed)
Noted  

## 2020-10-27 DIAGNOSIS — Z96641 Presence of right artificial hip joint: Secondary | ICD-10-CM | POA: Diagnosis not present

## 2020-10-27 DIAGNOSIS — M25552 Pain in left hip: Secondary | ICD-10-CM | POA: Diagnosis not present

## 2020-10-27 DIAGNOSIS — M25562 Pain in left knee: Secondary | ICD-10-CM | POA: Diagnosis not present

## 2020-10-30 DIAGNOSIS — M25552 Pain in left hip: Secondary | ICD-10-CM | POA: Diagnosis not present

## 2020-11-10 ENCOUNTER — Ambulatory Visit: Payer: Medicare Other | Admitting: Adult Health

## 2020-12-08 DIAGNOSIS — H35372 Puckering of macula, left eye: Secondary | ICD-10-CM | POA: Diagnosis not present

## 2020-12-08 DIAGNOSIS — H524 Presbyopia: Secondary | ICD-10-CM | POA: Diagnosis not present

## 2020-12-08 DIAGNOSIS — H18593 Other hereditary corneal dystrophies, bilateral: Secondary | ICD-10-CM | POA: Diagnosis not present

## 2020-12-08 DIAGNOSIS — H43812 Vitreous degeneration, left eye: Secondary | ICD-10-CM | POA: Diagnosis not present

## 2020-12-09 DIAGNOSIS — L738 Other specified follicular disorders: Secondary | ICD-10-CM | POA: Diagnosis not present

## 2020-12-09 DIAGNOSIS — L905 Scar conditions and fibrosis of skin: Secondary | ICD-10-CM | POA: Diagnosis not present

## 2020-12-09 DIAGNOSIS — L819 Disorder of pigmentation, unspecified: Secondary | ICD-10-CM | POA: Diagnosis not present

## 2020-12-09 DIAGNOSIS — D1801 Hemangioma of skin and subcutaneous tissue: Secondary | ICD-10-CM | POA: Diagnosis not present

## 2020-12-09 DIAGNOSIS — L821 Other seborrheic keratosis: Secondary | ICD-10-CM | POA: Diagnosis not present

## 2020-12-09 DIAGNOSIS — Z85828 Personal history of other malignant neoplasm of skin: Secondary | ICD-10-CM | POA: Diagnosis not present

## 2020-12-09 DIAGNOSIS — L814 Other melanin hyperpigmentation: Secondary | ICD-10-CM | POA: Diagnosis not present

## 2020-12-10 ENCOUNTER — Encounter: Payer: Self-pay | Admitting: Neurology

## 2020-12-10 ENCOUNTER — Ambulatory Visit: Payer: Medicare Other | Admitting: Neurology

## 2020-12-10 VITALS — BP 148/86 | HR 94 | Ht 67.5 in | Wt 160.0 lb

## 2020-12-10 DIAGNOSIS — J322 Chronic ethmoidal sinusitis: Secondary | ICD-10-CM

## 2020-12-10 DIAGNOSIS — Z9989 Dependence on other enabling machines and devices: Secondary | ICD-10-CM | POA: Diagnosis not present

## 2020-12-10 DIAGNOSIS — G4733 Obstructive sleep apnea (adult) (pediatric): Secondary | ICD-10-CM

## 2020-12-10 DIAGNOSIS — R5382 Chronic fatigue, unspecified: Secondary | ICD-10-CM | POA: Diagnosis not present

## 2020-12-10 DIAGNOSIS — R918 Other nonspecific abnormal finding of lung field: Secondary | ICD-10-CM

## 2020-12-10 DIAGNOSIS — G4734 Idiopathic sleep related nonobstructive alveolar hypoventilation: Secondary | ICD-10-CM | POA: Diagnosis not present

## 2020-12-10 NOTE — Patient Instructions (Signed)

## 2020-12-10 NOTE — Progress Notes (Signed)
SLEEP MEDICINE CLINIC   Provider:  Larey Seat, M D  Referring Provider: Leanna Battles, MD Primary Care Physician:  Leanna Battles, MD  Chief Complaint  Patient presents with  . Follow-up    Pt alone, rm 11. Presents today for yearly follow up. States overall things are well. He thinks should be eligible for a new machine. He states that with the shortage and the length of time its taking to get a machine he doesn't want to be stuck without a new one. If he is due he would like to go ahead and get started. Set up 08/25/2015. He is due. D[facility]    Interval history :  John Potts is a 74 y.o. male, and was seen here originally upon a consultation -referral from Dr. Philip Aspen. 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  His daughter often functions as his alarm in the morning it.  He quit smoking about 4 years ago for. He has been followed here since 2009 on and off but his current CPAP machine which he uses for the treatment of obstructive sleep apnea is now over 7 years old.  He is well aware of the difficulties with the supply chain at this time any electronic devices and would like to get the new order on the books.  I have a compliance report here for his current machine and over the last 30 days he was 100% compliant user by days and by hours of daily use.  Average use at time is 9 hours 23 minutes.  His CPAP is set to a pressure of 15 cmH2O this 2 cm EPR, his residual AHI is 5.0 and the vast majority of residual apneas is obstructive in nature.  He seems not to have any major air leak on the contrary his 95th percentile air leak data showed 0.5 L/min.  He did have some condensation water problems last time I spoke with him and he has reduced his humidifier or switch to [place] and since then that problem has much improved.  His last baseline sleep study would be over 5 years ago.  His fatigue severity  scale was today endorsed at 15 out of 63 points his Epworth sleepiness score at 4 out of 24 points and he did endorse only 1 point on the geriatric depression score. His father passed in late 2019 and he is renovating his paternal home and will try to sell it. He is busy with projects.      HPI:  I see John Potts today for the first time after about 7 years and he was referred originally in early 2009 for evaluation of possible sleep apnea and  diagnosed with sleep apnea and titrated to CPAP 9 cm water - His titration dated from 08-10-2008. John Potts now reports that he feels tired and fatigued all the time not necessarily sleepy but that there is a significant decrease in his energy.  His baseline study he had an apnea-hypotony index of 25.4 at the body mass index of 26 and a neck circumference of 15.25 inches. He had endorsed the Epworth sleepiness score in 2009 at only 2 points. John Potts retired in 2013, he goes to bed at 9.30 PM.  Usually falls asleep promptly. He likes to watch some TV, usually not in bed. His Dog, Mr Frankey Potts, wakes him at 6 AM.  He has no nocturia ,  no witnessed  Snoring on CPAP. He reports to dream, but not to act out, is no report of sleepwalking, sleep talking, sleep eating. He sometimes a takes a nap in PM , and his wife noted him to snore. He does consume some coffee but only in the morning. 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 He feels fatigued and non restored a couple of mornings, these afternoons are the nap times. He sleeps not longer than 45 minutes, he feels not refreshed  if he naps without CPAP. No history of cervical, ENT or TBI .  Interval history from 09-29-15,  John Potts underwent a home sleep test or out of center test which identified a mild apnea with an AHI of 12.5. The lowest oxygen saturation that night was 81% but he accumulated 331 minutes of desaturations between 60 and 90%. Remarkably his  heart rate was stable throughout the night which could indicate an artifact in oxygen levels as measured by the home sleep test.  For this reason an overnight pulse oximetry was performed and the patient underwent this test on 08/30/2015. Here again the SPO2 state at or below 89% for 2 hours and 59 minutes but he seemed not to have a lot of pulse variation. However,  I would consider this now to be a correct reading and for that reason would like the patient to use CPAP.  A dental device or an ENT surgery are not helpful and hypoxemia correction.  The patient's download from his CPAP machine was obtained today and shows 100% of the last 30 days use over 4 consecutive hours.  He even used the machine 8 hours and 21 minutes on average. He is using auto set, between 5 and 15 cm water pressure with 3 cm expiratory pressure relief. His AHI was 0.9 with is an excellent resolution. He has reached a 95% pressure of 9 cm .  he reports that he wakes more often up at night now, he looks at the clock. It is usually at 2 and  4 AM . He had some gout and increased the fluid intake, thus having 1 nocturia. No pain.  He rarely naps in daytime, when it happens its after lunch. He has no identified primary lung disease.  I need now to further evaluate him for the low oxygen levels while on CPAP, while he is happy with its use and he likes the interface. I will refer this nice gentleman to Dr. Halford Chessman or Dr. Lamonte Sakai.   Interval history from 06/14/2016. John Potts is here today he has a fairly new CPAP and was re-titrated in a CPAP titration on 04/22/2016. The study revealed an optimal pressure at about 10 cm water, at lower pressures there was no longer apnea noted but hypoxia especially during REM sleep. Once the patient reached 10 cm water pressure setting his oxygen nadir rose to 89%. He did not qualify for oxygen. He was using 2 different masks during this test 1 was a nasal pillow the F10 and another one was a full face mask by  [employer]. Both were used with heated humidity  The compliance report for his newly set CPAP at 10 cm water pressure with 3 cm EPR. 100% compliance over the last 30 days with an average user time of 8 hours and 6 minutes. The residual AHI was 2.1. No adjustments have to be made. With the compliance of 100% he cannot  improve. His Epworth sleepiness score was today endorsed at 6 points and the fatigue severity score was endorsed at 28 points, the geriatric depression score was endorsed at 3 out of 15 points. No clinical depression evident.   History from 09/28/2016, He still feels very tired but is a very compliant CPAP user. He is starting to take naps again in the afternoon as a duration of 30-40 minutes. He has no nocturia. He drinks bourbon each night.  He wonders if a slight bump up in pressure could help him. The patient has been using his CPAP 400% of the last 30 days over 4 hours with an average user time of 8 hours 35 minutes, the machine is set at 10 cm water pressure with 3 cm EPR, the residual AHI is ideal at 2.3 and he has very little air leak. What I'm going to offer him is to set the pressure at 11 cm and reduce the EPR to 2 cm water his Epworth sleepiness score and his fatigue severity score is still below average.  Review of Systems: Out of a complete 14 system review, the patient complains of only the following symptoms, and all other reviewed systems are negative.   Epworth score 6 , Fatigue severity score 27  , geriatric  depression score 3 , Sinusitis. Rhinophyma.   Social History   Socioeconomic History  . Marital status: Married    Spouse name: Not on file  . Number of children: Not on file  . Years of education: Not on file  . Highest education level: Not on file  Occupational History  . Occupation: retired  Tobacco Use  . Smoking status: Former Smoker    Packs/day: 0.50    Years: 50.00    Pack years: 25.00    Types: Cigars, Cigarettes    Quit date: 04/13/2017     Years since quitting: 3.6  . Smokeless tobacco: Never Used  . Tobacco comment: Pt smokes intermittently  Vaping Use  . Vaping Use: Former  Substance and Sexual Activity  . Alcohol use: Yes    Alcohol/week: 7.0 standard drinks    Types: 7 Standard drinks or equivalent per week    Comment: [employer]-Daily  . Drug use: No  . Sexual activity: Not on file  Other Topics Concern  . Not on file  Social History Narrative   Drinks 2 cups of coffee in the morning.   Social Determinants of Health   Financial Resource Strain: Not on file  Food Insecurity: Not on file  Transportation Needs: Not on file  Physical Activity: Not on file  Stress: Not on file  Social Connections: Not on file  Intimate Partner Violence: Not on file    Family History  Problem Relation Age of Onset  . Hypertension Mother   . Uterine cancer Mother   . Hypertension Father   . Stomach cancer Neg Hx   . Rectal cancer Neg Hx   . Esophageal cancer Neg Hx   . Colon polyps Neg Hx   . Colon cancer Neg Hx     Past Medical History:  Diagnosis Date  . Allergy   . Arthritis   . Cold intolerance 06/20/2019  . Complication of anesthesia    Difficult urinating  . Elevated PSA 07/23/2019  . Erectile dysfunction   . Gout   . Hyperlipidemia   . Hypertension   . Kidney stone   . Knee pain   . Lung nodules 06/20/2019  . OSA on CPAP   .  Sleep apnea    uses cpap  . Weight loss 06/20/2019  . Wrist pain 06/20/2019    Past Surgical History:  Procedure Laterality Date  . CATARACT EXTRACTION, BILATERAL    . LIPOMA EXCISION    . MOHS SURGERY    . TONSILLECTOMY    . TOTAL HIP ARTHROPLASTY Right 02/22/2018   Procedure: RIGHT TOTAL HIP ARTHROPLASTY ANTERIOR APPROACH;  Surgeon: Gaynelle Arabian, MD;  Location: [facility];  Service: Orthopedics;  Laterality: Right;    Current Outpatient Medications  Medication Sig Dispense Refill  . acetaminophen (TYLENOL) 650 MG CR tablet Take 1,300 mg by mouth every morning.    Marland Kitchen allopurinol  (ZYLOPRIM) 300 MG tablet Take 300 mg by mouth daily.    Marland Kitchen amLODipine (NORVASC) 5 MG tablet Take 5 mg by mouth daily.    . famotidine (PEPCID AC) 10 MG chewable tablet Chew 10 mg by mouth daily as needed for heartburn.    . Melatonin 3 MG TABS Take 3 mg by mouth at bedtime as needed (sleep).    . metoprolol tartrate (LOPRESSOR) 100 MG tablet TAKE 2 HOURS PRIOR TO CT SCAN 1 tablet 0  . mometasone (NASONEX) 50 MCG/ACT nasal spray Place 2 sprays into the nose daily. (Patient taking differently: Place 2 sprays into the nose daily as needed (allergies).) 17 g 11  . Multiple Vitamins-Minerals (MULTIVITAMIN ADULT PO) multivitamin    . olmesartan (BENICAR) 40 MG tablet     . Omega-3 Fatty Acids (FISH OIL PO) Take by mouth.    . pravastatin (PRAVACHOL) 20 MG tablet Take 20 mg by mouth daily.    . tadalafil (CIALIS) 5 MG tablet Take by mouth.    . Tamsulosin HCl (FLOMAX) 0.4 MG CAPS Take 0.4 mg by mouth daily.      No current facility-administered medications for this visit.    Allergies as of 12/10/2020 - Review Complete 12/10/2020  Allergen Reaction Noted  . Metoprolol  02/17/2018  . Wellbutrin [bupropion]  05/13/2017    Vitals: BP (!) 148/86   Pulse 94   Ht 5' 7.5" (1.715 m)   Wt 160 lb (72.6 kg)   BMI 24.69 kg/m  Last Weight:  Wt Readings from Last 1 Encounters:  12/10/20 160 lb (72.6 kg)       Last Height:   Ht Readings from Last 1 Encounters:  12/10/20 5' 7.5" (1.715 m)    Physical exam:  General: The patient is awake, alert and appears not in acute distress. The patient is well groomed. Head: Normocephalic, atraumatic. Neck is supple. Mallampati2 , left deviated uvula, ,  neck circumference: 16 . Nasal airflow unrestricted- but he reports post nasal drip at night.  , TMJ is not  evident . Retrognathia is seen.  Cardiovascular:  Regular rate and rhythm , without  murmurs or carotid bruit, and without distended neck veins. Respiratory: Lungs are clear to auscultation. Skin:   Without evidence of edema, or rash Trunk: BMI is not  elevated and patient  has normal posture.  Neurologic exam : The patient is awake and alert, oriented to place and time.   Memory subjective  described as intact. There is a normal attention span & concentration ability. Speech is fluent without  dysarthria, dysphonia or aphasia. Mood and affect are appropriate. Cranial nerves: Pupils are equal and briskly reactive to light.  Visual fields by finger perimetry are intact. Hearing to finger rub intact.  Facial sensation intact to fine touch. Facial motor strength is symmetric and tongue  and uvula move midline. Motor exam: Normal tone, muscle bulk and symmetric ,strength in all extremities.  Assessment: 20 minute visit. After physical and neurologic examination, review of laboratory studies, imaging, neurophysiology testing and pre-existing records,  25 minute assessment is OSA ,  Required CPAP due to hypoxemia. Hypoxemia persisted on CPAP at 7 cm water , but was much improved on 10 cm water. FFM .  I will follow John Potts once a year for his CPAP compliance, now 100%. His machine is now 73 years old and needs replacement. Smoking cessation has been achieved-  Oxygen seems not needed. FFM is preferred .  Will repeat a HST for a new  baseline and order new CPAP based on the results, with auto titration feature.    [facility]- was [facility] patient .  We will call him when a HST is ready , he would like the test after January 6th.     Asencion Partridge Stephana Morell MD  12/10/2020   Cc; Leanna Battles, MD

## 2020-12-12 DIAGNOSIS — M1612 Unilateral primary osteoarthritis, left hip: Secondary | ICD-10-CM | POA: Diagnosis not present

## 2020-12-15 ENCOUNTER — Telehealth: Payer: Self-pay

## 2020-12-15 NOTE — Telephone Encounter (Signed)
LVM for pt to call me back to schedule sleep study  

## 2021-01-21 ENCOUNTER — Ambulatory Visit (INDEPENDENT_AMBULATORY_CARE_PROVIDER_SITE_OTHER): Payer: Medicare PPO | Admitting: Neurology

## 2021-01-21 DIAGNOSIS — R918 Other nonspecific abnormal finding of lung field: Secondary | ICD-10-CM

## 2021-01-21 DIAGNOSIS — G4734 Idiopathic sleep related nonobstructive alveolar hypoventilation: Secondary | ICD-10-CM

## 2021-01-21 DIAGNOSIS — J322 Chronic ethmoidal sinusitis: Secondary | ICD-10-CM

## 2021-01-21 DIAGNOSIS — Z9989 Dependence on other enabling machines and devices: Secondary | ICD-10-CM

## 2021-01-21 DIAGNOSIS — G4733 Obstructive sleep apnea (adult) (pediatric): Secondary | ICD-10-CM

## 2021-01-22 NOTE — Progress Notes (Signed)
   [facility] TEST ( HST by [facility])  STUDY DATE: 01/21/21  DOB: 17-Oct-1946  MRN: [id number]  ORDERING CLINICIAN: Larey Seat, MD   REFERRING CLINICIAN: Leanna Battles, MD   CLINICAL INFORMATION/HISTORY: John Potts is a 75 y.o. male, and was seen here originally upon a consultation -referral from Dr. Philip Aspen. 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  He quit smoking about 4 years ago for. He has been followed here since 2009 on and off but his current CPAP machine, which he uses for the treatment of obstructive sleep apnea, is now over 58 years old.  He is well aware of the difficulties with the supply chain at this time any electronic devices and would like to get the new order on the books.  I have a compliance report here for his current machine and over the last 30 days he was 100% compliant user by days and by hours of daily use.  Average use at time is 9 hours 23 minutes.   His CPAP is set to a pressure of 15 cmH2O this 2 cm EPR, his residual AHI is 5.0 and the vast majority of residual apneas is obstructive in nature.  He seems not to have any major air leak on the contrary his 95th percentile air leak data showed 0.5 L/min.  He did have some condensation water problems last time I spoke with him and he has reduced his humidifier settings and since then that problem has much improved.  His last baseline sleep study would be over 5 years ago.  Epworth sleepiness score: 6/24.  BMI: 24.4 kg/m  Neck Circumference: 16 "  FINDINGS:   Total Record Time (hours, min): 7 h 34 min  Total Sleep Time (hours, min):  6 h 16 min   Percent REM (%):    26.67 %   Calculated pAHI (per hour): 16.6       REM pAHI: 15.8     NREM pAHI: 17.0 Supine AHI:26.6   Oxygen Saturation (%) Mean: 92  Minimum oxygen saturation (%):        83   O2 Saturation Range (%): 83-98  O2Saturation  (minutes) <=88%: 4.9 min   Pulse Mean (bpm):    76  Pulse Range (63-101)   IMPRESSION: This HST confirmed the presence of mild OSA (obstructive sleep apnea) without REM dependence . There was a strong correlation of apneas with the supine sleep position. Normal pulse range and no prolonged hypoxia were also noted.    RECOMMENDATION: continuation of CPAP therapy is needed. The patient will need a new PAP machine , with a setting of 7-17 cm water, 2 cm EPR and heated humidification. He can chose the mask he likes best.  He can continue to use nasal spray as needed.    INTERPRETING PHYSICIAN:  Larey Seat, MD  [facility] and [facility] Sleep Board certified by The [employer] of Sleep Medicine and Diplomate of the [employer] of Sleep Medicine. Board certified In Neurology through the [place], Fellow of the [employer] of Neurology. Medical Director of [employer].

## 2021-02-09 NOTE — Addendum Note (Signed)
Addended by: Larey Seat on: 02/09/2021 01:05 PM   Modules accepted: Orders

## 2021-02-09 NOTE — Progress Notes (Signed)
New CPAP will be ordered. OSA is mild , no more hypoxemia according to HST.  IMPRESSION: This HST confirmed the presence of mild OSA (obstructive sleep apnea) without REM dependence . There was a strong correlation of apneas with the supine sleep position. Normal pulse range and no prolonged hypoxia were also noted.    RECOMMENDATION: continuation of CPAP therapy is needed. The patient will need a new PAP machine , with a setting of 7-17 cm water, 2 cm EPR and heated humidification. He can chose the mask he likes best.  He can continue to use nasal spray as needed.

## 2021-02-09 NOTE — Procedures (Signed)
[facility] TEST ( HST by [facility])  STUDY DATE: 01/21/21  DOB: 28-Jun-1946  MRN: [id number]  ORDERING CLINICIAN: Larey Seat, MD   REFERRING CLINICIAN: Leanna Battles, MD   CLINICAL INFORMATION/HISTORY: John Potts is a 75 y.o. male, and was seen here originally upon a consultation -referral from Dr. Philip Aspen. 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  He quit smoking about 4 years ago for. He has been followed here since 2009 on and off but his current CPAP machine, which he uses for the treatment of obstructive sleep apnea, is now over 34 years old.  He is well aware of the difficulties with the supply chain at this time any electronic devices and would like to get the new order on the books.  I have a compliance report here for his current machine and over the last 30 days he was 100% compliant user by days and by hours of daily use.  Average use at time is 9 hours 23 minutes.   His CPAP is set to a pressure of 15 cmH2O this 2 cm EPR, his residual AHI is 5.0 and the vast majority of residual apneas is obstructive in nature.  He seems not to have any major air leak on the contrary his 95th percentile air leak data showed 0.5 L/min.  He did have some condensation water problems last time I spoke with him and he has reduced his humidifier settings and since then that problem has much improved.  His last baseline sleep study would be over 5 years ago.  Epworth sleepiness score: 6/24.  BMI: 24.4 kg/m  Neck Circumference: 16 "  FINDINGS:   Total Record Time (hours, min): 7 h 34 min  Total Sleep Time (hours, min):  6 h 16 min   Percent REM (%):    26.67 %   Calculated pAHI (per hour): 16.6       REM pAHI: 15.8     NREM pAHI: 17.0 Supine AHI:26.6   Oxygen Saturation (%) Mean: 92  Minimum oxygen saturation (%):        83   O2 Saturation Range (%): 83-98  O2Saturation  (minutes) <=88%: 4.9 min   Pulse Mean (bpm):    76  Pulse Range (63-101)   IMPRESSION: This HST confirmed the presence of mild OSA (obstructive sleep apnea) without REM dependence . There was a strong correlation of apneas with the supine sleep position. Normal pulse range and no prolonged hypoxia were also noted.    RECOMMENDATION: continuation of CPAP therapy is needed. The patient will need a new PAP machine , with a setting of 7-17 cm water, 2 cm EPR and heated humidification. He can chose the mask he likes best.  He can continue to use nasal spray as needed.    INTERPRETING PHYSICIAN:  Larey Seat, MD  [facility] and [facility] Sleep Board certified by The [employer] of Sleep Medicine and Diplomate of the [employer] of Sleep Medicine. Board certified In Neurology through the [place], Fellow of the [employer] of Neurology. Medical Director of [employer] [facility]

## 2021-02-11 ENCOUNTER — Telehealth: Payer: Self-pay | Admitting: Neurology

## 2021-02-11 ENCOUNTER — Encounter: Payer: Self-pay | Admitting: Neurology

## 2021-02-11 NOTE — Telephone Encounter (Signed)
I called pt. I advised pt that Dr. Brett Fairy reviewed their sleep study results and found that pt has sleep apnea. Dr. Brett Fairy recommends that pt continues CPAP and ordered auto CPAP 7-17 cm water pressure. I reviewed PAP compliance expectations with the pt. Pt is agreeable to starting a CPAP. I advised pt that an order will be sent to a DME, [facility] and [facility]) will call the pt within about one week after they file with the pt's insurance. [facility]) will show the pt how to use the machine, fit for masks, and troubleshoot the CPAP if needed. A follow up appt will need to be made for insurance purposes with Dr. Brett Fairy or NP. Pt verbalized understanding to arrive 15 minutes early and bring their CPAP. A letter with all of this information in it will be mailed to the pt as a reminder. I verified with the pt that the address we have on file is correct. Pt verbalized understanding of results. Pt had no questions at this time but was encouraged to call back if questions arise. I have sent the order to [facility])  and have received confirmation that they have received the order.

## 2021-02-11 NOTE — Telephone Encounter (Signed)
-----   Message from Larey Seat, MD sent at 02/09/2021  1:05 PM EST ----- New CPAP will be ordered. OSA is mild , no more hypoxemia according to HST.  IMPRESSION: This HST confirmed the presence of mild OSA (obstructive sleep apnea) without REM dependence . There was a strong correlation of apneas with the supine sleep position. Normal pulse range and no prolonged hypoxia were also noted.    RECOMMENDATION: continuation of CPAP therapy is needed. The patient will need a new PAP machine , with a setting of 7-17 cm water, 2 cm EPR and heated humidification. He can chose the mask he likes best.  He can continue to use nasal spray as needed.

## 2021-02-13 DIAGNOSIS — I10 Essential (primary) hypertension: Secondary | ICD-10-CM | POA: Diagnosis not present

## 2021-02-13 DIAGNOSIS — R0789 Other chest pain: Secondary | ICD-10-CM | POA: Diagnosis not present

## 2021-02-13 DIAGNOSIS — G4733 Obstructive sleep apnea (adult) (pediatric): Secondary | ICD-10-CM | POA: Diagnosis not present

## 2021-02-13 DIAGNOSIS — K582 Mixed irritable bowel syndrome: Secondary | ICD-10-CM | POA: Diagnosis not present

## 2021-02-13 DIAGNOSIS — K219 Gastro-esophageal reflux disease without esophagitis: Secondary | ICD-10-CM | POA: Diagnosis not present

## 2021-02-13 DIAGNOSIS — E785 Hyperlipidemia, unspecified: Secondary | ICD-10-CM | POA: Diagnosis not present

## 2021-03-13 DIAGNOSIS — I10 Essential (primary) hypertension: Secondary | ICD-10-CM | POA: Diagnosis not present

## 2021-03-13 DIAGNOSIS — K219 Gastro-esophageal reflux disease without esophagitis: Secondary | ICD-10-CM | POA: Diagnosis not present

## 2021-03-25 DIAGNOSIS — M25552 Pain in left hip: Secondary | ICD-10-CM | POA: Diagnosis not present

## 2021-04-24 DIAGNOSIS — E785 Hyperlipidemia, unspecified: Secondary | ICD-10-CM | POA: Diagnosis not present

## 2021-04-24 DIAGNOSIS — R7301 Impaired fasting glucose: Secondary | ICD-10-CM | POA: Diagnosis not present

## 2021-04-24 DIAGNOSIS — M109 Gout, unspecified: Secondary | ICD-10-CM | POA: Diagnosis not present

## 2021-04-24 DIAGNOSIS — Z125 Encounter for screening for malignant neoplasm of prostate: Secondary | ICD-10-CM | POA: Diagnosis not present

## 2021-05-01 DIAGNOSIS — E785 Hyperlipidemia, unspecified: Secondary | ICD-10-CM | POA: Diagnosis not present

## 2021-05-01 DIAGNOSIS — K219 Gastro-esophageal reflux disease without esophagitis: Secondary | ICD-10-CM | POA: Diagnosis not present

## 2021-05-01 DIAGNOSIS — N529 Male erectile dysfunction, unspecified: Secondary | ICD-10-CM | POA: Diagnosis not present

## 2021-05-01 DIAGNOSIS — R7301 Impaired fasting glucose: Secondary | ICD-10-CM | POA: Diagnosis not present

## 2021-05-01 DIAGNOSIS — I10 Essential (primary) hypertension: Secondary | ICD-10-CM | POA: Diagnosis not present

## 2021-05-01 DIAGNOSIS — Z0000 Encounter for general adult medical examination without abnormal findings: Secondary | ICD-10-CM | POA: Diagnosis not present

## 2021-05-01 DIAGNOSIS — M109 Gout, unspecified: Secondary | ICD-10-CM | POA: Diagnosis not present

## 2021-05-01 DIAGNOSIS — R972 Elevated prostate specific antigen [PSA]: Secondary | ICD-10-CM | POA: Diagnosis not present

## 2021-05-01 DIAGNOSIS — Z1212 Encounter for screening for malignant neoplasm of rectum: Secondary | ICD-10-CM | POA: Diagnosis not present

## 2021-05-01 DIAGNOSIS — R82998 Other abnormal findings in urine: Secondary | ICD-10-CM | POA: Diagnosis not present

## 2021-05-01 DIAGNOSIS — R6889 Other general symptoms and signs: Secondary | ICD-10-CM | POA: Diagnosis not present

## 2021-06-09 DIAGNOSIS — L812 Freckles: Secondary | ICD-10-CM | POA: Diagnosis not present

## 2021-06-09 DIAGNOSIS — D1801 Hemangioma of skin and subcutaneous tissue: Secondary | ICD-10-CM | POA: Diagnosis not present

## 2021-06-09 DIAGNOSIS — L570 Actinic keratosis: Secondary | ICD-10-CM | POA: Diagnosis not present

## 2021-06-09 DIAGNOSIS — Z85828 Personal history of other malignant neoplasm of skin: Secondary | ICD-10-CM | POA: Diagnosis not present

## 2021-06-09 DIAGNOSIS — L821 Other seborrheic keratosis: Secondary | ICD-10-CM | POA: Diagnosis not present

## 2021-06-09 DIAGNOSIS — L814 Other melanin hyperpigmentation: Secondary | ICD-10-CM | POA: Diagnosis not present

## 2021-06-09 DIAGNOSIS — L819 Disorder of pigmentation, unspecified: Secondary | ICD-10-CM | POA: Diagnosis not present

## 2021-06-09 DIAGNOSIS — L905 Scar conditions and fibrosis of skin: Secondary | ICD-10-CM | POA: Diagnosis not present

## 2021-06-17 DIAGNOSIS — M25552 Pain in left hip: Secondary | ICD-10-CM | POA: Diagnosis not present

## 2021-07-03 DIAGNOSIS — Z96641 Presence of right artificial hip joint: Secondary | ICD-10-CM | POA: Diagnosis not present

## 2021-07-03 DIAGNOSIS — M1612 Unilateral primary osteoarthritis, left hip: Secondary | ICD-10-CM | POA: Diagnosis not present

## 2021-07-03 DIAGNOSIS — M25552 Pain in left hip: Secondary | ICD-10-CM | POA: Diagnosis not present

## 2021-09-07 DIAGNOSIS — N401 Enlarged prostate with lower urinary tract symptoms: Secondary | ICD-10-CM | POA: Diagnosis not present

## 2021-09-07 DIAGNOSIS — R972 Elevated prostate specific antigen [PSA]: Secondary | ICD-10-CM | POA: Diagnosis not present

## 2021-09-07 DIAGNOSIS — N529 Male erectile dysfunction, unspecified: Secondary | ICD-10-CM | POA: Diagnosis not present

## 2021-09-07 DIAGNOSIS — R3912 Poor urinary stream: Secondary | ICD-10-CM | POA: Diagnosis not present

## 2021-10-07 DIAGNOSIS — M25552 Pain in left hip: Secondary | ICD-10-CM | POA: Diagnosis not present

## 2021-10-20 DIAGNOSIS — G4733 Obstructive sleep apnea (adult) (pediatric): Secondary | ICD-10-CM | POA: Diagnosis not present

## 2021-12-09 DIAGNOSIS — H18591 Other hereditary corneal dystrophies, right eye: Secondary | ICD-10-CM | POA: Diagnosis not present

## 2021-12-09 DIAGNOSIS — H35372 Puckering of macula, left eye: Secondary | ICD-10-CM | POA: Diagnosis not present

## 2021-12-09 DIAGNOSIS — H47022 Hemorrhage in optic nerve sheath, left eye: Secondary | ICD-10-CM | POA: Diagnosis not present

## 2021-12-09 DIAGNOSIS — H5203 Hypermetropia, bilateral: Secondary | ICD-10-CM | POA: Diagnosis not present

## 2021-12-10 DIAGNOSIS — L819 Disorder of pigmentation, unspecified: Secondary | ICD-10-CM | POA: Diagnosis not present

## 2021-12-10 DIAGNOSIS — L570 Actinic keratosis: Secondary | ICD-10-CM | POA: Diagnosis not present

## 2021-12-10 DIAGNOSIS — Z08 Encounter for follow-up examination after completed treatment for malignant neoplasm: Secondary | ICD-10-CM | POA: Diagnosis not present

## 2021-12-10 DIAGNOSIS — Z85828 Personal history of other malignant neoplasm of skin: Secondary | ICD-10-CM | POA: Diagnosis not present

## 2021-12-16 ENCOUNTER — Encounter: Payer: Self-pay | Admitting: Family Medicine

## 2021-12-16 ENCOUNTER — Ambulatory Visit: Payer: Medicare PPO | Admitting: Family Medicine

## 2021-12-16 VITALS — BP 135/78 | HR 83 | Ht 67.5 in | Wt 169.0 lb

## 2021-12-16 DIAGNOSIS — G4733 Obstructive sleep apnea (adult) (pediatric): Secondary | ICD-10-CM

## 2021-12-16 DIAGNOSIS — Z9989 Dependence on other enabling machines and devices: Secondary | ICD-10-CM | POA: Diagnosis not present

## 2021-12-16 NOTE — Progress Notes (Signed)
PATIENT: John Potts DOB: [birth year]  REASON FOR VISIT: follow up HISTORY FROM: patient  Chief Complaint  Patient presents with   Obstructive Sleep Apnea    Rm 1, alone. Here for yearly CPAP f/u. Pt has a new CPAP machine and doing well. No issues or concerns today.      HISTORY OF PRESENT ILLNESS:  12/16/21 ALL:  John Potts is a 75 y.o. male here today for follow up for OSA on CPAP. Repeat sleep study 01/2021 confirmed OSA. He has received his new machine. He is doing well. He is using machine nightly for at least four hours. No concerns with new machine or supplies.      REVIEW OF SYSTEMS: Out of a complete 14 system review of symptoms, the patient complains only of the following symptoms, none and all other reviewed systems are negative.   ALLERGIES: Allergies  Allergen Reactions   Metoprolol     Sluggish, Tired   Wellbutrin [Bupropion]     Fatigue, increased BP     HOME MEDICATIONS: Outpatient Medications Prior to Visit  Medication Sig Dispense Refill   allopurinol (ZYLOPRIM) 300 MG tablet Take 300 mg by mouth daily.     amLODipine (NORVASC) 5 MG tablet Take 5 mg by mouth daily.     famotidine (PEPCID AC) 10 MG chewable tablet Chew 10 mg by mouth daily as needed for heartburn.     Melatonin 3 MG TABS Take 3 mg by mouth at bedtime as needed (sleep).     mometasone (NASONEX) 50 MCG/ACT nasal spray Place 2 sprays into the nose daily. (Patient taking differently: Place 2 sprays into the nose daily as needed (allergies).) 17 g 11   Multiple Vitamins-Minerals (MULTIVITAMIN ADULT PO) multivitamin     olmesartan (BENICAR) 40 MG tablet      Omega-3 Fatty Acids (FISH OIL PO) Take by mouth.     pravastatin (PRAVACHOL) 20 MG tablet Take 20 mg by mouth daily.     tadalafil (CIALIS) 5 MG tablet Take by mouth.     Tamsulosin HCl (FLOMAX) 0.4 MG CAPS Take 0.4 mg by mouth daily.      acetaminophen (TYLENOL) 650 MG CR tablet Take 1,300 mg by mouth every morning.      metoprolol tartrate (LOPRESSOR) 100 MG tablet TAKE 2 HOURS PRIOR TO CT SCAN 1 tablet 0   No facility-administered medications prior to visit.    PAST MEDICAL HISTORY: Past Medical History:  Diagnosis Date   Allergy    Arthritis    Cold intolerance 4/88/8916   Complication of anesthesia    Difficult urinating   Elevated PSA 07/23/2019   Erectile dysfunction    Gout    Hyperlipidemia    Hypertension    Kidney stone    Knee pain    Lung nodules 06/20/2019   OSA on CPAP    Sleep apnea    uses cpap   Weight loss 06/20/2019   Wrist pain 06/20/2019    PAST SURGICAL HISTORY: Past Surgical History:  Procedure Laterality Date   CATARACT EXTRACTION, BILATERAL     LIPOMA EXCISION     MOHS SURGERY     TONSILLECTOMY     TOTAL HIP ARTHROPLASTY Right 02/22/2018   Procedure: RIGHT TOTAL HIP ARTHROPLASTY ANTERIOR APPROACH;  Surgeon: Gaynelle Arabian, MD;  Location: [facility];  Service: Orthopedics;  Laterality: Right;    FAMILY HISTORY: Family History  Problem Relation Age of Onset   Hypertension Mother    Uterine  cancer Mother    Hypertension Father    Stomach cancer Neg Hx    Rectal cancer Neg Hx    Esophageal cancer Neg Hx    Colon polyps Neg Hx    Colon cancer Neg Hx     SOCIAL HISTORY: Social History   Socioeconomic History   Marital status: Married    Spouse name: Not on file   Number of children: Not on file   Years of education: Not on file   Highest education level: Not on file  Occupational History   Occupation: retired  Tobacco Use   Smoking status: Former    Packs/day: 0.50    Years: 50.00    Pack years: 25.00    Types: Cigars, Cigarettes    Quit date: 04/13/2017    Years since quitting: 4.6   Smokeless tobacco: Never   Tobacco comments:    Pt smokes intermittently  Vaping Use   Vaping Use: Former  Substance and Sexual Activity   Alcohol use: Yes    Alcohol/week: 7.0 standard drinks    Types: 7 Standard drinks or equivalent per week    Comment:  [employer]-Daily   Drug use: No   Sexual activity: Not on file  Other Topics Concern   Not on file  Social History Narrative   Drinks 2 cups of coffee in the morning.   Social Determinants of Health   Financial Resource Strain: Not on file  Food Insecurity: Not on file  Transportation Needs: Not on file  Physical Activity: Not on file  Stress: Not on file  Social Connections: Not on file  Intimate Partner Violence: Not on file     PHYSICAL EXAM  Vitals:   12/16/21 0843  BP: 135/78  Pulse: 83  Weight: 169 lb (76.7 kg)  Height: 5' 7.5" (1.715 m)   Body mass index is 26.08 kg/m.  Generalized: Well developed, in no acute distress  Cardiology: normal rate and rhythm, no murmur noted Respiratory: clear to auscultation bilaterally  Neurological examination  Mentation: Alert oriented to time, place, history taking. Follows all commands speech and language fluent Cranial nerve II-XII: Pupils were equal round reactive to light. Extraocular movements were full, visual field were full  Motor: The motor testing reveals 5 over 5 strength of all 4 extremities. Good symmetric motor tone is noted throughout.  Gait and station: Gait is normal.    DIAGNOSTIC DATA (LABS, IMAGING, TESTING) - I reviewed patient records, labs, notes, testing and imaging myself where available.  No flowsheet data found.   Lab Results  Component Value Date   WBC 10.0 04/17/2020   HGB 14.1 04/17/2020   HCT 43.1 04/17/2020   MCV 93.3 04/17/2020   PLT 237 04/17/2020      Component Value Date/Time   NA 142 04/17/2020 2202   K 4.0 04/17/2020 2202   CL 107 04/17/2020 2202   CO2 26 04/17/2020 2202   GLUCOSE 126 (H) 04/17/2020 2202   BUN 14 04/17/2020 2202   CREATININE 0.93 04/17/2020 2202   CREATININE 0.85 06/20/2019 1446   CALCIUM 9.6 04/17/2020 2202   PROT 6.8 06/20/2019 1446   ALBUMIN 4.0 02/17/2018 1050   AST 19 06/20/2019 1446   ALT 22 06/20/2019 1446   ALKPHOS 74 02/17/2018 1050   BILITOT 0.5  06/20/2019 1446   GFRNONAA >60 04/17/2020 2202   GFRNONAA 88 06/20/2019 1446   GFRAA >60 04/17/2020 2202   GFRAA 102 06/20/2019 1446   No results found for: CHOL, HDL, LDLCALC,  LDLDIRECT, TRIG, CHOLHDL No results found for: HGBA1C No results found for: VITAMINB12 No results found for: TSH   ASSESSMENT AND PLAN 75 y.o. year old male  has a past medical history of Allergy, Arthritis, Cold intolerance (1/77/9390), Complication of anesthesia, Elevated PSA (07/23/2019), Erectile dysfunction, Gout, Hyperlipidemia, Hypertension, Kidney stone, Knee pain, Lung nodules (06/20/2019), OSA on CPAP, Sleep apnea, Weight loss (06/20/2019), and Wrist pain (06/20/2019). here with     ICD-10-CM   1. OSA on CPAP  G47.33 For home use only DME continuous positive airway pressure (CPAP)   Z99.89        AHMON TOSI is doing well on CPAP therapy. Compliance report reveals excellent. compliance was encouraged to continue using CPAP nightly and for greater than 4 hours each night. We will update supply orders as indicated. Risks of untreated sleep apnea review and education materials provided. Healthy lifestyle habits encouraged. He will follow up in 1 year, sooner if needed. He verbalizes understanding and agreement with this plan.    Orders Placed This Encounter  Procedures   For home use only DME continuous positive airway pressure (CPAP)    Supplies    Order Specific Question:   Length of Need    Answer:   Lifetime    Order Specific Question:   Patient has OSA or probable OSA    Answer:   Yes    Order Specific Question:   Is the patient currently using CPAP in the home    Answer:   Yes    Order Specific Question:   Settings    Answer:   Other see comments    Order Specific Question:   CPAP supplies needed    Answer:   Mask, headgear, cushions, filters, heated tubing and water chamber     No orders of the defined types were placed in this encounter.     Debbora Presto, FNP-C 12/16/2021, 9:09  AM [facility] [address] [place], [place] [address] [phone]

## 2021-12-16 NOTE — Patient Instructions (Signed)
Please continue using your CPAP regularly. While your insurance requires that you use CPAP at least 4 hours each night on 70% of the nights, I recommend, that you not skip any nights and use it throughout the night if you can. Getting used to CPAP and staying with the treatment long term does take time and patience and discipline. Untreated obstructive sleep apnea when it is moderate to severe can have an adverse impact on cardiovascular health and raise her risk for heart disease, arrhythmias, hypertension, congestive heart failure, stroke and diabetes. Untreated obstructive sleep apnea causes sleep disruption, nonrestorative sleep, and sleep deprivation. This can have an impact on your day to day functioning and cause daytime sleepiness and impairment of cognitive function, memory loss, mood disturbance, and problems focussing. Using CPAP regularly can improve these symptoms.   Follow up with 1 year

## 2021-12-16 NOTE — Progress Notes (Signed)
Cm sent to [facility]

## 2022-01-13 DIAGNOSIS — M25552 Pain in left hip: Secondary | ICD-10-CM | POA: Diagnosis not present

## 2022-01-26 DIAGNOSIS — H353221 Exudative age-related macular degeneration, left eye, with active choroidal neovascularization: Secondary | ICD-10-CM | POA: Diagnosis not present

## 2022-02-24 DIAGNOSIS — H35033 Hypertensive retinopathy, bilateral: Secondary | ICD-10-CM | POA: Diagnosis not present

## 2022-02-24 DIAGNOSIS — H3562 Retinal hemorrhage, left eye: Secondary | ICD-10-CM | POA: Diagnosis not present

## 2022-02-24 DIAGNOSIS — H35363 Drusen (degenerative) of macula, bilateral: Secondary | ICD-10-CM | POA: Diagnosis not present

## 2022-02-24 DIAGNOSIS — H35373 Puckering of macula, bilateral: Secondary | ICD-10-CM | POA: Diagnosis not present

## 2022-03-29 DIAGNOSIS — H3562 Retinal hemorrhage, left eye: Secondary | ICD-10-CM | POA: Diagnosis not present

## 2022-03-29 DIAGNOSIS — H35372 Puckering of macula, left eye: Secondary | ICD-10-CM | POA: Diagnosis not present

## 2022-03-29 DIAGNOSIS — H43392 Other vitreous opacities, left eye: Secondary | ICD-10-CM | POA: Diagnosis not present

## 2022-03-29 DIAGNOSIS — H35033 Hypertensive retinopathy, bilateral: Secondary | ICD-10-CM | POA: Diagnosis not present

## 2022-04-14 DIAGNOSIS — M25552 Pain in left hip: Secondary | ICD-10-CM | POA: Diagnosis not present

## 2022-05-25 DIAGNOSIS — I10 Essential (primary) hypertension: Secondary | ICD-10-CM | POA: Diagnosis not present

## 2022-05-25 DIAGNOSIS — M109 Gout, unspecified: Secondary | ICD-10-CM | POA: Diagnosis not present

## 2022-05-25 DIAGNOSIS — R7301 Impaired fasting glucose: Secondary | ICD-10-CM | POA: Diagnosis not present

## 2022-05-25 DIAGNOSIS — E785 Hyperlipidemia, unspecified: Secondary | ICD-10-CM | POA: Diagnosis not present

## 2022-05-25 DIAGNOSIS — Z125 Encounter for screening for malignant neoplasm of prostate: Secondary | ICD-10-CM | POA: Diagnosis not present

## 2022-05-27 DIAGNOSIS — Z0000 Encounter for general adult medical examination without abnormal findings: Secondary | ICD-10-CM | POA: Diagnosis not present

## 2022-05-31 DIAGNOSIS — M109 Gout, unspecified: Secondary | ICD-10-CM | POA: Diagnosis not present

## 2022-05-31 DIAGNOSIS — N529 Male erectile dysfunction, unspecified: Secondary | ICD-10-CM | POA: Diagnosis not present

## 2022-05-31 DIAGNOSIS — G4733 Obstructive sleep apnea (adult) (pediatric): Secondary | ICD-10-CM | POA: Diagnosis not present

## 2022-05-31 DIAGNOSIS — Z1331 Encounter for screening for depression: Secondary | ICD-10-CM | POA: Diagnosis not present

## 2022-05-31 DIAGNOSIS — Z0000 Encounter for general adult medical examination without abnormal findings: Secondary | ICD-10-CM | POA: Diagnosis not present

## 2022-05-31 DIAGNOSIS — K219 Gastro-esophageal reflux disease without esophagitis: Secondary | ICD-10-CM | POA: Diagnosis not present

## 2022-05-31 DIAGNOSIS — E785 Hyperlipidemia, unspecified: Secondary | ICD-10-CM | POA: Diagnosis not present

## 2022-05-31 DIAGNOSIS — Z1339 Encounter for screening examination for other mental health and behavioral disorders: Secondary | ICD-10-CM | POA: Diagnosis not present

## 2022-05-31 DIAGNOSIS — I10 Essential (primary) hypertension: Secondary | ICD-10-CM | POA: Diagnosis not present

## 2022-05-31 DIAGNOSIS — R7301 Impaired fasting glucose: Secondary | ICD-10-CM | POA: Diagnosis not present

## 2022-05-31 DIAGNOSIS — R972 Elevated prostate specific antigen [PSA]: Secondary | ICD-10-CM | POA: Diagnosis not present

## 2022-06-01 DIAGNOSIS — R82998 Other abnormal findings in urine: Secondary | ICD-10-CM | POA: Diagnosis not present

## 2022-06-10 DIAGNOSIS — L821 Other seborrheic keratosis: Secondary | ICD-10-CM | POA: Diagnosis not present

## 2022-06-10 DIAGNOSIS — D225 Melanocytic nevi of trunk: Secondary | ICD-10-CM | POA: Diagnosis not present

## 2022-06-10 DIAGNOSIS — L814 Other melanin hyperpigmentation: Secondary | ICD-10-CM | POA: Diagnosis not present

## 2022-06-10 DIAGNOSIS — L570 Actinic keratosis: Secondary | ICD-10-CM | POA: Diagnosis not present

## 2022-07-08 DIAGNOSIS — H3562 Retinal hemorrhage, left eye: Secondary | ICD-10-CM | POA: Diagnosis not present

## 2022-07-08 DIAGNOSIS — H35373 Puckering of macula, bilateral: Secondary | ICD-10-CM | POA: Diagnosis not present

## 2022-07-08 DIAGNOSIS — H35033 Hypertensive retinopathy, bilateral: Secondary | ICD-10-CM | POA: Diagnosis not present

## 2022-07-08 DIAGNOSIS — H43392 Other vitreous opacities, left eye: Secondary | ICD-10-CM | POA: Diagnosis not present

## 2022-09-17 DIAGNOSIS — R972 Elevated prostate specific antigen [PSA]: Secondary | ICD-10-CM | POA: Diagnosis not present

## 2022-09-17 DIAGNOSIS — N400 Enlarged prostate without lower urinary tract symptoms: Secondary | ICD-10-CM | POA: Diagnosis not present

## 2022-09-17 DIAGNOSIS — N529 Male erectile dysfunction, unspecified: Secondary | ICD-10-CM | POA: Diagnosis not present

## 2022-09-22 DIAGNOSIS — M25552 Pain in left hip: Secondary | ICD-10-CM | POA: Diagnosis not present

## 2022-10-11 DIAGNOSIS — L578 Other skin changes due to chronic exposure to nonionizing radiation: Secondary | ICD-10-CM | POA: Diagnosis not present

## 2022-12-08 DIAGNOSIS — M25552 Pain in left hip: Secondary | ICD-10-CM | POA: Diagnosis not present

## 2022-12-15 DIAGNOSIS — Z961 Presence of intraocular lens: Secondary | ICD-10-CM | POA: Diagnosis not present

## 2022-12-15 DIAGNOSIS — H52203 Unspecified astigmatism, bilateral: Secondary | ICD-10-CM | POA: Diagnosis not present

## 2022-12-15 DIAGNOSIS — H35372 Puckering of macula, left eye: Secondary | ICD-10-CM | POA: Diagnosis not present

## 2022-12-15 DIAGNOSIS — H4321 Crystalline deposits in vitreous body, right eye: Secondary | ICD-10-CM | POA: Diagnosis not present

## 2022-12-15 DIAGNOSIS — H524 Presbyopia: Secondary | ICD-10-CM | POA: Diagnosis not present

## 2022-12-15 DIAGNOSIS — H5203 Hypermetropia, bilateral: Secondary | ICD-10-CM | POA: Diagnosis not present

## 2022-12-15 DIAGNOSIS — H18593 Other hereditary corneal dystrophies, bilateral: Secondary | ICD-10-CM | POA: Diagnosis not present

## 2022-12-15 NOTE — Progress Notes (Deleted)
PATIENT: John Potts DOB: 20-Feb-1946  REASON FOR VISIT: follow up HISTORY FROM: patient  No chief complaint on file.    HISTORY OF PRESENT ILLNESS:  12/15/22 ALL:  John Potts returns for follow up for OSA on CPAP.   12/16/2021 ALL: John Potts is a 76 y.o. male here today for follow up for OSA on CPAP. Repeat sleep study 01/2021 confirmed OSA. He has received his new machine. He is doing well. He is using machine nightly for at least four hours. No concerns with new machine or supplies.      REVIEW OF SYSTEMS: Out of a complete 14 system review of symptoms, the patient complains only of the following symptoms, none and all other reviewed systems are negative.   ALLERGIES: Allergies  Allergen Reactions   Metoprolol     Sluggish, Tired   Wellbutrin [Bupropion]     Fatigue, increased BP     HOME MEDICATIONS: Outpatient Medications Prior to Visit  Medication Sig Dispense Refill   allopurinol (ZYLOPRIM) 300 MG tablet Take 300 mg by mouth daily.     amLODipine (NORVASC) 5 MG tablet Take 5 mg by mouth daily.     famotidine (PEPCID AC) 10 MG chewable tablet Chew 10 mg by mouth daily as needed for heartburn.     Melatonin 3 MG TABS Take 3 mg by mouth at bedtime as needed (sleep).     mometasone (NASONEX) 50 MCG/ACT nasal spray Place 2 sprays into the nose daily. (Patient taking differently: Place 2 sprays into the nose daily as needed (allergies).) 17 g 11   Multiple Vitamins-Minerals (MULTIVITAMIN ADULT PO) multivitamin     olmesartan (BENICAR) 40 MG tablet      Omega-3 Fatty Acids (FISH OIL PO) Take by mouth.     pravastatin (PRAVACHOL) 20 MG tablet Take 20 mg by mouth daily.     tadalafil (CIALIS) 5 MG tablet Take by mouth.     Tamsulosin HCl (FLOMAX) 0.4 MG CAPS Take 0.4 mg by mouth daily.      No facility-administered medications prior to visit.    PAST MEDICAL HISTORY: Past Medical History:  Diagnosis Date   Allergy    Arthritis    Cold intolerance 05/28/931    Complication of anesthesia    Difficult urinating   Elevated PSA 07/23/2019   Erectile dysfunction    Gout    Hyperlipidemia    Hypertension    Kidney stone    Knee pain    Lung nodules 06/20/2019   OSA on CPAP    Sleep apnea    uses cpap   Weight loss 06/20/2019   Wrist pain 06/20/2019    PAST SURGICAL HISTORY: Past Surgical History:  Procedure Laterality Date   CATARACT EXTRACTION, BILATERAL     LIPOMA EXCISION     MOHS SURGERY     TONSILLECTOMY     TOTAL HIP ARTHROPLASTY Right 02/22/2018   Procedure: RIGHT TOTAL HIP ARTHROPLASTY ANTERIOR APPROACH;  Surgeon: Gaynelle Arabian, MD;  Location: [facility];  Service: Orthopedics;  Laterality: Right;    FAMILY HISTORY: Family History  Problem Relation Age of Onset   Hypertension Mother    Uterine cancer Mother    Hypertension Father    Stomach cancer Neg Hx    Rectal cancer Neg Hx    Esophageal cancer Neg Hx    Colon polyps Neg Hx    Colon cancer Neg Hx     SOCIAL HISTORY: Social History   Socioeconomic History  Marital status: Married    Spouse name: Not on file   Number of children: Not on file   Years of education: Not on file   Highest education level: Not on file  Occupational History   Occupation: retired  Tobacco Use   Smoking status: Former    Packs/day: 0.50    Years: 50.00    Total pack years: 25.00    Types: Cigars, Cigarettes    Quit date: 04/13/2017    Years since quitting: 5.6   Smokeless tobacco: Never   Tobacco comments:    Pt smokes intermittently  Vaping Use   Vaping Use: Former  Substance and Sexual Activity   Alcohol use: Yes    Alcohol/week: 7.0 standard drinks of alcohol    Types: 7 Standard drinks or equivalent per week    Comment: [employer]-Daily   Drug use: No   Sexual activity: Not on file  Other Topics Concern   Not on file  Social History Narrative   Drinks 2 cups of coffee in the morning.   Social Determinants of Health   Financial Resource Strain: Not on file  Food  Insecurity: Not on file  Transportation Needs: Not on file  Physical Activity: Not on file  Stress: Not on file  Social Connections: Not on file  Intimate Partner Violence: Not on file     PHYSICAL EXAM  There were no vitals filed for this visit.  There is no height or weight on file to calculate BMI.  Generalized: Well developed, in no acute distress  Cardiology: normal rate and rhythm, no murmur noted Respiratory: clear to auscultation bilaterally  Neurological examination  Mentation: Alert oriented to time, place, history taking. Follows all commands speech and language fluent Cranial nerve II-XII: Pupils were equal round reactive to light. Extraocular movements were full, visual field were full  Motor: The motor testing reveals 5 over 5 strength of all 4 extremities. Good symmetric motor tone is noted throughout.  Gait and station: Gait is normal.    DIAGNOSTIC DATA (LABS, IMAGING, TESTING) - I reviewed patient records, labs, notes, testing and imaging myself where available.      No data to display           Lab Results  Component Value Date   WBC 10.0 04/17/2020   HGB 14.1 04/17/2020   HCT 43.1 04/17/2020   MCV 93.3 04/17/2020   PLT 237 04/17/2020      Component Value Date/Time   NA 142 04/17/2020 2202   K 4.0 04/17/2020 2202   CL 107 04/17/2020 2202   CO2 26 04/17/2020 2202   GLUCOSE 126 (H) 04/17/2020 2202   BUN 14 04/17/2020 2202   CREATININE 0.93 04/17/2020 2202   CREATININE 0.85 06/20/2019 1446   CALCIUM 9.6 04/17/2020 2202   PROT 6.8 06/20/2019 1446   ALBUMIN 4.0 02/17/2018 1050   AST 19 06/20/2019 1446   ALT 22 06/20/2019 1446   ALKPHOS 74 02/17/2018 1050   BILITOT 0.5 06/20/2019 1446   GFRNONAA >60 04/17/2020 2202   GFRNONAA 88 06/20/2019 1446   GFRAA >60 04/17/2020 2202   GFRAA 102 06/20/2019 1446   No results found for: "CHOL", "HDL", "LDLCALC", "LDLDIRECT", "TRIG", "CHOLHDL" No results found for: "HGBA1C" No results found for:  "VITAMINB12" No results found for: "TSH"   ASSESSMENT AND PLAN 76 y.o. year old male  has a past medical history of Allergy, Arthritis, Cold intolerance (5/40/9811), Complication of anesthesia, Elevated PSA (07/23/2019), Erectile dysfunction, Gout, Hyperlipidemia, Hypertension, Kidney  stone, Knee pain, Lung nodules (06/20/2019), OSA on CPAP, Sleep apnea, Weight loss (06/20/2019), and Wrist pain (06/20/2019). here with   No diagnosis found.    John Potts is doing well on CPAP therapy. Compliance report reveals excellent. compliance was encouraged to continue using CPAP nightly and for greater than 4 hours each night. We will update supply orders as indicated. Risks of untreated sleep apnea review and education materials provided. Healthy lifestyle habits encouraged. He will follow up in 1 year, sooner if needed. He verbalizes understanding and agreement with this plan.    No orders of the defined types were placed in this encounter.    No orders of the defined types were placed in this encounter.     Debbora Presto, FNP-C 12/15/2022, 3:15 PM [facility] [address], [place], [place] [address] [phone]

## 2022-12-15 NOTE — Patient Instructions (Incomplete)

## 2022-12-16 ENCOUNTER — Ambulatory Visit: Payer: Medicare PPO | Admitting: Family Medicine

## 2022-12-16 DIAGNOSIS — G4733 Obstructive sleep apnea (adult) (pediatric): Secondary | ICD-10-CM

## 2022-12-31 DIAGNOSIS — R0981 Nasal congestion: Secondary | ICD-10-CM | POA: Diagnosis not present

## 2022-12-31 DIAGNOSIS — J029 Acute pharyngitis, unspecified: Secondary | ICD-10-CM | POA: Diagnosis not present

## 2022-12-31 DIAGNOSIS — R051 Acute cough: Secondary | ICD-10-CM | POA: Diagnosis not present

## 2022-12-31 DIAGNOSIS — Z1152 Encounter for screening for COVID-19: Secondary | ICD-10-CM | POA: Diagnosis not present

## 2022-12-31 DIAGNOSIS — R6883 Chills (without fever): Secondary | ICD-10-CM | POA: Diagnosis not present

## 2022-12-31 DIAGNOSIS — I10 Essential (primary) hypertension: Secondary | ICD-10-CM | POA: Diagnosis not present

## 2022-12-31 DIAGNOSIS — H9203 Otalgia, bilateral: Secondary | ICD-10-CM | POA: Diagnosis not present

## 2022-12-31 DIAGNOSIS — J101 Influenza due to other identified influenza virus with other respiratory manifestations: Secondary | ICD-10-CM | POA: Diagnosis not present

## 2023-01-18 DIAGNOSIS — H4321 Crystalline deposits in vitreous body, right eye: Secondary | ICD-10-CM | POA: Diagnosis not present

## 2023-01-18 DIAGNOSIS — H35453 Secondary pigmentary degeneration, bilateral: Secondary | ICD-10-CM | POA: Diagnosis not present

## 2023-01-18 DIAGNOSIS — H3562 Retinal hemorrhage, left eye: Secondary | ICD-10-CM | POA: Diagnosis not present

## 2023-01-18 DIAGNOSIS — H43392 Other vitreous opacities, left eye: Secondary | ICD-10-CM | POA: Diagnosis not present

## 2023-01-18 DIAGNOSIS — H35372 Puckering of macula, left eye: Secondary | ICD-10-CM | POA: Diagnosis not present

## 2023-01-24 DIAGNOSIS — G4733 Obstructive sleep apnea (adult) (pediatric): Secondary | ICD-10-CM | POA: Diagnosis not present

## 2023-01-24 NOTE — Patient Instructions (Signed)

## 2023-01-24 NOTE — Progress Notes (Unsigned)
PATIENT: John Potts DOB: 03-13-1946  REASON FOR VISIT: follow up HISTORY FROM: patient  No chief complaint on file.    HISTORY OF PRESENT ILLNESS:  01/24/23 ALL:  John Potts returns for follow up for OSA on CPAP.      12/16/2021 ALL: John Potts is a 77 y.o. male here today for follow up for OSA on CPAP. Repeat sleep study 01/2021 confirmed OSA. He has received his new machine. He is doing well. He is using machine nightly for at least four hours. No concerns with new machine or supplies.      REVIEW OF SYSTEMS: Out of a complete 14 system review of symptoms, the patient complains only of the following symptoms, none and all other reviewed systems are negative.   ALLERGIES: Allergies  Allergen Reactions   Metoprolol     Sluggish, Tired   Wellbutrin [Bupropion]     Fatigue, increased BP     HOME MEDICATIONS: Outpatient Medications Prior to Visit  Medication Sig Dispense Refill   allopurinol (ZYLOPRIM) 300 MG tablet Take 300 mg by mouth daily.     amLODipine (NORVASC) 5 MG tablet Take 5 mg by mouth daily.     famotidine (PEPCID AC) 10 MG chewable tablet Chew 10 mg by mouth daily as needed for heartburn.     Melatonin 3 MG TABS Take 3 mg by mouth at bedtime as needed (sleep).     mometasone (NASONEX) 50 MCG/ACT nasal spray Place 2 sprays into the nose daily. (Patient taking differently: Place 2 sprays into the nose daily as needed (allergies).) 17 g 11   Multiple Vitamins-Minerals (MULTIVITAMIN ADULT PO) multivitamin     olmesartan (BENICAR) 40 MG tablet      Omega-3 Fatty Acids (FISH OIL PO) Take by mouth.     pravastatin (PRAVACHOL) 20 MG tablet Take 20 mg by mouth daily.     tadalafil (CIALIS) 5 MG tablet Take by mouth.     Tamsulosin HCl (FLOMAX) 0.4 MG CAPS Take 0.4 mg by mouth daily.      No facility-administered medications prior to visit.    PAST MEDICAL HISTORY: Past Medical History:  Diagnosis Date   Allergy    Arthritis    Cold intolerance  0/62/6948   Complication of anesthesia    Difficult urinating   Elevated PSA 07/23/2019   Erectile dysfunction    Gout    Hyperlipidemia    Hypertension    Kidney stone    Knee pain    Lung nodules 06/20/2019   OSA on CPAP    Sleep apnea    uses cpap   Weight loss 06/20/2019   Wrist pain 06/20/2019    PAST SURGICAL HISTORY: Past Surgical History:  Procedure Laterality Date   CATARACT EXTRACTION, BILATERAL     LIPOMA EXCISION     MOHS SURGERY     TONSILLECTOMY     TOTAL HIP ARTHROPLASTY Right 02/22/2018   Procedure: RIGHT TOTAL HIP ARTHROPLASTY ANTERIOR APPROACH;  Surgeon: Gaynelle Arabian, MD;  Location: [facility];  Service: Orthopedics;  Laterality: Right;    FAMILY HISTORY: Family History  Problem Relation Age of Onset   Hypertension Mother    Uterine cancer Mother    Hypertension Father    Stomach cancer Neg Hx    Rectal cancer Neg Hx    Esophageal cancer Neg Hx    Colon polyps Neg Hx    Colon cancer Neg Hx     SOCIAL HISTORY: Social History  Socioeconomic History   Marital status: Married    Spouse name: Not on file   Number of children: Not on file   Years of education: Not on file   Highest education level: Not on file  Occupational History   Occupation: retired  Tobacco Use   Smoking status: Former    Packs/day: 0.50    Years: 50.00    Total pack years: 25.00    Types: Cigars, Cigarettes    Quit date: 04/13/2017    Years since quitting: 5.7   Smokeless tobacco: Never   Tobacco comments:    Pt smokes intermittently  Vaping Use   Vaping Use: Former  Substance and Sexual Activity   Alcohol use: Yes    Alcohol/week: 7.0 standard drinks of alcohol    Types: 7 Standard drinks or equivalent per week    Comment: [employer]-Daily   Drug use: No   Sexual activity: Not on file  Other Topics Concern   Not on file  Social History Narrative   Drinks 2 cups of coffee in the morning.   Social Determinants of Health   Financial Resource Strain: Not on file   Food Insecurity: Not on file  Transportation Needs: Not on file  Physical Activity: Not on file  Stress: Not on file  Social Connections: Not on file  Intimate Partner Violence: Not on file     PHYSICAL EXAM  There were no vitals filed for this visit.  There is no height or weight on file to calculate BMI.  Generalized: Well developed, in no acute distress  Cardiology: normal rate and rhythm, no murmur noted Respiratory: clear to auscultation bilaterally  Neurological examination  Mentation: Alert oriented to time, place, history taking. Follows all commands speech and language fluent Cranial nerve II-XII: Pupils were equal round reactive to light. Extraocular movements were full, visual field were full  Motor: The motor testing reveals 5 over 5 strength of all 4 extremities. Good symmetric motor tone is noted throughout.  Gait and station: Gait is normal.    DIAGNOSTIC DATA (LABS, IMAGING, TESTING) - I reviewed patient records, labs, notes, testing and imaging myself where available.      No data to display           Lab Results  Component Value Date   WBC 10.0 04/17/2020   HGB 14.1 04/17/2020   HCT 43.1 04/17/2020   MCV 93.3 04/17/2020   PLT 237 04/17/2020      Component Value Date/Time   NA 142 04/17/2020 2202   K 4.0 04/17/2020 2202   CL 107 04/17/2020 2202   CO2 26 04/17/2020 2202   GLUCOSE 126 (H) 04/17/2020 2202   BUN 14 04/17/2020 2202   CREATININE 0.93 04/17/2020 2202   CREATININE 0.85 06/20/2019 1446   CALCIUM 9.6 04/17/2020 2202   PROT 6.8 06/20/2019 1446   ALBUMIN 4.0 02/17/2018 1050   AST 19 06/20/2019 1446   ALT 22 06/20/2019 1446   ALKPHOS 74 02/17/2018 1050   BILITOT 0.5 06/20/2019 1446   GFRNONAA >60 04/17/2020 2202   GFRNONAA 88 06/20/2019 1446   GFRAA >60 04/17/2020 2202   GFRAA 102 06/20/2019 1446   No results found for: "CHOL", "HDL", "LDLCALC", "LDLDIRECT", "TRIG", "CHOLHDL" No results found for: "HGBA1C" No results found for:  "VITAMINB12" No results found for: "TSH"   ASSESSMENT AND PLAN 77 y.o. year old male  has a past medical history of Allergy, Arthritis, Cold intolerance (0/93/8182), Complication of anesthesia, Elevated PSA (07/23/2019), Erectile dysfunction,  Gout, Hyperlipidemia, Hypertension, Kidney stone, Knee pain, Lung nodules (06/20/2019), OSA on CPAP, Sleep apnea, Weight loss (06/20/2019), and Wrist pain (06/20/2019). here with   No diagnosis found.    John Potts is doing well on CPAP therapy. Compliance report reveals excellent. compliance was encouraged to continue using CPAP nightly and for greater than 4 hours each night. We will update supply orders as indicated. Risks of untreated sleep apnea review and education materials provided. Healthy lifestyle habits encouraged. He will follow up in 1 year, sooner if needed. He verbalizes understanding and agreement with this plan.    No orders of the defined types were placed in this encounter.    No orders of the defined types were placed in this encounter.     Debbora Presto, FNP-C 01/24/2023, 1:42 PM [facility] [address], [place], [place] [address] [phone]

## 2023-01-25 ENCOUNTER — Ambulatory Visit: Payer: Medicare PPO | Admitting: Family Medicine

## 2023-01-25 ENCOUNTER — Encounter: Payer: Self-pay | Admitting: Family Medicine

## 2023-01-25 VITALS — BP 141/83 | HR 81 | Ht 68.0 in | Wt 162.5 lb

## 2023-01-25 DIAGNOSIS — G4733 Obstructive sleep apnea (adult) (pediatric): Secondary | ICD-10-CM

## 2023-02-16 DIAGNOSIS — D225 Melanocytic nevi of trunk: Secondary | ICD-10-CM | POA: Diagnosis not present

## 2023-02-16 DIAGNOSIS — L821 Other seborrheic keratosis: Secondary | ICD-10-CM | POA: Diagnosis not present

## 2023-02-16 DIAGNOSIS — L814 Other melanin hyperpigmentation: Secondary | ICD-10-CM | POA: Diagnosis not present

## 2023-02-16 DIAGNOSIS — Z85828 Personal history of other malignant neoplasm of skin: Secondary | ICD-10-CM | POA: Diagnosis not present

## 2023-02-16 DIAGNOSIS — Z08 Encounter for follow-up examination after completed treatment for malignant neoplasm: Secondary | ICD-10-CM | POA: Diagnosis not present

## 2023-03-09 DIAGNOSIS — M25552 Pain in left hip: Secondary | ICD-10-CM | POA: Diagnosis not present

## 2023-04-25 DIAGNOSIS — G4733 Obstructive sleep apnea (adult) (pediatric): Secondary | ICD-10-CM | POA: Diagnosis not present

## 2023-07-23 DIAGNOSIS — G4733 Obstructive sleep apnea (adult) (pediatric): Secondary | ICD-10-CM | POA: Diagnosis not present

## 2023-07-25 DIAGNOSIS — H3562 Retinal hemorrhage, left eye: Secondary | ICD-10-CM | POA: Diagnosis not present

## 2023-07-25 DIAGNOSIS — Z961 Presence of intraocular lens: Secondary | ICD-10-CM | POA: Diagnosis not present

## 2023-07-25 DIAGNOSIS — H35453 Secondary pigmentary degeneration, bilateral: Secondary | ICD-10-CM | POA: Diagnosis not present

## 2023-07-25 DIAGNOSIS — H35033 Hypertensive retinopathy, bilateral: Secondary | ICD-10-CM | POA: Diagnosis not present

## 2023-07-25 DIAGNOSIS — H35372 Puckering of macula, left eye: Secondary | ICD-10-CM | POA: Diagnosis not present

## 2023-07-27 DIAGNOSIS — M25552 Pain in left hip: Secondary | ICD-10-CM | POA: Diagnosis not present

## 2023-08-08 DIAGNOSIS — R7301 Impaired fasting glucose: Secondary | ICD-10-CM | POA: Diagnosis not present

## 2023-08-08 DIAGNOSIS — R972 Elevated prostate specific antigen [PSA]: Secondary | ICD-10-CM | POA: Diagnosis not present

## 2023-08-08 DIAGNOSIS — E785 Hyperlipidemia, unspecified: Secondary | ICD-10-CM | POA: Diagnosis not present

## 2023-08-08 DIAGNOSIS — M109 Gout, unspecified: Secondary | ICD-10-CM | POA: Diagnosis not present

## 2023-08-08 DIAGNOSIS — I10 Essential (primary) hypertension: Secondary | ICD-10-CM | POA: Diagnosis not present

## 2023-08-08 DIAGNOSIS — Z1212 Encounter for screening for malignant neoplasm of rectum: Secondary | ICD-10-CM | POA: Diagnosis not present

## 2023-08-15 DIAGNOSIS — R82998 Other abnormal findings in urine: Secondary | ICD-10-CM | POA: Diagnosis not present

## 2023-08-15 DIAGNOSIS — Z1331 Encounter for screening for depression: Secondary | ICD-10-CM | POA: Diagnosis not present

## 2023-08-15 DIAGNOSIS — Z1212 Encounter for screening for malignant neoplasm of rectum: Secondary | ICD-10-CM | POA: Diagnosis not present

## 2023-08-15 DIAGNOSIS — I10 Essential (primary) hypertension: Secondary | ICD-10-CM | POA: Diagnosis not present

## 2023-08-15 DIAGNOSIS — R0981 Nasal congestion: Secondary | ICD-10-CM | POA: Diagnosis not present

## 2023-08-15 DIAGNOSIS — G4733 Obstructive sleep apnea (adult) (pediatric): Secondary | ICD-10-CM | POA: Diagnosis not present

## 2023-08-15 DIAGNOSIS — E785 Hyperlipidemia, unspecified: Secondary | ICD-10-CM | POA: Diagnosis not present

## 2023-08-15 DIAGNOSIS — Z1339 Encounter for screening examination for other mental health and behavioral disorders: Secondary | ICD-10-CM | POA: Diagnosis not present

## 2023-08-15 DIAGNOSIS — M109 Gout, unspecified: Secondary | ICD-10-CM | POA: Diagnosis not present

## 2023-08-15 DIAGNOSIS — Z0000 Encounter for general adult medical examination without abnormal findings: Secondary | ICD-10-CM | POA: Diagnosis not present

## 2023-08-25 DIAGNOSIS — M1612 Unilateral primary osteoarthritis, left hip: Secondary | ICD-10-CM | POA: Diagnosis not present

## 2023-08-25 DIAGNOSIS — M25552 Pain in left hip: Secondary | ICD-10-CM | POA: Diagnosis not present

## 2023-09-06 DIAGNOSIS — R972 Elevated prostate specific antigen [PSA]: Secondary | ICD-10-CM | POA: Diagnosis not present

## 2023-09-06 DIAGNOSIS — R3912 Poor urinary stream: Secondary | ICD-10-CM | POA: Diagnosis not present

## 2023-09-06 DIAGNOSIS — N401 Enlarged prostate with lower urinary tract symptoms: Secondary | ICD-10-CM | POA: Diagnosis not present

## 2023-09-08 ENCOUNTER — Other Ambulatory Visit (HOSPITAL_COMMUNITY): Payer: Self-pay | Admitting: Urology

## 2023-09-08 DIAGNOSIS — R972 Elevated prostate specific antigen [PSA]: Secondary | ICD-10-CM

## 2023-09-17 ENCOUNTER — Ambulatory Visit (HOSPITAL_COMMUNITY)
Admission: RE | Admit: 2023-09-17 | Discharge: 2023-09-17 | Disposition: A | Discharge status: Home / Self Care | Payer: Medicare PPO | Source: Ambulatory Visit | Attending: Urology | Admitting: Urology

## 2023-09-17 DIAGNOSIS — R972 Elevated prostate specific antigen [PSA]: Secondary | ICD-10-CM | POA: Diagnosis not present

## 2023-09-17 DIAGNOSIS — N4289 Other specified disorders of prostate: Secondary | ICD-10-CM | POA: Diagnosis not present

## 2023-09-17 DIAGNOSIS — K4090 Unilateral inguinal hernia, without obstruction or gangrene, not specified as recurrent: Secondary | ICD-10-CM | POA: Diagnosis not present

## 2023-09-17 MED ORDER — GADOBUTROL 1 MMOL/ML IV SOLN
7.0000 mL | Freq: Once | INTRAVENOUS | Status: AC | PRN
  Administered 2023-09-17: 7 mL via INTRAVENOUS

## 2023-10-03 DIAGNOSIS — R972 Elevated prostate specific antigen [PSA]: Secondary | ICD-10-CM | POA: Diagnosis not present

## 2023-10-03 DIAGNOSIS — N529 Male erectile dysfunction, unspecified: Secondary | ICD-10-CM | POA: Diagnosis not present

## 2023-10-05 ENCOUNTER — Encounter: Payer: Self-pay | Admitting: Gastroenterology

## 2023-10-23 DIAGNOSIS — G4733 Obstructive sleep apnea (adult) (pediatric): Secondary | ICD-10-CM | POA: Diagnosis not present

## 2023-10-31 DIAGNOSIS — C61 Malignant neoplasm of prostate: Secondary | ICD-10-CM | POA: Diagnosis not present

## 2023-11-04 ENCOUNTER — Encounter: Payer: Self-pay | Admitting: Gastroenterology

## 2023-11-17 DIAGNOSIS — C61 Malignant neoplasm of prostate: Secondary | ICD-10-CM | POA: Diagnosis not present

## 2023-11-18 NOTE — H&P (Signed)
TOTAL HIP ADMISSION H&P  Patient is admitted for left total hip arthroplasty.  Subjective:  Chief Complaint: Left hip pain  HPI: John Potts, 77 y.o. male, has a history of pain and functional disability in the left hip due to arthritis and patient has failed non-surgical conservative treatments for greater than 12 weeks to include NSAID's and/or analgesics, corticosteriod injections, and activity modification. Onset of symptoms was gradual, starting  several  years ago with gradually worsening course since that time. The patient noted no past surgery on the left hip. Patient currently rates pain in the left hip at 9 out of 10 with activity. Patient has night pain, worsening of pain with activity and weight bearing, and trendelenberg gait. Patient has evidence of  severe bone-on-bone arthritis with large osteophyte formation and subchondral cyst formation  by imaging studies. This condition presents safety issues increasing the risk of falls. There is no current active infection.  Patient Active Problem List   Diagnosis Date Noted   Essential hypertension 05/06/2020   Hyperlipidemia 05/06/2020   Elevated PSA 07/23/2019   Cold intolerance 06/20/2019   Lung nodules 06/20/2019   Wrist pain 06/20/2019   Weight loss 06/20/2019   OA (osteoarthritis) of hip 02/22/2018   Pulmonary infiltrates assoc with bronchiectasis on HRCT 05/14/2017   Other hypersomnia 09/28/2016   Hypoxia, sleep related 06/14/2016   Cigarette smoker 10/08/2015   Chronic rhinitis 10/08/2015   Nocturnal hypoxemia 10/07/2015   Obstructive chronic bronchitis with exacerbation (HCC) 09/29/2015   Ethmoid sinusitis 09/29/2015   Hypoxemia 09/29/2015   OSA on CPAP 07/21/2015   Retrognathia 07/21/2015   Chronic fatigue 07/21/2015   Lipoma of back 04/05/2014   Sebaceous cyst 04/05/2014   Chest pain, atypical     Past Medical History:  Diagnosis Date   Allergy    Arthritis    Cold intolerance 06/20/2019   Complication of  anesthesia    Difficult urinating   Elevated PSA 07/23/2019   Erectile dysfunction    Gout    Hyperlipidemia    Hypertension    Kidney stone    Knee pain    Lung nodules 06/20/2019   OSA on CPAP    Sleep apnea    uses cpap   Weight loss 06/20/2019   Wrist pain 06/20/2019    Past Surgical History:  Procedure Laterality Date   CATARACT EXTRACTION, BILATERAL     LIPOMA EXCISION     MOHS SURGERY     TONSILLECTOMY     TOTAL HIP ARTHROPLASTY Right 02/22/2018   Procedure: RIGHT TOTAL HIP ARTHROPLASTY ANTERIOR APPROACH;  Surgeon: Ollen Gross, MD;  Location: [facility];  Service: Orthopedics;  Laterality: Right;    Prior to Admission medications   Medication Sig Start Date End Date Taking? Authorizing Provider  allopurinol (ZYLOPRIM) 300 MG tablet Take 300 mg by mouth daily.    [provider]  amLODipine (NORVASC) 5 MG tablet Take 5 mg by mouth daily.    [provider]  famotidine (PEPCID AC) 10 MG chewable tablet Chew 10 mg by mouth daily as needed for heartburn.    [provider]  Melatonin 3 MG TABS Take 3 mg by mouth at bedtime as needed (sleep).    [provider]  metoprolol tartrate (LOPRESSOR) 25 MG tablet Take 25 mg by mouth 2 (two) times daily.    [provider]  mometasone (NASONEX) 50 MCG/ACT nasal spray Place 2 sprays into the nose daily. Patient taking differently: Place 2 sprays into the  nose daily as needed (allergies). 10/07/15   Nyoka Cowden, MD  Multiple Vitamins-Minerals (MULTIVITAMIN ADULT PO) multivitamin    [provider]  olmesartan (BENICAR) 40 MG tablet  04/25/20   [provider]  Omega-3 Fatty Acids (FISH OIL PO) Take by mouth.    [provider]  pravastatin (PRAVACHOL) 20 MG tablet Take 20 mg by mouth daily.    [provider]  tadalafil (CIALIS) 5 MG tablet Take by mouth.    [provider]  Tamsulosin HCl (FLOMAX) 0.4 MG CAPS Take 0.4 mg by mouth daily.      [provider]    Allergies  Allergen Reactions   Metoprolol     Sluggish, Tired   Wellbutrin [Bupropion]     Fatigue, increased BP     Social History   Socioeconomic History   Marital status: Married    Spouse name: Not on file   Number of children: Not on file   Years of education: Not on file   Highest education level: Not on file  Occupational History   Occupation: retired  Tobacco Use   Smoking status: Former    Current packs/day: 0.00    Average packs/day: 0.5 packs/day for 50.0 years (25.0 ttl pk-yrs)    Types: Cigars, Cigarettes    Start date: 04/14/1967    Quit date: 04/13/2017    Years since quitting: 6.6   Smokeless tobacco: Never   Tobacco comments:    Pt smokes intermittently  Vaping Use   Vaping status: Former  Substance and Sexual Activity   Alcohol use: Yes    Alcohol/week: 7.0 standard drinks of alcohol    Types: 7 Standard drinks or equivalent per week    Comment: [employer]-Daily   Drug use: No   Sexual activity: Not on file  Other Topics Concern   Not on file  Social History Narrative   Drinks 2 cups of coffee in the morning.   Social Determinants of Health   Financial Resource Strain: Not on file  Food Insecurity: Not on file  Transportation Needs: Not on file  Physical Activity: Not on file  Stress: Not on file  Social Connections: Not on file  Intimate Partner Violence: Not on file    Tobacco Use: Medium Risk (01/25/2023)   Patient History    Smoking Tobacco Use: Former    Smokeless Tobacco Use: Never    Passive Exposure: Not on file   Social History   Substance and Sexual Activity  Alcohol Use Yes   Alcohol/week: 7.0 standard drinks of alcohol   Types: 7 Standard drinks or equivalent per week   Comment: [employer]-Daily    Family History  Problem Relation Age of Onset   Hypertension Mother    Uterine cancer Mother    Hypertension Father    Stomach cancer Neg Hx    Rectal cancer Neg Hx    Esophageal cancer Neg Hx     Colon polyps Neg Hx    Colon cancer Neg Hx     Review of Systems  Constitutional:  Negative for chills and fever.  HENT: Negative.    Eyes: Negative.   Respiratory:  Negative for cough and shortness of breath.   Cardiovascular:  Negative for chest pain and palpitations.  Gastrointestinal:  Negative for abdominal pain, nausea and vomiting.  Genitourinary:  Negative for dysuria, frequency and urgency.  Musculoskeletal:  Positive for joint pain.  Skin:  Negative for rash.   Objective:  Physical Exam: Well nourished  and well developed.  General: Alert and oriented x3, cooperative and pleasant, no acute distress.  Head: normocephalic, atraumatic, neck supple.  Eyes: EOMI. Abdomen: non-tender to palpation and soft, normoactive bowel sounds. Musculoskeletal: - Evaluation of the left hip demonstrates Flexion to 100 degrees, No internal rotation, External rotation to 20 degrees and Abduction to 20 degrees - Antalgic gait pattern on the left. Calves soft and nontender. Motor function intact in LE. Strength 5/5 LE bilaterally. Neuro: Distal pulses 2+. Sensation to light touch intact in LE.  Vital signs in last 24 hours: BP: ()/()  Arterial Line BP: ()/()   Imaging Review Plain radiographs demonstrate severe degenerative joint disease of the left hip. The bone quality appears to be adequate for age and reported activity level.  Assessment/Plan:  End stage arthritis, left hip  The patient history, physical examination, clinical judgement of the provider and imaging studies are consistent with end stage degenerative joint disease of the left hip and total hip arthroplasty is deemed medically necessary. The treatment options including medical management, injection therapy, arthroscopy and arthroplasty were discussed at length. The risks and benefits of total hip arthroplasty were presented and reviewed. The risks due to aseptic loosening, infection, stiffness, dislocation/subluxation,  thromboembolic complications and other imponderables were discussed. The patient acknowledged the explanation, agreed to proceed with the plan and consent was signed. Patient is being admitted for inpatient treatment for surgery, pain control, PT, OT, prophylactic antibiotics, VTE prophylaxis, progressive ambulation and ADLs and discharge planning.The patient is planning to be discharged  home .  Therapy Plans: HEP Disposition: Home with Wife Planned DVT Prophylaxis: Aspirin 81 mg BID DME Needed: None PCP: Jarome Matin, MD (clearance received) TXA: IV Allergies: buprion (feels worse) Anesthesia Concerns: uses CPAP BMI: 24.9 Last HgbA1c: not diabetic  Pharmacy: [facility])  Other: -issues with urinary retention post-op in 2019  - Patient was instructed on what medications to stop prior to surgery. - Follow-up visit in 2 weeks with Dr. Lequita Halt - Begin physical therapy following surgery - Pre-operative lab work as pre-surgical testing - Prescriptions will be provided in hospital at time of discharge  R. Arcola Jansky, PA-C Orthopedic Surgery [facility] [place]

## 2023-11-28 NOTE — Patient Instructions (Signed)
SURGICAL WAITING ROOM VISITATION  Patients having surgery or a procedure may have no more than 2 support people in the waiting area - these visitors may rotate.    Children under the age of 62 must have an adult with them who is not the patient.  Due to an increase in RSV and influenza rates and associated hospitalizations, children ages 39 and under may not visit patients in [facility] hospitals.  If the patient needs to stay at the hospital during part of their recovery, the visitor guidelines for inpatient rooms apply. Pre-op nurse will coordinate an appropriate time for 1 support person to accompany patient in pre-op.  This support person may not rotate.    Please refer to the [facility] website for the visitor guidelines for Inpatients (after your surgery is over and you are in a regular room).       Your procedure is scheduled on: 12/12/23   Report to [facility] Main Entrance    Report to admitting at  9 AM   Call this number if you have problems the morning of surgery [phone]   Do not eat food :After Midnight.   After Midnight you may have the following liquids until 8:35 AM DAY OF SURGERY  Water Non-Citrus Juices (without pulp, NO RED-Apple, White grape, White cranberry) Black Coffee (NO MILK/CREAM OR CREAMERS, sugar ok)  Clear Tea (NO MILK/CREAM OR CREAMERS, sugar ok) regular and decaf                             Plain Jell-O (NO RED)                                           Fruit ices (not with fruit pulp, NO RED)                                     Popsicles (NO RED)                                                               Sports drinks like Gatorade (NO RED)                The day of surgery:  Drink ONE (1) Pre-Surgery Clear Ensure 8:35 AM the morning of surgery. Drink in one sitting. Do not sip.  This drink was given to you during your hospital  pre-op appointment visit. Nothing else to drink after completing the  Pre-Surgery Clear Ensure         Oral Hygiene is also important to reduce your risk of infection.                                    Remember - BRUSH YOUR TEETH THE MORNING OF SURGERY WITH YOUR REGULAR TOOTHPASTE  DENTURES WILL BE REMOVED PRIOR TO SURGERY PLEASE DO NOT APPLY "Poly grip" OR ADHESIVES!!!   Do NOT smoke after Midnight   Stop all vitamins and herbal supplements 7 days  before surgery.   Take these medicines the morning of surgery with A SIP OF WATER: Allopurinol, Amlodipine, pepcid, metoprolol, pravastatin, tamsulosin  Bring CPAP mask and tubing day of surgery.                              You may not have any metal on your body including hair pins, jewelry, and body piercing             Do not wear make-up, lotions, powders, perfumes/cologne, or deodorant              Men may shave face and neck.   Do not bring valuables to the hospital. [facility] IS NOT             RESPONSIBLE   FOR VALUABLES.   Contacts, glasses, dentures or bridgework may not be worn into surgery.   Bring small overnight bag day of surgery.   DO NOT BRING YOUR HOME MEDICATIONS TO THE HOSPITAL. PHARMACY WILL DISPENSE MEDICATIONS LISTED ON YOUR MEDICATION LIST TO YOU DURING YOUR ADMISSION IN THE HOSPITAL!    Patients discharged on the day of surgery will not be allowed to drive home.  Someone NEEDS to stay with you for the first 24 hours after anesthesia.   Special Instructions: Bring a copy of your healthcare power of attorney and living will documents the day of surgery if you haven't scanned them before.              Please read over the following fact sheets you were given: IF YOU HAVE QUESTIONS ABOUT YOUR PRE-OP INSTRUCTIONS PLEASE CALL [phone] [facility]   If you received a COVID test during your pre-op visit  it is requested that you wear a mask when out in public, stay away from anyone that may not be feeling well and notify your surgeon if you develop symptoms. If you test positive for Covid or have been in  contact with anyone that has tested positive in the last 10 days please notify you surgeon.      Pre-operative 5 CHG Bath Instructions   You can play a key role in reducing the risk of infection after surgery. Your skin needs to be as free of germs as possible. You can reduce the number of germs on your skin by washing with CHG (chlorhexidine gluconate) soap before surgery. CHG is an antiseptic soap that kills germs and continues to kill germs even after washing.   DO NOT use if you have an allergy to chlorhexidine/CHG or antibacterial soaps. If your skin becomes reddened or irritated, stop using the CHG and notify one of our RNs at [phone].   Please shower with the CHG soap starting 4 days before surgery using the following schedule:     Please keep in mind the following:  DO NOT shave, including legs and underarms, starting the day of your first shower.   You may shave your face at any point before/day of surgery.  Place clean sheets on your bed the day you start using CHG soap. Use a clean washcloth (not used since being washed) for each shower. DO NOT sleep with pets once you start using the CHG.   CHG Shower Instructions:  If you choose to wash your hair and private area, wash first with your normal shampoo/soap.  After you use shampoo/soap, rinse your hair and body thoroughly to remove shampoo/soap residue.  Turn the water OFF and  apply about 3 tablespoons (45 ml) of CHG soap to a CLEAN washcloth.  Apply CHG soap ONLY FROM YOUR NECK DOWN TO YOUR TOES (washing for 3-5 minutes)  DO NOT use CHG soap on face, private areas, open wounds, or sores.  Pay special attention to the area where your surgery is being performed.  If you are having back surgery, having someone wash your back for you may be helpful. Wait 2 minutes after CHG soap is applied, then you may rinse off the CHG soap.  Pat dry with a clean towel  Put on clean clothes/pajamas   If you choose to wear lotion, please  use ONLY the CHG-compatible lotions on the back of this paper.     Additional instructions for the day of surgery: DO NOT APPLY any lotions, deodorants, cologne, or perfumes.   Put on clean/comfortable clothes.  Brush your teeth.  Ask your nurse before applying any prescription medications to the skin.      CHG Compatible Lotions   Aveeno Moisturizing lotion  Cetaphil Moisturizing Cream  Cetaphil Moisturizing Lotion  Clairol Herbal Essence Moisturizing Lotion, Dry Skin  Clairol Herbal Essence Moisturizing Lotion, Extra Dry Skin  Clairol Herbal Essence Moisturizing Lotion, Normal Skin  Curel Age Defying Therapeutic Moisturizing Lotion with Alpha Hydroxy  Curel Extreme Care Body Lotion  Curel Soothing Hands Moisturizing Hand Lotion  Curel Therapeutic Moisturizing Cream, Fragrance-Free  Curel Therapeutic Moisturizing Lotion, Fragrance-Free  Curel Therapeutic Moisturizing Lotion, Original Formula  Eucerin Daily Replenishing Lotion  Eucerin Dry Skin Therapy Plus Alpha Hydroxy Crme  Eucerin Dry Skin Therapy Plus Alpha Hydroxy Lotion  Eucerin Original Crme  Eucerin Original Lotion  Eucerin Plus Crme Eucerin Plus Lotion  Eucerin TriLipid Replenishing Lotion  Keri Anti-Bacterial Hand Lotion  Keri Deep Conditioning Original Lotion Dry Skin Formula Softly Scented  Keri Deep Conditioning Original Lotion, Fragrance Free Sensitive Skin Formula  Keri Lotion Fast Absorbing Fragrance Free Sensitive Skin Formula  Keri Lotion Fast Absorbing Softly Scented Dry Skin Formula  Keri Original Lotion  Keri Skin Renewal Lotion Keri Silky Smooth Lotion  Keri Silky Smooth Sensitive Skin Lotion  Nivea Body Creamy Conditioning Oil  Nivea Body Extra Enriched Lotion  Nivea Body Original Lotion  Nivea Body Sheer Moisturizing Lotion Nivea Crme  Nivea Skin Firming Lotion  NutraDerm 30 Skin Lotion  NutraDerm Skin Lotion  NutraDerm Therapeutic Skin Cream  NutraDerm Therapeutic Skin Lotion  ProShield  Protective Hand Cream  Incentive Spirometer (Watch this video at home: [email or web address])  An incentive spirometer is a tool that can help keep your lungs clear and active. This tool measures how well you are filling your lungs with each breath. Taking long deep breaths may help reverse or decrease the chance of developing breathing (pulmonary) problems (especially infection) following: A long period of time when you are unable to move or be active. BEFORE THE PROCEDURE  If the spirometer includes an indicator to show your best effort, your nurse or respiratory therapist will set it to a desired goal. If possible, sit up straight or lean slightly forward. Try not to slouch. Hold the incentive spirometer in an upright position. INSTRUCTIONS FOR USE  Sit on the edge of your bed if possible, or sit up as far as you can in bed or on a chair. Hold the incentive spirometer in an upright position. Breathe out normally. Place the mouthpiece in your mouth and seal your lips tightly around it. Breathe in slowly and as deeply as possible, raising the piston or the  ball toward the top of the column. Hold your breath for 3-5 seconds or for as long as possible. Allow the piston or ball to fall to the bottom of the column. Remove the mouthpiece from your mouth and breathe out normally. Rest for a few seconds and repeat Steps 1 through 7 at least 10 times every 1-2 hours when you are awake. Take your time and take a few normal breaths between deep breaths. The spirometer may include an indicator to show your best effort. Use the indicator as a goal to work toward during each repetition. After each set of 10 deep breaths, practice coughing to be sure your lungs are clear. If you have an incision (the cut made at the time of surgery), support your incision when coughing by placing a pillow or rolled up towels firmly against it. Once you are able to get out of bed, walk around indoors and  cough well. You may stop using the incentive spirometer when instructed by your caregiver.  RISKS AND COMPLICATIONS Take your time so you do not get dizzy or light-headed. If you are in pain, you may need to take or ask for pain medication before doing incentive spirometry. It is harder to take a deep breath if you are having pain. AFTER USE Rest and breathe slowly and easily. It can be helpful to keep track of a log of your progress. Your caregiver can provide you with a simple table to help with this. If you are using the spirometer at home, follow these instructions: SEEK MEDICAL CARE IF:  You are having difficultly using the spirometer. You have trouble using the spirometer as often as instructed. Your pain medication is not giving enough relief while using the spirometer. You develop fever of 100.5 F (38.1 C) or higher. SEEK IMMEDIATE MEDICAL CARE IF:  You cough up bloody sputum that had not been present before. You develop fever of 102 F (38.9 C) or greater. You develop worsening pain at or near the incision site. MAKE SURE YOU:  Understand these instructions. Will watch your condition. Will get help right away if you are not doing well or get worse. .  WHAT IS A BLOOD TRANSFUSION? Blood Transfusion Information  A transfusion is the replacement of blood or some of its parts. Blood is made up of multiple cells which provide different functions. Red blood cells carry oxygen and are used for blood loss replacement. White blood cells fight against infection. Platelets control bleeding. Plasma helps clot blood. Other blood products are available for specialized needs, such as hemophilia or other clotting disorders. BEFORE THE TRANSFUSION  Who gives blood for transfusions?  Healthy volunteers who are fully evaluated to make sure their blood is safe. This is blood bank blood. Transfusion therapy is the safest it has ever been in the practice of medicine. Before blood is taken  from a donor, a complete history is taken to make sure that person has no history of diseases nor engages in risky social behavior (examples are intravenous drug use or sexual activity with multiple partners). The donor's travel history is screened to minimize risk of transmitting infections, such as malaria. The donated blood is tested for signs of infectious diseases, such as HIV and hepatitis. The blood is then tested to be sure it is compatible with you in order to minimize the chance of a transfusion reaction. If you or a relative donates blood, this is often done in anticipation of surgery and is not appropriate for emergency  situations. It takes many days to process the donated blood. RISKS AND COMPLICATIONS Although transfusion therapy is very safe and saves many lives, the main dangers of transfusion include:  Getting an infectious disease. Developing a transfusion reaction. This is an allergic reaction to something in the blood you were given. Every precaution is taken to prevent this. The decision to have a blood transfusion has been considered carefully by your caregiver before blood is given. Blood is not given unless the benefits outweigh the risks. AFTER THE TRANSFUSION Right after receiving a blood transfusion, you will usually feel much better and more energetic. This is especially true if your red blood cells have gotten low (anemic). The transfusion raises the level of the red blood cells which carry oxygen, and this usually causes an energy increase. The nurse administering the transfusion will monitor you carefully for complications. HOME CARE INSTRUCTIONS  No special instructions are needed after a transfusion. You may find your energy is better. Speak with your caregiver about any limitations on activity for underlying diseases you may have. SEEK MEDICAL CARE IF:  Your condition is not improving after your transfusion. You develop redness or irritation at the intravenous (IV)  site. SEEK IMMEDIATE MEDICAL CARE IF:  Any of the following symptoms occur over the next 12 hours: Shaking chills. You have a temperature by mouth above 102 F (38.9 C), not controlled by medicine. Chest, back, or muscle pain. People around you feel you are not acting correctly or are confused. Shortness of breath or difficulty breathing. Dizziness and fainting. You get a rash or develop hives. You have a decrease in urine output. Your urine turns a dark color or changes to pink, red, or brown. Any of the following symptoms occur over the next 10 days: You have a temperature by mouth above 102 F (38.9 C), not controlled by medicine. Shortness of breath. Weakness after normal activity. The white part of the eye turns yellow (jaundice). You have a decrease in the amount of urine or are urinating less often. Your urine turns a dark color or changes to pink, red, or brown. Document Released: 12/10/2000 Document Revised: 03/06/2012 Document Reviewed: 07/29/2008 [facility] Patient Information 2014 [place], [place].

## 2023-11-28 NOTE — Progress Notes (Signed)
COVID Vaccine received: Yes Date of any COVID positive Test in last 90 days: No  PCP - Jarome Matin MD Cardiologist -   Chest x-ray -  EKG -  11/29/23 Epic Stress Test - 05/11/2001 Epic ECHO -  Cardiac Cath -   Bowel Prep - [x]  No  []   Yes ______  Pacemaker / ICD device [x]  No []  Yes   Spinal Cord Stimulator:[x]  No []  Yes       History of Sleep Apnea? []  No [x]  Yes   CPAP used?- []  No [x]  Yes    Does the patient monitor blood sugar?          [x]  No []  Yes  []  N/A  Patient has: [x]  NO Hx DM   []  Pre-DM                 []  DM1  []   DM2 Does patient have a [employer] or [employer]om? []  No []  Yes   Fasting Blood Sugar Ranges-  Checks Blood Sugar _____ times a day  GLP1 agonist / usual dose - no GLP1 instructions:  SGLT-2 inhibitors / usual dose - no SGLT-2 instructions:   Blood Thinner / Instructions:no Aspirin Instructions:no  Comments:   Activity level: Patient is able to climb a flight of stairs without difficulty; [x]  No CP  [x]  No SOB, _   Patient can  perform ADLs without assistance.   Anesthesia review:   Patient denies shortness of breath, fever, cough and chest pain at PAT appointment.  Patient verbalized understanding and agreement to the Pre-Surgical Instructions that were given to them at this PAT appointment. Patient was also educated of the need to review these PAT instructions again prior to his/her surgery.I reviewed the appropriate phone numbers to call if they have any and questions or concerns.

## 2023-11-29 ENCOUNTER — Encounter (HOSPITAL_COMMUNITY): Payer: Self-pay

## 2023-11-29 ENCOUNTER — Other Ambulatory Visit: Payer: Self-pay

## 2023-11-29 ENCOUNTER — Encounter (HOSPITAL_COMMUNITY)
Admission: RE | Admit: 2023-11-29 | Discharge: 2023-11-29 | Disposition: A | Discharge status: Home / Self Care | Payer: Medicare PPO | Source: Ambulatory Visit | Attending: Orthopedic Surgery | Admitting: Orthopedic Surgery

## 2023-11-29 VITALS — BP 151/80 | HR 82 | Temp 98.3°F | Resp 16 | Ht 67.0 in | Wt 160.0 lb

## 2023-11-29 DIAGNOSIS — I10 Essential (primary) hypertension: Secondary | ICD-10-CM | POA: Diagnosis not present

## 2023-11-29 DIAGNOSIS — Z01818 Encounter for other preprocedural examination: Secondary | ICD-10-CM | POA: Insufficient documentation

## 2023-11-29 HISTORY — DX: Cardiac arrhythmia, unspecified: I49.9

## 2023-11-29 HISTORY — DX: Malignant (primary) neoplasm, unspecified: C80.1

## 2023-11-29 LAB — CBC
HCT: 44.4 % (ref 39.0–52.0)
Hemoglobin: 14.2 g/dL (ref 13.0–17.0)
MCH: 29.9 pg (ref 26.0–34.0)
MCHC: 32 g/dL (ref 30.0–36.0)
MCV: 93.5 fL (ref 80.0–100.0)
Platelets: 236 10*3/uL (ref 150–400)
RBC: 4.75 MIL/uL (ref 4.22–5.81)
RDW: 12.5 % (ref 11.5–15.5)
WBC: 8.7 10*3/uL (ref 4.0–10.5)
nRBC: 0 % (ref 0.0–0.2)

## 2023-11-29 LAB — TYPE AND SCREEN
ABO/RH(D): A POS
Antibody Screen: NEGATIVE

## 2023-11-29 LAB — SURGICAL PCR SCREEN
MRSA, PCR: NEGATIVE
Staphylococcus aureus: NEGATIVE

## 2023-11-29 LAB — BASIC METABOLIC PANEL
Anion gap: 9 (ref 5–15)
BUN: 13 mg/dL (ref 8–23)
CO2: 21 mmol/L — ABNORMAL LOW (ref 22–32)
Calcium: 9.3 mg/dL (ref 8.9–10.3)
Chloride: 108 mmol/L (ref 98–111)
Creatinine, Ser: 0.75 mg/dL (ref 0.61–1.24)
GFR, Estimated: >60 mL/min (ref >60)
Glucose, Bld: 107 mg/dL — ABNORMAL HIGH (ref 70–99)
Potassium: 3.8 mmol/L (ref 3.5–5.1)
Sodium: 138 mmol/L (ref 135–145)

## 2023-12-12 ENCOUNTER — Ambulatory Visit (HOSPITAL_COMMUNITY): Payer: Medicare PPO

## 2023-12-12 ENCOUNTER — Ambulatory Visit (HOSPITAL_COMMUNITY): Payer: Medicare PPO | Admitting: Anesthesiology

## 2023-12-12 ENCOUNTER — Other Ambulatory Visit: Payer: Self-pay

## 2023-12-12 ENCOUNTER — Observation Stay (HOSPITAL_COMMUNITY): Payer: Medicare PPO

## 2023-12-12 ENCOUNTER — Observation Stay (HOSPITAL_COMMUNITY)
Admission: RE | Admit: 2023-12-12 | Discharge: 2023-12-13 | Disposition: A | Discharge status: Home / Self Care | Payer: Medicare PPO | Source: Ambulatory Visit | Attending: Orthopedic Surgery | Admitting: Orthopedic Surgery

## 2023-12-12 ENCOUNTER — Encounter (HOSPITAL_COMMUNITY): Payer: Self-pay | Admitting: Orthopedic Surgery

## 2023-12-12 ENCOUNTER — Encounter (HOSPITAL_COMMUNITY)
Admission: RE | Disposition: A | Discharge status: Home / Self Care | Payer: Self-pay | Source: Ambulatory Visit | Attending: Orthopedic Surgery

## 2023-12-12 DIAGNOSIS — Z96642 Presence of left artificial hip joint: Secondary | ICD-10-CM | POA: Diagnosis not present

## 2023-12-12 DIAGNOSIS — Z87891 Personal history of nicotine dependence: Secondary | ICD-10-CM | POA: Diagnosis not present

## 2023-12-12 DIAGNOSIS — Z96641 Presence of right artificial hip joint: Secondary | ICD-10-CM | POA: Diagnosis not present

## 2023-12-12 DIAGNOSIS — I10 Essential (primary) hypertension: Secondary | ICD-10-CM | POA: Diagnosis not present

## 2023-12-12 DIAGNOSIS — M1612 Unilateral primary osteoarthritis, left hip: Secondary | ICD-10-CM

## 2023-12-12 DIAGNOSIS — Z79899 Other long term (current) drug therapy: Secondary | ICD-10-CM | POA: Insufficient documentation

## 2023-12-12 DIAGNOSIS — Z96643 Presence of artificial hip joint, bilateral: Secondary | ICD-10-CM | POA: Diagnosis not present

## 2023-12-12 DIAGNOSIS — M169 Osteoarthritis of hip, unspecified: Principal | ICD-10-CM | POA: Diagnosis present

## 2023-12-12 HISTORY — PX: TOTAL HIP ARTHROPLASTY: SHX124

## 2023-12-12 SURGERY — ARTHROPLASTY, HIP, TOTAL, ANTERIOR APPROACH
Anesthesia: Spinal | Site: Hip | Laterality: Left

## 2023-12-12 MED ORDER — PHENYLEPHRINE 80 MCG/ML (10ML) SYRINGE FOR IV PUSH (FOR BLOOD PRESSURE SUPPORT)
PREFILLED_SYRINGE | INTRAVENOUS | Status: AC
  Filled 2023-12-12: qty 10

## 2023-12-12 MED ORDER — PHENOL 1.4 % MT LIQD: 1.0000 | OROMUCOSAL | Status: DC | PRN

## 2023-12-12 MED ORDER — MEPERIDINE HCL 50 MG/ML IJ SOLN
6.2500 mg | INTRAMUSCULAR | Status: DC | PRN
Start: 2023-12-12 — End: 2023-12-12

## 2023-12-12 MED ORDER — TAMSULOSIN HCL 0.4 MG PO CAPS
0.4000 mg | ORAL_CAPSULE | Freq: Every day | ORAL | Status: DC
  Administered 2023-12-13: 0.4 mg via ORAL
  Filled 2023-12-12: qty 1

## 2023-12-12 MED ORDER — METHOCARBAMOL 500 MG PO TABS
500.0000 mg | ORAL_TABLET | Freq: Four times a day (QID) | ORAL | Status: DC | PRN
Start: 2023-12-12 — End: 2023-12-13
  Administered 2023-12-12 – 2023-12-13 (×3): 500 mg via ORAL
  Filled 2023-12-12 (×3): qty 1

## 2023-12-12 MED ORDER — PHENYLEPHRINE HCL-NACL 20-0.9 MG/250ML-% IV SOLN
INTRAVENOUS | Status: DC | PRN
  Administered 2023-12-12: 40 ug/min via INTRAVENOUS

## 2023-12-12 MED ORDER — PHENYLEPHRINE HCL-NACL 20-0.9 MG/250ML-% IV SOLN
INTRAVENOUS | Status: AC
  Filled 2023-12-12: qty 250

## 2023-12-12 MED ORDER — ALLOPURINOL 300 MG PO TABS
300.0000 mg | ORAL_TABLET | Freq: Every day | ORAL | Status: DC
  Administered 2023-12-13: 300 mg via ORAL
  Filled 2023-12-12: qty 1

## 2023-12-12 MED ORDER — MAGNESIUM CITRATE PO SOLN: 1.0000 | Freq: Once | ORAL | Status: DC | PRN

## 2023-12-12 MED ORDER — LACTATED RINGERS IV SOLN: INTRAVENOUS | Status: DC

## 2023-12-12 MED ORDER — HYDROMORPHONE HCL 1 MG/ML IJ SOLN
INTRAMUSCULAR | Status: AC
  Filled 2023-12-12: qty 1

## 2023-12-12 MED ORDER — POLYETHYLENE GLYCOL 3350 17 G PO PACK: 17.0000 g | PACK | Freq: Every day | ORAL | Status: DC | PRN

## 2023-12-12 MED ORDER — HYDROMORPHONE HCL 1 MG/ML IJ SOLN
0.2500 mg | INTRAMUSCULAR | Status: DC | PRN
  Administered 2023-12-12: 0.25 mg via INTRAVENOUS

## 2023-12-12 MED ORDER — BUPIVACAINE-EPINEPHRINE 0.25% -1:200000 IJ SOLN
INTRAMUSCULAR | Status: AC
  Filled 2023-12-12: qty 1

## 2023-12-12 MED ORDER — ACETAMINOPHEN 10 MG/ML IV SOLN
1000.0000 mg | Freq: Four times a day (QID) | INTRAVENOUS | Status: DC
  Administered 2023-12-12: 1000 mg via INTRAVENOUS
  Filled 2023-12-12: qty 100

## 2023-12-12 MED ORDER — HYDROCODONE-ACETAMINOPHEN 5-325 MG PO TABS
1.0000 | ORAL_TABLET | ORAL | Status: DC | PRN
  Administered 2023-12-12 – 2023-12-13 (×3): 2 via ORAL
  Filled 2023-12-12 (×3): qty 2

## 2023-12-12 MED ORDER — TRANEXAMIC ACID-NACL 1000-0.7 MG/100ML-% IV SOLN
1000.0000 mg | INTRAVENOUS | Status: AC
  Administered 2023-12-12: 1000 mg via INTRAVENOUS
  Filled 2023-12-12: qty 100

## 2023-12-12 MED ORDER — 0.9 % SODIUM CHLORIDE (POUR BTL) OPTIME
TOPICAL | Status: DC | PRN
  Administered 2023-12-12: 1000 mL

## 2023-12-12 MED ORDER — PROPOFOL 500 MG/50ML IV EMUL
INTRAVENOUS | Status: AC
  Filled 2023-12-12: qty 50

## 2023-12-12 MED ORDER — IPRATROPIUM BROMIDE 0.06 % NA SOLN
2.0000 | Freq: Every day | NASAL | Status: DC
Start: 2023-12-13 — End: 2023-12-13
  Filled 2023-12-12: qty 15

## 2023-12-12 MED ORDER — DOCUSATE SODIUM 100 MG PO CAPS
100.0000 mg | ORAL_CAPSULE | Freq: Two times a day (BID) | ORAL | Status: DC
  Administered 2023-12-12 – 2023-12-13 (×2): 100 mg via ORAL
  Filled 2023-12-12 (×2): qty 1

## 2023-12-12 MED ORDER — AMLODIPINE BESYLATE 5 MG PO TABS
5.0000 mg | ORAL_TABLET | Freq: Every day | ORAL | Status: DC
  Administered 2023-12-13: 5 mg via ORAL
  Filled 2023-12-12: qty 1

## 2023-12-12 MED ORDER — ACETAMINOPHEN 325 MG PO TABS: 325.0000 mg | ORAL_TABLET | Freq: Four times a day (QID) | ORAL | Status: DC | PRN

## 2023-12-12 MED ORDER — ACETAMINOPHEN 325 MG PO TABS
325.0000 mg | ORAL_TABLET | Freq: Once | ORAL | Status: DC | PRN
Start: 2023-12-12 — End: 2023-12-12

## 2023-12-12 MED ORDER — HYDROCODONE-ACETAMINOPHEN 7.5-325 MG PO TABS
1.0000 | ORAL_TABLET | ORAL | Status: DC | PRN
  Administered 2023-12-13: 2 via ORAL
  Administered 2023-12-13 (×2): 1 via ORAL
  Filled 2023-12-12 (×2): qty 1
  Filled 2023-12-12: qty 2

## 2023-12-12 MED ORDER — ORAL CARE MOUTH RINSE: 15.0000 mL | Freq: Once | OROMUCOSAL | Status: AC

## 2023-12-12 MED ORDER — BISACODYL 10 MG RE SUPP: 10.0000 mg | Freq: Every day | RECTAL | Status: DC | PRN

## 2023-12-12 MED ORDER — CEFAZOLIN SODIUM-DEXTROSE 2-4 GM/100ML-% IV SOLN
2.0000 g | INTRAVENOUS | Status: AC
  Administered 2023-12-12: 2 g via INTRAVENOUS
  Filled 2023-12-12: qty 100

## 2023-12-12 MED ORDER — METOCLOPRAMIDE HCL 5 MG/ML IJ SOLN
5.0000 mg | Freq: Three times a day (TID) | INTRAMUSCULAR | Status: DC | PRN
  Administered 2023-12-12: 10 mg via INTRAVENOUS
  Filled 2023-12-12: qty 2

## 2023-12-12 MED ORDER — BUPIVACAINE-EPINEPHRINE (PF) 0.25% -1:200000 IJ SOLN
INTRAMUSCULAR | Status: DC | PRN
  Administered 2023-12-12: 30 mL via PERINEURAL

## 2023-12-12 MED ORDER — DEXAMETHASONE SODIUM PHOSPHATE 10 MG/ML IJ SOLN
INTRAMUSCULAR | Status: AC
  Filled 2023-12-12: qty 1

## 2023-12-12 MED ORDER — ONDANSETRON HCL 4 MG/2ML IJ SOLN
4.0000 mg | Freq: Four times a day (QID) | INTRAMUSCULAR | Status: DC | PRN
Start: 2023-12-12 — End: 2023-12-13

## 2023-12-12 MED ORDER — WATER FOR IRRIGATION, STERILE IR SOLN
Status: DC | PRN
  Administered 2023-12-12: 1000 mL

## 2023-12-12 MED ORDER — CEFAZOLIN SODIUM-DEXTROSE 2-4 GM/100ML-% IV SOLN
2.0000 g | Freq: Four times a day (QID) | INTRAVENOUS | Status: AC
  Administered 2023-12-12 – 2023-12-13 (×2): 2 g via INTRAVENOUS
  Filled 2023-12-12 (×2): qty 100

## 2023-12-12 MED ORDER — PROPOFOL 500 MG/50ML IV EMUL
INTRAVENOUS | Status: DC | PRN
  Administered 2023-12-12: 80 ug/kg/min via INTRAVENOUS

## 2023-12-12 MED ORDER — ACETAMINOPHEN 10 MG/ML IV SOLN: 1000.0000 mg | Freq: Once | INTRAVENOUS | Status: DC | PRN

## 2023-12-12 MED ORDER — METOCLOPRAMIDE HCL 5 MG PO TABS: 5.0000 mg | ORAL_TABLET | Freq: Three times a day (TID) | ORAL | Status: DC | PRN

## 2023-12-12 MED ORDER — DROPERIDOL 2.5 MG/ML IJ SOLN: 0.6250 mg | Freq: Once | INTRAMUSCULAR | Status: DC | PRN

## 2023-12-12 MED ORDER — POVIDONE-IODINE 10 % EX SWAB: 2.0000 | Freq: Once | CUTANEOUS | Status: DC

## 2023-12-12 MED ORDER — IRBESARTAN 150 MG PO TABS
300.0000 mg | ORAL_TABLET | Freq: Every day | ORAL | Status: DC
  Administered 2023-12-13: 300 mg via ORAL
  Filled 2023-12-12: qty 2

## 2023-12-12 MED ORDER — METHOCARBAMOL 1000 MG/10ML IJ SOLN: 500.0000 mg | Freq: Four times a day (QID) | INTRAMUSCULAR | Status: DC | PRN

## 2023-12-12 MED ORDER — ASPIRIN 81 MG PO CHEW
81.0000 mg | CHEWABLE_TABLET | Freq: Two times a day (BID) | ORAL | Status: DC
  Administered 2023-12-13: 81 mg via ORAL
  Filled 2023-12-12: qty 1

## 2023-12-12 MED ORDER — PROPOFOL 10 MG/ML IV BOLUS
INTRAVENOUS | Status: DC | PRN
  Administered 2023-12-12 (×3): 50 mg via INTRAVENOUS

## 2023-12-12 MED ORDER — METOPROLOL SUCCINATE ER 25 MG PO TB24
12.5000 mg | ORAL_TABLET | Freq: Every day | ORAL | Status: DC
  Administered 2023-12-13: 12.5 mg via ORAL
  Filled 2023-12-12: qty 1

## 2023-12-12 MED ORDER — LACTATED RINGERS IV SOLN: INTRAVENOUS | Status: DC | PRN

## 2023-12-12 MED ORDER — SODIUM CHLORIDE 0.9 % IV SOLN
INTRAVENOUS | Status: DC
Start: 2023-12-12 — End: 2023-12-13

## 2023-12-12 MED ORDER — DEXAMETHASONE SODIUM PHOSPHATE 10 MG/ML IJ SOLN
8.0000 mg | Freq: Once | INTRAMUSCULAR | Status: AC
  Administered 2023-12-12: 10 mg via INTRAVENOUS

## 2023-12-12 MED ORDER — CHLORHEXIDINE GLUCONATE 0.12 % MT SOLN
15.0000 mL | Freq: Once | OROMUCOSAL | Status: AC
  Administered 2023-12-12: 15 mL via OROMUCOSAL

## 2023-12-12 MED ORDER — MENTHOL 3 MG MT LOZG: 1.0000 | LOZENGE | OROMUCOSAL | Status: DC | PRN

## 2023-12-12 MED ORDER — DEXAMETHASONE SODIUM PHOSPHATE 10 MG/ML IJ SOLN
10.0000 mg | Freq: Once | INTRAMUSCULAR | Status: AC
Start: 2023-12-13 — End: 2023-12-13
  Administered 2023-12-13: 10 mg via INTRAVENOUS
  Filled 2023-12-12: qty 1

## 2023-12-12 MED ORDER — ONDANSETRON HCL 4 MG/2ML IJ SOLN
INTRAMUSCULAR | Status: DC | PRN
  Administered 2023-12-12: 4 mg via INTRAVENOUS

## 2023-12-12 MED ORDER — TRAMADOL HCL 50 MG PO TABS
50.0000 mg | ORAL_TABLET | Freq: Four times a day (QID) | ORAL | Status: DC
  Administered 2023-12-12 – 2023-12-13 (×3): 50 mg via ORAL
  Filled 2023-12-12 (×3): qty 1

## 2023-12-12 MED ORDER — MORPHINE SULFATE (PF) 2 MG/ML IV SOLN
0.5000 mg | INTRAVENOUS | Status: DC | PRN
  Administered 2023-12-12: 1 mg via INTRAVENOUS
  Filled 2023-12-12: qty 1

## 2023-12-12 MED ORDER — ONDANSETRON HCL 4 MG/2ML IJ SOLN
INTRAMUSCULAR | Status: AC
  Filled 2023-12-12: qty 2

## 2023-12-12 MED ORDER — ACETAMINOPHEN 160 MG/5ML PO SOLN
325.0000 mg | Freq: Once | ORAL | Status: DC | PRN
Start: 2023-12-12 — End: 2023-12-12

## 2023-12-12 MED ORDER — ONDANSETRON HCL 4 MG PO TABS: 4.0000 mg | ORAL_TABLET | Freq: Four times a day (QID) | ORAL | Status: DC | PRN

## 2023-12-12 MED ORDER — BUPIVACAINE IN DEXTROSE 0.75-8.25 % IT SOLN
INTRATHECAL | Status: DC | PRN
  Administered 2023-12-12: 2 mL via INTRATHECAL

## 2023-12-12 MED ORDER — PRAVASTATIN SODIUM 20 MG PO TABS
20.0000 mg | ORAL_TABLET | Freq: Every day | ORAL | Status: DC
  Administered 2023-12-13: 20 mg via ORAL
  Filled 2023-12-12: qty 1

## 2023-12-12 SURGICAL SUPPLY — 36 items
BAG COUNTER SPONGE SURGICOUNT (BAG) IMPLANT
BAG ZIPLOCK 12X15 (MISCELLANEOUS) IMPLANT
BALL HIP ARTICU EZE 36 8.5 (Hips) IMPLANT
BLADE SAG 18X100X1.27 (BLADE) ×1 IMPLANT
COVER PERINEAL POST (MISCELLANEOUS) ×1 IMPLANT
COVER SURGICAL LIGHT HANDLE (MISCELLANEOUS) ×1 IMPLANT
CUP ACETBLR 54 OD PINNACLE (Hips) IMPLANT
DERMABOND ADVANCED .7 DNX12 (GAUZE/BANDAGES/DRESSINGS) ×1 IMPLANT
DRAPE FOOT SWITCH (DRAPES) ×1 IMPLANT
DRAPE STERI IOBAN 125X83 (DRAPES) ×1 IMPLANT
DRAPE U-SHAPE 47X51 STRL (DRAPES) ×2 IMPLANT
DRSG AQUACEL AG ADV 3.5X10 (GAUZE/BANDAGES/DRESSINGS) ×1 IMPLANT
DURAPREP 26ML APPLICATOR (WOUND CARE) ×1 IMPLANT
ELECT REM PT RETURN 15FT ADLT (MISCELLANEOUS) ×1 IMPLANT
GLOVE BIO SURGEON STRL SZ 6.5 (GLOVE) IMPLANT
GLOVE BIO SURGEON STRL SZ8 (GLOVE) ×1 IMPLANT
GLOVE BIOGEL PI IND STRL 6.5 (GLOVE) IMPLANT
GLOVE BIOGEL PI IND STRL 7.0 (GLOVE) IMPLANT
GLOVE BIOGEL PI IND STRL 8 (GLOVE) ×1 IMPLANT
GOWN STRL REUS W/ TWL LRG LVL3 (GOWN DISPOSABLE) ×1 IMPLANT
HIP BALL ARTICU EZE 36 8.5 (Hips) ×1 IMPLANT
HOLDER FOLEY CATH W/STRAP (MISCELLANEOUS) ×1 IMPLANT
KIT TURNOVER KIT A (KITS) IMPLANT
LINER MARATHON NEUT +4X54X36 (Hips) IMPLANT
MANIFOLD NEPTUNE II (INSTRUMENTS) ×1 IMPLANT
PACK ANTERIOR HIP CUSTOM (KITS) ×1 IMPLANT
PENCIL SMOKE EVACUATOR COATED (MISCELLANEOUS) ×1 IMPLANT
SPIKE FLUID TRANSFER (MISCELLANEOUS) ×1 IMPLANT
STEM FEMORAL SZ6 HIGH ACTIS (Stem) IMPLANT
SUT ETHIBOND NAB CT1 #1 30IN (SUTURE) ×1 IMPLANT
SUT MNCRL AB 4-0 PS2 18 (SUTURE) ×1 IMPLANT
SUT STRATAFIX 0 PDS 27 VIOLET (SUTURE) ×1
SUT VIC AB 2-0 CT1 TAPERPNT 27 (SUTURE) ×2 IMPLANT
SUTURE STRATFX 0 PDS 27 VIOLET (SUTURE) ×1 IMPLANT
TRAY FOLEY MTR SLVR 16FR STAT (SET/KITS/TRAYS/PACK) ×1 IMPLANT
TUBE SUCTION HIGH CAP CLEAR NV (SUCTIONS) ×1 IMPLANT

## 2023-12-12 NOTE — Progress Notes (Signed)
   12/12/23 2333  BiPAP/CPAP/SIPAP  $ Non-Invasive Home Ventilator  Initial  BiPAP/CPAP/SIPAP Pt Type Adult  BiPAP/CPAP/SIPAP DREAMSTATIOND  Mask Type Full face mask  Respiratory Rate 18 breaths/min  EPAP 4 cmH2O  Patient Home Equipment No  Auto Titrate No

## 2023-12-12 NOTE — Anesthesia Procedure Notes (Signed)
Spinal  Start time: 12/12/2023 11:44 AM End time: 12/12/2023 11:46 AM Reason for block: surgical anesthesia Staffing Performed: anesthesiologist  Anesthesiologist: Shelton Silvas, MD Performed by: Shelton Silvas, MD Authorized by: Shelton Silvas, MD   Preanesthetic Checklist Completed: patient identified, IV checked, site marked, risks and benefits discussed, surgical consent, monitors and equipment checked, pre-op evaluation and timeout performed Spinal Block Patient position: sitting Prep: DuraPrep and site prepped and draped Location: L3-4 Injection technique: single-shot Needle Needle type: Pencan  Needle gauge: 24 G Needle length: 10 cm Needle insertion depth: 10 cm Additional Notes Patient tolerated well. No immediate complications.  Functioning IV was confirmed and monitors were applied. Sterile prep and drape, including hand hygiene and sterile gloves were used. The patient was positioned and the back was prepped. The skin was anesthetized with lidocaine. Free flow of clear CSF was obtained prior to injecting local anesthetic into the CSF. The spinal needle aspirated freely following injection. The needle was carefully withdrawn. The patient tolerated the procedure well.

## 2023-12-12 NOTE — Anesthesia Preprocedure Evaluation (Signed)
Anesthesia Evaluation  Patient identified by MRN, date of birth, ID band Patient awake    Reviewed: Allergy & Precautions, NPO status , Patient's Chart, lab work & pertinent test results, reviewed documented beta blocker date and time   Airway Mallampati: II  TM Distance: >3 FB Neck ROM: Full    Dental  (+) Teeth Intact, Dental Advisory Given   Pulmonary sleep apnea , COPD, former smoker   breath sounds clear to auscultation       Cardiovascular hypertension, Pt. on medications and Pt. on home beta blockers + dysrhythmias  Rhythm:Regular Rate:Normal     Neuro/Psych negative neurological ROS  negative psych ROS   GI/Hepatic Neg liver ROS,GERD  Medicated,,  Endo/Other  negative endocrine ROS    Renal/GU Renal disease     Musculoskeletal  (+) Arthritis ,    Abdominal   Peds  Hematology negative hematology ROS (+)   Anesthesia Other Findings   Reproductive/Obstetrics                             Anesthesia Physical Anesthesia Plan  ASA: 3  Anesthesia Plan: Spinal   Post-op Pain Management: Tylenol PO (pre-op)*   Induction: Intravenous  PONV Risk Score and Plan: 2 and Ondansetron and Propofol infusion  Airway Management Planned: Natural Airway and Nasal Cannula  Additional Equipment: None  Intra-op Plan:   Post-operative Plan:   Informed Consent: I have reviewed the patients History and Physical, chart, labs and discussed the procedure including the risks, benefits and alternatives for the proposed anesthesia with the patient or authorized representative who has indicated his/her understanding and acceptance.       Plan Discussed with: CRNA  Anesthesia Plan Comments: (Lab Results      Component                Value               Date                      WBC                      8.7                 11/29/2023                HGB                      14.2                 11/29/2023                HCT                      44.4                11/29/2023                MCV                      93.5                11/29/2023                PLT  236                 11/29/2023           )       Anesthesia Quick Evaluation

## 2023-12-12 NOTE — Progress Notes (Signed)
Orthopedic Tech Progress Note Patient Details:  John Potts [birth year] [id number]  Patient ID: [name], male   DOB: October 29, 1946, 77 y.o.   MRN: [id number] No overhead frame, pt exceeds weight limit. Darleen Crocker 12/12/2023, 3:08 PM

## 2023-12-12 NOTE — Anesthesia Postprocedure Evaluation (Signed)
Anesthesia Post Note  Patient: John Potts  Procedure(s) Performed: TOTAL HIP ARTHROPLASTY ANTERIOR APPROACH (Left: Hip)     Patient location during evaluation: PACU Anesthesia Type: Spinal Level of consciousness: oriented and awake and alert Pain management: pain level controlled Vital Signs Assessment: post-procedure vital signs reviewed and stable Respiratory status: spontaneous breathing, respiratory function stable and patient connected to nasal cannula oxygen Cardiovascular status: blood pressure returned to baseline and stable Postop Assessment: no headache, no backache and no apparent nausea or vomiting Anesthetic complications: no  No notable events documented.  Last Vitals:  Vitals:   12/12/23 1445 12/12/23 1512  BP: 124/70 (!) 141/80  Pulse: 81 87  Resp: 14 16  Temp: (!) 36.3 C   SpO2: 100% 98%    Last Pain:  Vitals:   12/12/23 1512  TempSrc: Oral  PainSc: 4                  Shelton Silvas

## 2023-12-12 NOTE — Interval H&P Note (Signed)
History and Physical Interval Note:  12/12/2023 9:35 AM  John Potts  has presented today for surgery, with the diagnosis of left hip osteoarthritis.  The various methods of treatment have been discussed with the patient and family. After consideration of risks, benefits and other options for treatment, the patient has consented to  Procedure(s): TOTAL HIP ARTHROPLASTY ANTERIOR APPROACH (Left) as a surgical intervention.  The patient's history has been reviewed, patient examined, no change in status, stable for surgery.  I have reviewed the patient's chart and labs.  Questions were answered to the patient's satisfaction.     Homero Fellers Akiya Morr

## 2023-12-12 NOTE — Discharge Instructions (Addendum)
John Gross, MD Total Joint Specialist [facility] [place] [address] [place], [place] [address] [phone]  ANTERIOR APPROACH TOTAL HIP REPLACEMENT POSTOPERATIVE DIRECTIONS     Hip Rehabilitation, Guidelines Following Surgery  The results of a hip operation are greatly improved after range of motion and muscle strengthening exercises. Follow all safety measures which are given to protect your hip. If any of these exercises cause increased pain or swelling in your joint, decrease the amount until you are comfortable again. Then slowly increase the exercises. Call your caregiver if you have problems or questions.   BLOOD CLOT PREVENTION Take 81 mg Aspirin two times a day for three weeks following surgery. Then take an 81 mg Aspirin once a day for three weeks. Then discontinue Aspirin. You may resume your vitamins/supplements upon discharge from the hospital. Do not take any NSAIDs (Advil, Aleve, Ibuprofen, Meloxicam, etc.) for 3 weeks, while taking 81mg  Aspirin twice a day.   HOME CARE INSTRUCTIONS  Remove items at home which could result in a fall. This includes throw rugs or furniture in walking pathways.  ICE to the affected hip as frequently as 20-30 minutes an hour and then as needed for pain and swelling. Continue to use ice on the hip for pain and swelling from surgery. You may notice swelling that will progress down to the foot and ankle. This is normal after surgery. Elevate the leg when you are not up walking on it.   Continue to use the breathing machine which will help keep your temperature down.  It is common for your temperature to cycle up and down following surgery, especially at night when you are not up moving around and exerting yourself.  The breathing machine keeps your lungs expanded and your temperature down.  DIET You may resume your previous home diet once your are discharged from the hospital.  DRESSING / WOUND CARE / SHOWERING You  have an adhesive waterproof bandage over the incision. Leave this in place until your first follow-up appointment. Once you remove this you will not need to place another bandage.  You may begin showering 3 days following surgery, but do not submerge the incision under water.  ACTIVITY For the first 3-5 days, it is important to rest and keep the operative leg elevated. You should, as a general rule, rest for 50 minutes and walk/stretch for 10 minutes per hour. After 5 days, you may slowly increase activity as tolerated.  Perform the exercises you were provided twice a day for about 15-20 minutes each session. Begin these 2 days following surgery. Walk with your walker as instructed. Use the walker until you are comfortable transitioning to a cane. Walk with the cane in the opposite hand of the operative leg. You may discontinue the cane once you are comfortable and walking steadily. Avoid periods of inactivity such as sitting longer than an hour when not asleep. This helps prevent blood clots.  Do not drive a car for 6 weeks or until released by your surgeon.  Do not drive while taking narcotics.  TED HOSE STOCKINGS Wear the elastic stockings on both legs for three weeks following surgery during the day. You may remove them at night while sleeping.  WEIGHT BEARING Weight bearing as tolerated with assist device (walker, cane, etc) as directed, use it as long as suggested by your surgeon or therapist, typically at least 4-6 weeks.  POSTOPERATIVE CONSTIPATION PROTOCOL Constipation - defined medically as fewer than three stools per week and severe  constipation as less than one stool per week.  One of the most common issues patients have following surgery is constipation.  Even if you have a regular bowel pattern at home, your normal regimen is likely to be disrupted due to multiple reasons following surgery.  Combination of anesthesia, postoperative narcotics, change in appetite and fluid intake all  can affect your bowels.  In order to avoid complications following surgery, here are some recommendations in order to help you during your recovery period.  Colace (docusate) - Pick up an over-the-counter form of Colace or another stool softener and take twice a day as long as you are requiring postoperative pain medications.  Take with a full glass of water daily.  If you experience loose stools or diarrhea, hold the colace until you stool forms back up.  If your symptoms do not get better within 1 week or if they get worse, check with your doctor. Dulcolax (bisacodyl) - Pick up over-the-counter and take as directed by the product packaging as needed to assist with the movement of your bowels.  Take with a full glass of water.  Use this product as needed if not relieved by Colace only.  MiraLax (polyethylene glycol) - Pick up over-the-counter to have on hand.  MiraLax is a solution that will increase the amount of water in your bowels to assist with bowel movements.  Take as directed and can mix with a glass of water, juice, soda, coffee, or tea.  Take if you go more than two days without a movement.Do not use MiraLax more than once per day. Call your doctor if you are still constipated or irregular after using this medication for 7 days in a row.  If you continue to have problems with postoperative constipation, please contact the office for further assistance and recommendations.  If you experience "the worst abdominal pain ever" or develop nausea or vomiting, please contact the office immediatly for further recommendations for treatment.  ITCHING  If you experience itching with your medications, try taking only a single pain pill, or even half a pain pill at a time.  You can also use Benadryl over the counter for itching or also to help with sleep.   MEDICATIONS See your medication summary on the "After Visit Summary" that the nursing staff will review with you prior to discharge.  You may have some  home medications which will be placed on hold until you complete the course of blood thinner medication.  It is important for you to complete the blood thinner medication as prescribed by your surgeon.  Continue your approved medications as instructed at time of discharge.  PRECAUTIONS If you experience chest pain or shortness of breath - call 911 immediately for transfer to the hospital emergency department.  If you develop a fever greater that 101 F, purulent drainage from wound, increased redness or drainage from wound, foul odor from the wound/dressing, or calf pain - CONTACT YOUR SURGEON.                                                   FOLLOW-UP APPOINTMENTS Make sure you keep all of your appointments after your operation with your surgeon and caregivers. You should call the office at the above phone number and make an appointment for approximately two weeks after the date of your surgery or  on the date instructed by your surgeon outlined in the "After Visit Summary".  RANGE OF MOTION AND STRENGTHENING EXERCISES  These exercises are designed to help you keep full movement of your hip joint. Follow your caregiver's or physical therapist's instructions. Perform all exercises about fifteen times, three times per day or as directed. Exercise both hips, even if you have had only one joint replacement. These exercises can be done on a training (exercise) mat, on the floor, on a table or on a bed. Use whatever works the best and is most comfortable for you. Use music or television while you are exercising so that the exercises are a pleasant break in your day. This will make your life better with the exercises acting as a break in routine you can look forward to.  Lying on your back, slowly slide your foot toward your buttocks, raising your knee up off the floor. Then slowly slide your foot back down until your leg is straight again.  Lying on your back spread your legs as far apart as you can without  causing discomfort.  Lying on your side, raise your upper leg and foot straight up from the floor as far as is comfortable. Slowly lower the leg and repeat.  Lying on your back, tighten up the muscle in the front of your thigh (quadriceps muscles). You can do this by keeping your leg straight and trying to raise your heel off the floor. This helps strengthen the largest muscle supporting your knee.  Lying on your back, tighten up the muscles of your buttocks both with the legs straight and with the knee bent at a comfortable angle while keeping your heel on the floor.   POST-OPERATIVE OPIOID TAPER INSTRUCTIONS: It is important to wean off of your opioid medication as soon as possible. If you do not need pain medication after your surgery it is ok to stop day one. Opioids include: Codeine, Hydrocodone(Norco, Vicodin), Oxycodone(Percocet, oxycontin) and hydromorphone amongst others.  Long term and even short term use of opiods can cause: Increased pain response Dependence Constipation Depression Respiratory depression And more.  Withdrawal symptoms can include Flu like symptoms Nausea, vomiting And more Techniques to manage these symptoms Hydrate well Eat regular healthy meals Stay active Use relaxation techniques(deep breathing, meditating, yoga) Do Not substitute Alcohol to help with tapering If you have been on opioids for less than two weeks and do not have pain than it is ok to stop all together.  Plan to wean off of opioids This plan should start within one week post op of your joint replacement. Maintain the same interval or time between taking each dose and first decrease the dose.  Cut the total daily intake of opioids by one tablet each day Next start to increase the time between doses. The last dose that should be eliminated is the evening dose.   IF YOU ARE TRANSFERRED TO A SKILLED REHAB FACILITY If the patient is transferred to a skilled rehab facility following release  from the hospital, a list of the current medications will be sent to the facility for the patient to continue.  When discharged from the skilled rehab facility, please have the facility set up the patient's Home Health Physical Therapy prior to being released. Also, the skilled facility will be responsible for providing the patient with their medications at time of release from the facility to include their pain medication, the muscle relaxants, and their blood thinner medication. If the patient is still at the  rehab facility at time of the two week follow up appointment, the skilled rehab facility will also need to assist the patient in arranging follow up appointment in our office and any transportation needs.  MAKE SURE YOU:  Understand these instructions.  Get help right away if you are not doing well or get worse.    DENTAL ANTIBIOTICS:  In most cases prophylactic antibiotics for Dental procdeures after total joint surgery are not necessary.  Exceptions are as follows:  1. History of prior total joint infection  2. Severely immunocompromised (Organ Transplant, cancer chemotherapy, Rheumatoid biologic meds such as Humera)  3. Poorly controlled diabetes (A1C &gt; 8.0, blood glucose over 200)  If you have one of these conditions, contact your surgeon for an antibiotic prescription, prior to your dental procedure.    Pick up stool softner and laxative for home use following surgery while on pain medications. Do not submerge incision under water. Please use good hand washing techniques while changing dressing each day. May shower starting three days after surgery. Please use a clean towel to pat the incision dry following showers. Continue to use ice for pain and swelling after surgery. Do not use any lotions or creams on the incision until instructed by your surgeon.

## 2023-12-12 NOTE — Progress Notes (Signed)
RT setup CPAP for patient he requested a pressure of 4 said he uses the lowest pressure just for comfort.

## 2023-12-12 NOTE — Care Plan (Signed)
Ortho Bundle Case Management Note  Patient Details  Name: HANZ JECK MRN: [id number] Date of Birth: 1946/08/08                  L THA on 12-12-23  DCP: Home with wife  DME: No needs, has a RW  PT: [facility]   DME Arranged:  N/A DME Agency:       Additional Comments: Please contact me with any questions of if this plan should need to change.   Ennis Forts, RN,CCM [facility]  [phone] 12/12/2023, 12:13 PM

## 2023-12-12 NOTE — Transfer of Care (Signed)
Immediate Anesthesia Transfer of Care Note  Patient: ADIS GRANGER  Procedure(s) Performed: TOTAL HIP ARTHROPLASTY ANTERIOR APPROACH (Left: Hip)  Patient Location: PACU  Anesthesia Type:MAC and Spinal  Level of Consciousness: awake  Airway & Oxygen Therapy: Patient Spontanous Breathing and Patient connected to face mask oxygen  Post-op Assessment: Report given to RN and Post -op Vital signs reviewed and stable  Post vital signs: Reviewed and stable  Last Vitals:  Vitals Value Taken Time  BP 92/59 12/12/23 1315  Temp    Pulse 81 12/12/23 1317  Resp 20 12/12/23 1317  SpO2 100 % 12/12/23 1317  Vitals shown include unfiled device data.  Last Pain:  Vitals:   12/12/23 0954  TempSrc: Oral  PainSc:       Patients Stated Pain Goal: 3 (12/12/23 0931)  Complications: No notable events documented.

## 2023-12-12 NOTE — Op Note (Signed)
OPERATIVE REPORT- TOTAL HIP ARTHROPLASTY   PREOPERATIVE DIAGNOSIS: Osteoarthritis of the Left hip.   POSTOPERATIVE DIAGNOSIS: Osteoarthritis of the Left  hip.   PROCEDURE: Left total hip arthroplasty, anterior approach.   SURGEON: Ollen Gross, MD   ASSISTANT: Weston Brass, PA-C  ANESTHESIA:  Spinal  ESTIMATED BLOOD LOSS:-100 mL    DRAINS: None  COMPLICATIONS: None   CONDITION: PACU - hemodynamically stable.   BRIEF CLINICAL NOTE: John Potts is a 77 y.o. male who has advanced end-  stage arthritis of their Left  hip with progressively worsening pain and  dysfunction.The patient has failed nonoperative management and presents for  total hip arthroplasty.   PROCEDURE IN DETAIL: After successful administration of spinal  anesthetic, the traction boots for the [facility] bed were placed on both  feet and the patient was placed onto the [facility] bed, boots placed into the leg  holders. The Left hip was then isolated from the perineum with plastic  drapes and prepped and draped in the usual sterile fashion. ASIS and  greater trochanter were marked and a oblique incision was made, starting  at about 1 cm lateral and 2 cm distal to the ASIS and coursing towards  the anterior cortex of the femur. The skin was cut with a 10 blade  through subcutaneous tissue to the level of the fascia overlying the  tensor fascia lata muscle. The fascia was then incised in line with the  incision at the junction of the anterior third and posterior 2/3rd. The  muscle was teased off the fascia and then the interval between the TFL  and the rectus was developed. The Hohmann retractor was then placed at  the top of the femoral neck over the capsule. The vessels overlying the  capsule were cauterized and the fat on top of the capsule was removed.  A Hohmann retractor was then placed anterior underneath the rectus  femoris to give exposure to the entire anterior capsule. A T-shaped  capsulotomy  was performed. The edges were tagged and the femoral head  was identified.       Osteophytes are removed off the superior acetabulum.  The femoral neck was then cut in situ with an oscillating saw. Traction  was then applied to the left lower extremity utilizing the [facility]  traction. The femoral head was then removed. Retractors were placed  around the acetabulum and then circumferential removal of the labrum was  performed. Osteophytes were also removed. Reaming starts at 51 mm to  medialize and  Increased in 2 mm increments to 53 mm. We reamed in  approximately 40 degrees of abduction, 20 degrees anteversion. A 54 mm  pinnacle acetabular shell was then impacted in anatomic position under  fluoroscopic guidance with excellent purchase. We did not need to place  any additional dome screws. A 36 mm neutral + 4 marathon liner was then  placed into the acetabular shell.       The femoral lift was then placed along the lateral aspect of the femur  just distal to the vastus ridge. The leg was  externally rotated and capsule  was stripped off the inferior aspect of the femoral neck down to the  level of the lesser trochanter, this was done with electrocautery. The femur was lifted after this was performed. The  leg was then placed in an extended and adducted position essentially delivering the femur. We also removed the capsule superiorly and the piriformis from the piriformis fossa to  gain excellent exposure of the  proximal femur. Rongeur was used to remove some cancellous bone to get  into the lateral portion of the proximal femur for placement of the  initial starter reamer. The starter broaches was placed  the starter broach  and was shown to go down the center of the canal. Broaching  with the Actis system was then performed starting at size 0  coursing  Up to size 6. A size 6 had excellent torsional and rotational  and axial stability. The trial high offset neck was then placed  with a 36 +  8.5 trial head. The hip was then reduced. We confirmed that  the stem was in the canal both on AP and lateral x-rays. It also has excellent sizing. The hip was reduced with outstanding stability through full extension and full external rotation.. AP pelvis was taken and the leg lengths were measured and found to be equal. Hip was then dislocated again and the femoral head and neck removed. The  femoral broach was removed. Size 6 Actis stem with a high offset  neck was then impacted into the femur following native anteversion. Has  excellent purchase in the canal. Excellent torsional and rotational and  axial stability. It is confirmed to be in the canal on AP and lateral  fluoroscopic views. The 36 + 8.5 metal head was placed and the hip  reduced with outstanding stability. Again AP pelvis was taken and it  confirmed that the leg lengths were equal. The wound was then copiously  irrigated with saline solution and the capsule reattached and repaired  with Ethibond suture. 30 ml of .25% Bupivicaine was  injected into the capsule and into the edge of the tensor fascia lata as well as subcutaneous tissue. The fascia overlying the tensor fascia lata was then closed with a running #1 V-Loc. Subcu was closed with interrupted 2-0 Vicryl and subcuticular running 4-0 Monocryl. Incision was cleaned  and dried. Steri-Strips and a bulky sterile dressing applied. The patient was awakened and transported to  recovery in stable condition.        Please note that a surgical assistant was a medical necessity for this procedure to perform it in a safe and expeditious manner. Assistant was necessary to provide appropriate retraction of vital neurovascular structures and to prevent femoral fracture and allow for anatomic placement of the prosthesis.  Ollen Gross, M.D.

## 2023-12-12 NOTE — Evaluation (Signed)
Physical Therapy Evaluation Patient Details Name: John Potts MRN: [id number] DOB: [birth year] Today's Date: 12/12/2023  History of Present Illness  77 yo male presents to therapy s/p L THA, anterior approach on 12/12/2023 due to failure of conservative measures. Pt PMH includes but is not limited to: HTN, HLD, lung nodules, OSA on CPAP, obstructive chronic bronchitis, chronic fatigue, angina, gout, and R THA, AA (2019).  Clinical Impression   John Potts is a 77 y.o. male POD 0 s/p L THA. Patient reports mod I with mobility at baseline. Patient is now limited by functional impairments (see PT problem list below) and requires CGA for bed mobility and CGA and cues for transfers. Patient was able to ambulate 55 feet with RW and CGA level of assist. Patient instructed in exercise to facilitate ROM and circulation to manage edema. Patient will benefit from continued skilled PT interventions to address impairments and progress towards PLOF. Acute PT will follow to progress mobility and stair training in preparation for safe discharge home with family support and HEP.       If plan is discharge home, recommend the following: A little help with walking and/or transfers;A little help with bathing/dressing/bathroom;Assistance with cooking/housework;Help with stairs or ramp for entrance;Assist for transportation   Can travel by private vehicle        Equipment Recommendations None recommended by PT  Recommendations for Other Services       Functional Status Assessment Patient has had a recent decline in their functional status and demonstrates the ability to make significant improvements in function in a reasonable and predictable amount of time.     Precautions / Restrictions Precautions Precautions: Fall Restrictions Weight Bearing Restrictions Per Provider Order: No LLE Weight Bearing Per Provider Order: Weight bearing as tolerated      Mobility  Bed Mobility Overal bed mobility:  Needs Assistance Bed Mobility: Supine to Sit     Supine to sit: Contact guard, HOB elevated, Used rails     General bed mobility comments: min cues    Transfers Overall transfer level: Needs assistance Equipment used: Rolling walker (2 wheels) Transfers: Sit to/from Stand Sit to Stand: Contact guard assist           General transfer comment: min cues for proper UE and AD placement    Ambulation/Gait Ambulation/Gait assistance: Contact guard assist Gait Distance (Feet): 55 Feet Assistive device: Rolling walker (2 wheels) Gait Pattern/deviations: Step-to pattern, Antalgic, Trunk flexed Gait velocity: decreased     General Gait Details: B UE support at RW to offload L LE in stance phase. min cues for sequencing  Stairs            Wheelchair Mobility     Tilt Bed    Modified Rankin (Stroke Patients Only)       Balance Overall balance assessment: Needs assistance Sitting-balance support: Feet supported Sitting balance-Leahy Scale: Good     Standing balance support: Bilateral upper extremity supported, Reliant on assistive device for balance, During functional activity Standing balance-Leahy Scale: Poor                               Pertinent Vitals/Pain Pain Assessment Pain Assessment: 0-10 Pain Score: 5  Pain Location: L Hip Pain Descriptors / Indicators: Aching, Burning, Operative site guarding, Dull Pain Intervention(s): Limited activity within patient's tolerance, Monitored during session, Premedicated before session, Repositioned, Ice applied    Home Living Family/patient expects to be  discharged to:: Private residence Living Arrangements: Spouse/significant other Available Help at Discharge: Family Type of Home: House Home Access: Stairs to enter Entrance Stairs-Rails: Right Entrance Stairs-Number of Steps: 4   Home Layout: Two level;Able to live on main level with bedroom/bathroom Home Equipment: Rolling Walker (2  wheels);Cane - single point      Prior Function Prior Level of Function : Independent/Modified Independent             Mobility Comments: mod I with use of SPC for functional mobility tasks, mod I for all ADLs, self care tasks and IADLs       Extremity/Trunk Assessment        Lower Extremity Assessment Lower Extremity Assessment: LLE deficits/detail LLE Deficits / Details: ankle DF/PF 5/5 LLE Sensation: WNL    Cervical / Trunk Assessment Cervical / Trunk Assessment: Normal  Communication   Communication Communication: No apparent difficulties  Cognition Arousal: Alert Behavior During Therapy: WFL for tasks assessed/performed Overall Cognitive Status: Within Functional Limits for tasks assessed                                          General Comments      Exercises Total Joint Exercises Ankle Circles/Pumps: AROM, Both, 10 reps   Assessment/Plan    PT Assessment Patient needs continued PT services  PT Problem List Decreased strength;Decreased range of motion;Decreased activity tolerance;Decreased balance;Decreased mobility;Decreased coordination;Pain       PT Treatment Interventions DME instruction;Gait training;Stair training;Functional mobility training;Therapeutic activities;Therapeutic exercise;Balance training;Neuromuscular re-education;Patient/family education;Modalities    PT Goals (Current goals can be found in the Care Plan section)  Acute Rehab PT Goals Patient Stated Goal: to be able to get out in the yard and garden PT Goal Formulation: With patient Time For Goal Achievement: 12/26/23 Potential to Achieve Goals: Good    Frequency 7X/week     Co-evaluation               AM-PAC PT "6 Clicks" Mobility  Outcome Measure Help needed turning from your back to your side while in a flat bed without using bedrails?: A Little Help needed moving from lying on your back to sitting on the side of a flat bed without using  bedrails?: A Little Help needed moving to and from a bed to a chair (including a wheelchair)?: A Little Help needed standing up from a chair using your arms (e.g., wheelchair or bedside chair)?: A Little Help needed to walk in hospital room?: A Little Help needed climbing 3-5 steps with a railing? : A Lot 6 Click Score: 17    End of Session Equipment Utilized During Treatment: Gait belt Activity Tolerance: No increased pain;Patient tolerated treatment well Patient left: in chair;with call bell/phone within reach;with chair alarm set Nurse Communication: Mobility status PT Visit Diagnosis: Unsteadiness on feet (R26.81);Other abnormalities of gait and mobility (R26.89);Muscle weakness (generalized) (M62.81);Difficulty in walking, not elsewhere classified (R26.2);Pain Pain - Right/Left: Left Pain - part of body: Hip;Leg    Time: 8657-8469 PT Time Calculation (min) (ACUTE ONLY): 18 min   Charges:   PT Evaluation $PT Eval Low Complexity: 1 Low   PT General Charges $$ ACUTE PT VISIT: 1 Visit         Johnny Bridge, PT Acute Rehab   Jacqualyn Posey 12/12/2023, 6:07 PM

## 2023-12-13 ENCOUNTER — Encounter (HOSPITAL_COMMUNITY): Payer: Self-pay | Admitting: Orthopedic Surgery

## 2023-12-13 DIAGNOSIS — Z79899 Other long term (current) drug therapy: Secondary | ICD-10-CM | POA: Diagnosis not present

## 2023-12-13 DIAGNOSIS — M1612 Unilateral primary osteoarthritis, left hip: Secondary | ICD-10-CM | POA: Diagnosis not present

## 2023-12-13 DIAGNOSIS — I10 Essential (primary) hypertension: Secondary | ICD-10-CM | POA: Diagnosis not present

## 2023-12-13 DIAGNOSIS — Z87891 Personal history of nicotine dependence: Secondary | ICD-10-CM | POA: Diagnosis not present

## 2023-12-13 DIAGNOSIS — Z96641 Presence of right artificial hip joint: Secondary | ICD-10-CM | POA: Diagnosis not present

## 2023-12-13 LAB — BASIC METABOLIC PANEL
Anion gap: 11 (ref 5–15)
BUN: 9 mg/dL (ref 8–23)
CO2: 19 mmol/L — ABNORMAL LOW (ref 22–32)
Calcium: 8.9 mg/dL (ref 8.9–10.3)
Chloride: 107 mmol/L (ref 98–111)
Creatinine, Ser: 0.74 mg/dL (ref 0.61–1.24)
GFR, Estimated: >60 mL/min (ref >60)
Glucose, Bld: 152 mg/dL — ABNORMAL HIGH (ref 70–99)
Potassium: 4.1 mmol/L (ref 3.5–5.1)
Sodium: 137 mmol/L (ref 135–145)

## 2023-12-13 LAB — CBC
HCT: 41.8 % (ref 39.0–52.0)
Hemoglobin: 13.4 g/dL (ref 13.0–17.0)
MCH: 30 pg (ref 26.0–34.0)
MCHC: 32.1 g/dL (ref 30.0–36.0)
MCV: 93.7 fL (ref 80.0–100.0)
Platelets: 204 10*3/uL (ref 150–400)
RBC: 4.46 MIL/uL (ref 4.22–5.81)
RDW: 12 % (ref 11.5–15.5)
WBC: 15 10*3/uL — ABNORMAL HIGH (ref 4.0–10.5)
nRBC: 0 % (ref 0.0–0.2)

## 2023-12-13 MED ORDER — ONDANSETRON HCL 4 MG PO TABS: 4.0000 mg | ORAL_TABLET | Freq: Four times a day (QID) | ORAL | 0 refills | Status: DC | PRN

## 2023-12-13 MED ORDER — TRAMADOL HCL 50 MG PO TABS: 50.0000 mg | ORAL_TABLET | Freq: Four times a day (QID) | ORAL | 0 refills | Status: DC | PRN

## 2023-12-13 MED ORDER — METHOCARBAMOL 500 MG PO TABS: 500.0000 mg | ORAL_TABLET | Freq: Four times a day (QID) | ORAL | 0 refills | Status: DC | PRN

## 2023-12-13 MED ORDER — ASPIRIN 81 MG PO CHEW: 81.0000 mg | CHEWABLE_TABLET | Freq: Two times a day (BID) | ORAL | 0 refills | Status: AC

## 2023-12-13 MED ORDER — HYDROCODONE-ACETAMINOPHEN 5-325 MG PO TABS: 1.0000 | ORAL_TABLET | Freq: Four times a day (QID) | ORAL | 0 refills | Status: DC | PRN

## 2023-12-13 NOTE — TOC Transition Note (Signed)
Transition of Care [facility]) - Discharge Note   Patient Details  Name: DEVONTEZ ASCHER MRN: [id number] Date of Birth: 1946-01-22  Transition of Care [facility] CM/SW Contact:  Amada Jupiter, LCSW Phone Number: 12/13/2023, 11:19 AM   Clinical Narrative:     Met with pt who confirms he has needed DME in the home.  Plan for HEP.  No further TOC needs.  Final next level of care: Home/Self Care Barriers to Discharge: No Barriers Identified   Patient Goals and CMS Choice Patient states their goals for this hospitalization and ongoing recovery are:: return home          Discharge Placement                       Discharge Plan and Services Additional resources added to the After Visit Summary for                  DME Arranged: N/A                    Social Drivers of Health (SDOH) Interventions SDOH Screenings   Food Insecurity: No Food Insecurity (12/12/2023)  Housing: Low Risk  (12/12/2023)  Transportation Needs: No Transportation Needs (12/12/2023)  Utilities: Not At Risk (12/12/2023)  Tobacco Use: Medium Risk (12/12/2023)     Readmission Risk Interventions     No data to display

## 2023-12-13 NOTE — Care Management Obs Status (Signed)
MEDICARE OBSERVATION STATUS NOTIFICATION   Patient Details  Name: John Potts MRN: [id number] Date of Birth: 1946/03/16   Medicare Observation Status Notification Given:  Yes    Amada Jupiter, LCSW 12/13/2023, 11:18 AM

## 2023-12-13 NOTE — Progress Notes (Signed)
   Subjective: 1 Day Post-Op Procedure(s) (LRB): TOTAL HIP ARTHROPLASTY ANTERIOR APPROACH (Left) Patient reports pain as mild.   Patient seen in rounds by Dr. Lequita Halt. Patient is well, and has had no acute complaints or problems. Denies chest pain or SOB. No issues overnight. Foley catheter removed this AM. We will continue therapy today, ambulated 21' yesterday.   Objective: Vital signs in last 24 hours: Temp:  [97 F (36.1 C)-98.8 F (37.1 C)] 97.6 F (36.4 C) (12/17 0600) Pulse Rate:  [76-101] 93 (12/17 0600) Resp:  [10-18] 17 (12/17 0600) BP: (92-151)/(59-85) 137/80 (12/17 0600) SpO2:  [95 %-100 %] 98 % (12/17 0600) Weight:  [72.6 kg] 72.6 kg (12/16 0931)  Intake/Output from previous day:  Intake/Output Summary (Last 24 hours) at 12/13/2023 0836 Last data filed at 12/13/2023 0600 Gross per 24 hour  Intake 3689.37 ml  Output 2100 ml  Net 1589.37 ml     Intake/Output this shift: No intake/output data recorded.  Labs: Recent Labs    12/13/23 0338  HGB 13.4   Recent Labs    12/13/23 0338  WBC 15.0*  RBC 4.46  HCT 41.8  PLT 204   Recent Labs    12/13/23 0338  NA 137  K 4.1  CL 107  CO2 19*  BUN 9  CREATININE 0.74  GLUCOSE 152*  CALCIUM 8.9   No results for input(s): "LABPT", "INR" in the last 72 hours.  Exam: General - Patient is Alert and Oriented Extremity - Neurologically intact Neurovascular intact Sensation intact distally Dorsiflexion/Plantar flexion intact Dressing - dressing C/D/I Motor Function - intact, moving foot and toes well on exam.   Past Medical History:  Diagnosis Date   Allergy    Arthritis    Cancer (HCC)    Cold intolerance 06/20/2019   Complication of anesthesia    Difficult urinating   Dysrhythmia    Elevated PSA 07/23/2019   Erectile dysfunction    Gout    Hyperlipidemia    Hypertension    Kidney stone    Knee pain    Lung nodules 06/20/2019   OSA on CPAP    Sleep apnea    uses cpap   Weight loss  06/20/2019   Wrist pain 06/20/2019    Assessment/Plan: 1 Day Post-Op Procedure(s) (LRB): TOTAL HIP ARTHROPLASTY ANTERIOR APPROACH (Left) Principal Problem:   OA (osteoarthritis) of hip Active Problems:   Primary osteoarthritis of left hip  Estimated body mass index is 25.06 kg/m as calculated from the following:   Height as of this encounter: 5\' 7"  (1.702 m).   Weight as of this encounter: 72.6 kg. Advance diet Up with therapy D/C IV fluids  DVT Prophylaxis - Aspirin Weight bearing as tolerated. Continue therapy.  Plan is to go Home after hospital stay. Plan for discharge after one session this morning with HEP Follow-up in the office in 2 weeks.  The PDMP database was reviewed today prior to any opioid medications being prescribed to this patient.  Arther Abbott, PA-C Orthopedic Surgery [phone] 12/13/2023, 8:36 AM

## 2023-12-13 NOTE — Progress Notes (Signed)
Physical Therapy Treatment Patient Details Name: John Potts MRN: [id number] DOB: [birth year] Today's Date: 12/13/2023   History of Present Illness 77 yo male presents to therapy s/p L THA, anterior approach on 12/12/2023 due to failure of conservative measures. Pt PMH includes but is not limited to: HTN, HLD, lung nodules, OSA on CPAP, obstructive chronic bronchitis, chronic fatigue, angina, gout, and R THA, AA (2019).    PT Comments  Pt is POD # 1 and is progressing well.  He has excellent pain control and was able to ambulate 150' with RW safely.  Performed stairs with single rail similar to home set up.  Pt does not have f/u therapy at d/c - was educated on HEP and demonstrated good understanding of exercises and progression. Pt demonstrates safe gait & transfers in order to return home from PT perspective once discharged by MD.  While in hospital, will continue to benefit from PT for skilled therapy to advance mobility and exercises.       If plan is discharge home, recommend the following: A little help with walking and/or transfers;A little help with bathing/dressing/bathroom;Assistance with cooking/housework;Help with stairs or ramp for entrance;Assist for transportation   Can travel by private vehicle        Equipment Recommendations  None recommended by PT    Recommendations for Other Services       Precautions / Restrictions Precautions Precautions: Fall Restrictions Weight Bearing Restrictions Per Provider Order: No LLE Weight Bearing Per Provider Order: Weight bearing as tolerated     Mobility  Bed Mobility Overal bed mobility: Needs Assistance Bed Mobility: Supine to Sit     Supine to sit: Supervision          Transfers Overall transfer level: Needs assistance Equipment used: Rolling walker (2 wheels) Transfers: Sit to/from Stand Sit to Stand: Supervision           General transfer comment: performed x 3; cues for UE placement with good carryover     Ambulation/Gait Ambulation/Gait assistance: Contact guard assist Gait Distance (Feet): 150 Feet Assistive device: Rolling walker (2 wheels) Gait Pattern/deviations: Step-through pattern Gait velocity: normal     General Gait Details: smooth, steady gait   Stairs Stairs: Yes Stairs assistance: Contact guard assist Stair Management: One rail Right, Step to pattern, Forwards Number of Stairs: 5 General stair comments: tolerated well; educated on sequencing   Wheelchair Mobility     Tilt Bed    Modified Rankin (Stroke Patients Only)       Balance Overall balance assessment: Needs assistance Sitting-balance support: Feet supported Sitting balance-Leahy Scale: Good     Standing balance support: Bilateral upper extremity supported, No upper extremity supported Standing balance-Leahy Scale: Fair Standing balance comment: Could static stand without AD                            Cognition Arousal: Alert Behavior During Therapy: WFL for tasks assessed/performed Overall Cognitive Status: Within Functional Limits for tasks assessed                                          Exercises Total Joint Exercises Ankle Circles/Pumps: AROM, Both, 10 reps, Supine Quad Sets: AROM, Both, 10 reps, Supine Heel Slides: AROM, Left, 10 reps, Supine Hip ABduction/ADduction: AROM, Left, 10 reps, Supine Long Arc Quad: AROM, Left, 10 reps, Seated Other  Exercises Other Exercises: Standing AROM L 10 reps wtih supervision and RW: marching, hip abd, hip ext, h-s curl Other Exercises: educated on AAROM techniques as needed    General Comments General comments (skin integrity, edema, etc.): VSS Educated on safe ice use, no pivots, car transfers,  and TED hose during day. Also, encouraged walking every 1-2 hours during day. Educated on HEP with focus on mobility the first weeks. Discussed doing exercises within pain control and if pain increasing could decreased ROM,  reps, and stop exercises as needed. Encouraged to perform ankle pumps frequently for blood flow.      Pertinent Vitals/Pain Pain Assessment Pain Assessment: 0-10 Pain Score: 4  Pain Location: L Hip Pain Descriptors / Indicators: Discomfort Pain Intervention(s): Limited activity within patient's tolerance, Monitored during session, Premedicated before session, Repositioned, Ice applied    Home Living                          Prior Function            PT Goals (current goals can now be found in the care plan section) Progress towards PT goals: Progressing toward goals    Frequency    7X/week      PT Plan      Co-evaluation              AM-PAC PT "6 Clicks" Mobility   Outcome Measure  Help needed turning from your back to your side while in a flat bed without using bedrails?: A Little Help needed moving from lying on your back to sitting on the side of a flat bed without using bedrails?: A Little Help needed moving to and from a bed to a chair (including a wheelchair)?: A Little Help needed standing up from a chair using your arms (e.g., wheelchair or bedside chair)?: A Little Help needed to walk in hospital room?: A Little Help needed climbing 3-5 steps with a railing? : A Little 6 Click Score: 18    End of Session Equipment Utilized During Treatment: Gait belt Activity Tolerance: No increased pain;Patient tolerated treatment well Patient left: in chair;with call bell/phone within reach;with chair alarm set Nurse Communication: Mobility status PT Visit Diagnosis: Unsteadiness on feet (R26.81);Other abnormalities of gait and mobility (R26.89);Muscle weakness (generalized) (M62.81);Difficulty in walking, not elsewhere classified (R26.2);Pain Pain - Right/Left: Left Pain - part of body: Hip;Leg     Time: 4034-7425 PT Time Calculation (min) (ACUTE ONLY): 24 min  Charges:    $Gait Training: 8-22 mins $Therapeutic Exercise: 8-22 mins PT General  Charges $$ ACUTE PT VISIT: 1 Visit                     Anise Salvo, PT Acute Rehab Services [facility] [phone]    Rayetta Humphrey 12/13/2023, 12:07 PM

## 2023-12-26 NOTE — Discharge Summary (Cosign Needed)
Patient ID: John Potts MRN: [id number] DOB/AGE: [birth year] 77 y.o.  Admit date: 12/12/2023 Discharge date: 12/13/2023  Admission Diagnoses:  Principal Problem:   OA (osteoarthritis) of hip Active Problems:   Primary osteoarthritis of left hip   Discharge Diagnoses:  Same  Past Medical History:  Diagnosis Date   Allergy    Arthritis    Cancer (HCC)    Cold intolerance 06/20/2019   Complication of anesthesia    Difficult urinating   Dysrhythmia    Elevated PSA 07/23/2019   Erectile dysfunction    Gout    Hyperlipidemia    Hypertension    Kidney stone    Knee pain    Lung nodules 06/20/2019   OSA on CPAP    Sleep apnea    uses cpap   Weight loss 06/20/2019   Wrist pain 06/20/2019    Surgeries: Procedure(s): TOTAL HIP ARTHROPLASTY ANTERIOR APPROACH on 12/12/2023   Consultants:   Discharged Condition: Improved  Hospital Course: AMIER DIEDRICH is an 77 y.o. male who was admitted 12/12/2023 for operative treatment ofOA (osteoarthritis) of hip. Patient has severe unremitting pain that affects sleep, daily activities, and work/hobbies. After pre-op clearance the patient was taken to the operating room on 12/12/2023 and underwent  Procedure(s): TOTAL HIP ARTHROPLASTY ANTERIOR APPROACH.    Patient was given perioperative antibiotics:  Anti-infectives (From admission, onward)    Start     Dose/Rate Route Frequency Ordered Stop   12/12/23 1800  ceFAZolin (ANCEF) IVPB 2g/100 mL premix        2 g 200 mL/hr over 30 Minutes Intravenous Every 6 hours 12/12/23 1506 12/13/23 0129   12/12/23 0915  ceFAZolin (ANCEF) IVPB 2g/100 mL premix        2 g 200 mL/hr over 30 Minutes Intravenous On call to O.R. 12/12/23 0912 12/12/23 1137        Patient was given sequential compression devices, early ambulation, and chemoprophylaxis to prevent DVT.  Patient benefited maximally from hospital stay and there were no complications.    Recent vital signs: No data found.   Recent  laboratory studies: No results for input(s): "WBC", "HGB", "HCT", "PLT", "NA", "K", "CL", "CO2", "BUN", "CREATININE", "GLUCOSE", "INR", "CALCIUM" in the last 72 hours.  Invalid input(s): "PT", "2"   Discharge Medications:   Allergies as of 12/13/2023       Reactions   Wellbutrin [bupropion]    Fatigue, increased BP         Medication List     TAKE these medications    allopurinol 300 MG tablet Commonly known as: ZYLOPRIM Take 300 mg by mouth daily.   amLODipine 5 MG tablet Commonly known as: NORVASC Take 5 mg by mouth daily.   aspirin 81 MG chewable tablet Chew 1 tablet (81 mg total) by mouth 2 (two) times daily for 20 days. Then take one 81 mg aspirin once a day for three weeks. Then discontinue aspirin.   famotidine-calcium carbonate-magnesium hydroxide 10-800-165 MG chewable tablet Commonly known as: PEPCID COMPLETE Chew 1 tablet by mouth daily as needed (heartburn).   HYDROcodone-acetaminophen 5-325 MG tablet Commonly known as: NORCO/VICODIN Take 1-2 tablets by mouth every 6 (six) hours as needed for severe pain (pain score 7-10).   ipratropium 0.06 % nasal spray Commonly known as: ATROVENT Place 2 sprays into both nostrils daily.   Melatonin 5 MG Caps Take 5 mg by mouth at bedtime.   methocarbamol 500 MG tablet Commonly known as: ROBAXIN Take 1 tablet (500 mg total)  by mouth every 6 (six) hours as needed for muscle spasms.   metoprolol succinate 25 MG 24 hr tablet Commonly known as: TOPROL-XL Take 12.5 mg by mouth daily.   olmesartan 40 MG tablet Commonly known as: BENICAR Take 40 mg by mouth daily.   ondansetron 4 MG tablet Commonly known as: ZOFRAN Take 1 tablet (4 mg total) by mouth every 6 (six) hours as needed for nausea.   pravastatin 20 MG tablet Commonly known as: PRAVACHOL Take 20 mg by mouth daily.   tadalafil 20 MG tablet Commonly known as: CIALIS Take 20 mg by mouth daily as needed for erectile dysfunction.   tamsulosin 0.4 MG  Caps capsule Commonly known as: FLOMAX Take 0.4 mg by mouth daily.   traMADol 50 MG tablet Commonly known as: ULTRAM Take 1-2 tablets (50-100 mg total) by mouth every 6 (six) hours as needed.               Discharge Care Instructions  (From admission, onward)           Start     Ordered   12/13/23 0000  Weight bearing as tolerated        12/13/23 0838   12/13/23 0000  Change dressing       Comments: You have an adhesive waterproof bandage over the incision. Leave this in place until your first follow-up appointment. Once you remove this you will not need to place another bandage.   12/13/23 0838            Diagnostic Studies: DG Pelvis Portable Result Date: 12/12/2023 CLINICAL DATA:  Left hip replacement, primary osteoarthritis of left hip. EXAM: PORTABLE PELVIS 1-2 VIEWS COMPARISON:  None Available. FINDINGS: Left hip arthroplasty in expected alignment. No periprosthetic lucency or fracture. Recent postsurgical change includes air and edema in the soft tissues. Previous right hip arthroplasty. IMPRESSION: Left hip arthroplasty without immediate postoperative complication. Electronically Signed   By: Narda Rutherford M.D.   On: 12/12/2023 15:51   DG HIP UNILAT WITH PELVIS 1V LEFT Result Date: 12/12/2023 CLINICAL DATA:  Elective surgery. EXAM: DG HIP (WITH OR WITHOUT PELVIS) 1V*L* COMPARISON:  None Available. FINDINGS: Eight fluoroscopic spot views of the pelvis and left hip obtained in the operating room. Sequential images during hip arthroplasty. Fluoroscopy time 6 seconds. Dose 0.67 mGy. IMPRESSION: Intraoperative fluoroscopy during left hip arthroplasty. Electronically Signed   By: Narda Rutherford M.D.   On: 12/12/2023 15:43   DG C-Arm 1-60 Min-No Report Result Date: 12/12/2023 Fluoroscopy was utilized by the requesting physician.  No radiographic interpretation.   DG C-Arm 1-60 Min-No Report Result Date: 12/12/2023 Fluoroscopy was utilized by the requesting  physician.  No radiographic interpretation.    Disposition: Discharge disposition: 01-Home or Self Care       Discharge Instructions     Call MD / Call 911   Complete by: As directed    If you experience chest pain or shortness of breath, CALL 911 and be transported to the hospital emergency room.  If you develope a fever above 101 F, pus (white drainage) or increased drainage or redness at the wound, or calf pain, call your surgeon's office.   Change dressing   Complete by: As directed    You have an adhesive waterproof bandage over the incision. Leave this in place until your first follow-up appointment. Once you remove this you will not need to place another bandage.   Constipation Prevention   Complete by: As directed  Drink plenty of fluids.  Prune juice may be helpful.  You may use a stool softener, such as Colace (over the counter) 100 mg twice a day.  Use MiraLax (over the counter) for constipation as needed.   Diet - low sodium heart healthy   Complete by: As directed    Do not sit on low chairs, stoools or toilet seats, as it may be difficult to get up from low surfaces   Complete by: As directed    Driving restrictions   Complete by: As directed    No driving for two weeks   Post-operative opioid taper instructions:   Complete by: As directed    POST-OPERATIVE OPIOID TAPER INSTRUCTIONS: It is important to wean off of your opioid medication as soon as possible. If you do not need pain medication after your surgery it is ok to stop day one. Opioids include: Codeine, Hydrocodone(Norco, Vicodin), Oxycodone(Percocet, oxycontin) and hydromorphone amongst others.  Long term and even short term use of opiods can cause: Increased pain response Dependence Constipation Depression Respiratory depression And more.  Withdrawal symptoms can include Flu like symptoms Nausea, vomiting And more Techniques to manage these symptoms Hydrate well Eat regular healthy meals Stay  active Use relaxation techniques(deep breathing, meditating, yoga) Do Not substitute Alcohol to help with tapering If you have been on opioids for less than two weeks and do not have pain than it is ok to stop all together.  Plan to wean off of opioids This plan should start within one week post op of your joint replacement. Maintain the same interval or time between taking each dose and first decrease the dose.  Cut the total daily intake of opioids by one tablet each day Next start to increase the time between doses. The last dose that should be eliminated is the evening dose.      TED hose   Complete by: As directed    Use stockings (TED hose) for three weeks on both leg(s).  You may remove them at night for sleeping.   Weight bearing as tolerated   Complete by: As directed         Follow-up Information     Ollen Gross, MD. Go on 12/27/2023.   Specialty: Orthopedic Surgery Why: You are scheduled for first post op appt on Tuesday December 31 at 1:45pm. Contact information: [address] [place] [address] [phone]                  Signed: Arther Abbott 12/26/2023, 2:06 PM

## 2024-01-16 DIAGNOSIS — H52203 Unspecified astigmatism, bilateral: Secondary | ICD-10-CM | POA: Diagnosis not present

## 2024-01-16 DIAGNOSIS — H35372 Puckering of macula, left eye: Secondary | ICD-10-CM | POA: Diagnosis not present

## 2024-01-16 DIAGNOSIS — H18593 Other hereditary corneal dystrophies, bilateral: Secondary | ICD-10-CM | POA: Diagnosis not present

## 2024-01-16 DIAGNOSIS — Z961 Presence of intraocular lens: Secondary | ICD-10-CM | POA: Diagnosis not present

## 2024-01-18 DIAGNOSIS — Z471 Aftercare following joint replacement surgery: Secondary | ICD-10-CM | POA: Diagnosis not present

## 2024-01-22 DIAGNOSIS — G4733 Obstructive sleep apnea (adult) (pediatric): Secondary | ICD-10-CM | POA: Diagnosis not present

## 2024-01-25 ENCOUNTER — Telehealth: Payer: Self-pay | Admitting: Family Medicine

## 2024-01-25 NOTE — Telephone Encounter (Signed)
Appointment details confirmed

## 2024-01-25 NOTE — Progress Notes (Unsigned)
Marland Kitchen

## 2024-01-25 NOTE — Patient Instructions (Signed)

## 2024-01-25 NOTE — Progress Notes (Unsigned)
PATIENT: John Potts DOB: 29-Nov-1946  REASON FOR VISIT: follow up HISTORY FROM: patient  No chief complaint on file.    HISTORY OF PRESENT ILLNESS:  01/25/24 ALL:  John Potts returns for follow up for OSA on CPAP.     01/25/2023 ALL:  John Potts returns for follow up for OSA on CPAP. He continues to do well. He is using therapy nightly for about 10 hours on average. He denies concerns with machine or supplies. He is sleeping well. He was recently diagnosed with Covid and Flu. He is recovering well.      12/16/2021 ALL: John Potts is a 78 y.o. male here today for follow up for OSA on CPAP. Repeat sleep study 01/2021 confirmed OSA. He has received his new machine. He is doing well. He is using machine nightly for at least four hours. No concerns with new machine or supplies.      REVIEW OF SYSTEMS: Out of a complete 14 system review of symptoms, the patient complains only of the following symptoms, none and all other reviewed systems are negative.  ESS: 2/24   ALLERGIES: Allergies  Allergen Reactions   Wellbutrin [Bupropion]     Fatigue, increased BP     HOME MEDICATIONS: Outpatient Medications Prior to Visit  Medication Sig Dispense Refill   allopurinol (ZYLOPRIM) 300 MG tablet Take 300 mg by mouth daily.     amLODipine (NORVASC) 5 MG tablet Take 5 mg by mouth daily.     famotidine-calcium carbonate-magnesium hydroxide (PEPCID COMPLETE) 10-800-165 MG chewable tablet Chew 1 tablet by mouth daily as needed (heartburn).     HYDROcodone-acetaminophen (NORCO/VICODIN) 5-325 MG tablet Take 1-2 tablets by mouth every 6 (six) hours as needed for severe pain (pain score 7-10). 42 tablet 0   ipratropium (ATROVENT) 0.06 % nasal spray Place 2 sprays into both nostrils daily.     Melatonin 5 MG CAPS Take 5 mg by mouth at bedtime.     methocarbamol (ROBAXIN) 500 MG tablet Take 1 tablet (500 mg total) by mouth every 6 (six) hours as needed for muscle spasms. 40 tablet 0   metoprolol  succinate (TOPROL-XL) 25 MG 24 hr tablet Take 12.5 mg by mouth daily.     olmesartan (BENICAR) 40 MG tablet Take 40 mg by mouth daily.     ondansetron (ZOFRAN) 4 MG tablet Take 1 tablet (4 mg total) by mouth every 6 (six) hours as needed for nausea. 20 tablet 0   pravastatin (PRAVACHOL) 20 MG tablet Take 20 mg by mouth daily.     tadalafil (CIALIS) 20 MG tablet Take 20 mg by mouth daily as needed for erectile dysfunction.     Tamsulosin HCl (FLOMAX) 0.4 MG CAPS Take 0.4 mg by mouth daily.      traMADol (ULTRAM) 50 MG tablet Take 1-2 tablets (50-100 mg total) by mouth every 6 (six) hours as needed. 40 tablet 0   No facility-administered medications prior to visit.    PAST MEDICAL HISTORY: Past Medical History:  Diagnosis Date   Allergy    Arthritis    Cancer (HCC)    Cold intolerance 06/20/2019   Complication of anesthesia    Difficult urinating   Dysrhythmia    Elevated PSA 07/23/2019   Erectile dysfunction    Gout    Hyperlipidemia    Hypertension    Kidney stone    Knee pain    Lung nodules 06/20/2019   OSA on CPAP    Sleep apnea  uses cpap   Weight loss 06/20/2019   Wrist pain 06/20/2019    PAST SURGICAL HISTORY: Past Surgical History:  Procedure Laterality Date   CATARACT EXTRACTION, BILATERAL     LIPOMA EXCISION     MOHS SURGERY     TONSILLECTOMY     TOTAL HIP ARTHROPLASTY Right 02/22/2018   Procedure: RIGHT TOTAL HIP ARTHROPLASTY ANTERIOR APPROACH;  Surgeon: Ollen Gross, MD;  Location: [facility];  Service: Orthopedics;  Laterality: Right;   TOTAL HIP ARTHROPLASTY Left 12/12/2023   Procedure: TOTAL HIP ARTHROPLASTY ANTERIOR APPROACH;  Surgeon: Ollen Gross, MD;  Location: [facility];  Service: Orthopedics;  Laterality: Left;    FAMILY HISTORY: Family History  Problem Relation Age of Onset   Hypertension Mother    Uterine cancer Mother    Hypertension Father    Stomach cancer Neg Hx    Rectal cancer Neg Hx    Esophageal cancer Neg Hx    Colon polyps Neg Hx     Colon cancer Neg Hx     SOCIAL HISTORY: Social History   Socioeconomic History   Marital status: Married    Spouse name: Not on file   Number of children: Not on file   Years of education: Not on file   Highest education level: Not on file  Occupational History   Occupation: retired  Tobacco Use   Smoking status: Former    Current packs/day: 0.00    Average packs/day: 0.5 packs/day for 50.0 years (25.0 ttl pk-yrs)    Types: Cigars, Cigarettes    Start date: 04/14/1967    Quit date: 04/13/2017    Years since quitting: 6.7   Smokeless tobacco: Never   Tobacco comments:    Pt smokes intermittently  Vaping Use   Vaping status: Former  Substance and Sexual Activity   Alcohol use: Yes    Alcohol/week: 7.0 standard drinks of alcohol    Types: 7 Standard drinks or equivalent per week    Comment: [place]   Drug use: No   Sexual activity: Not on file  Other Topics Concern   Not on file  Social History Narrative   Drinks 2 cups of coffee in the morning.   Social Drivers of Corporate investment banker Strain: Not on file  Food Insecurity: No Food Insecurity (12/12/2023)   Hunger Vital Sign    Worried About Running Out of Food in the Last Year: Never true    Ran Out of Food in the Last Year: Never true  Transportation Needs: No Transportation Needs (12/12/2023)   PRAPARE - Administrator, Civil Service (Medical): No    Lack of Transportation (Non-Medical): No  Physical Activity: Not on file  Stress: Not on file  Social Connections: Not on file  Intimate Partner Violence: Not At Risk (12/12/2023)   Humiliation, Afraid, Rape, and Kick questionnaire    Fear of Current or Ex-Partner: No    Emotionally Abused: No    Physically Abused: No    Sexually Abused: No     PHYSICAL EXAM  There were no vitals filed for this visit.   There is no height or weight on file to calculate BMI.  Generalized: Well developed, in no acute distress  Cardiology: normal rate  and rhythm, no murmur noted Respiratory: clear to auscultation bilaterally  Neurological examination  Mentation: Alert oriented to time, place, history taking. Follows all commands speech and language fluent Cranial nerve II-XII: Pupils were equal round reactive to light. Extraocular movements were  full, visual field were full  Motor: The motor testing reveals 5 over 5 strength of all 4 extremities. Good symmetric motor tone is noted throughout.  Gait and station: Gait is normal.    DIAGNOSTIC DATA (LABS, IMAGING, TESTING) - I reviewed patient records, labs, notes, testing and imaging myself where available.      No data to display           Lab Results  Component Value Date   WBC 15.0 (H) 12/13/2023   HGB 13.4 12/13/2023   HCT 41.8 12/13/2023   MCV 93.7 12/13/2023   PLT 204 12/13/2023      Component Value Date/Time   NA 137 12/13/2023 0338   K 4.1 12/13/2023 0338   CL 107 12/13/2023 0338   CO2 19 (L) 12/13/2023 0338   GLUCOSE 152 (H) 12/13/2023 0338   BUN 9 12/13/2023 0338   CREATININE 0.74 12/13/2023 0338   CREATININE 0.85 06/20/2019 1446   CALCIUM 8.9 12/13/2023 0338   PROT 6.8 06/20/2019 1446   ALBUMIN 4.0 02/17/2018 1050   AST 19 06/20/2019 1446   ALT 22 06/20/2019 1446   ALKPHOS 74 02/17/2018 1050   BILITOT 0.5 06/20/2019 1446   GFRNONAA >60 12/13/2023 0338   GFRNONAA 88 06/20/2019 1446   GFRAA >60 04/17/2020 2202   GFRAA 102 06/20/2019 1446   No results found for: "CHOL", "HDL", "LDLCALC", "LDLDIRECT", "TRIG", "CHOLHDL" No results found for: "HGBA1C" No results found for: "VITAMINB12" No results found for: "TSH"   ASSESSMENT AND PLAN 78 y.o. year old male  has a past medical history of Allergy, Arthritis, Cancer (HCC), Cold intolerance (06/20/2019), Complication of anesthesia, Dysrhythmia, Elevated PSA (07/23/2019), Erectile dysfunction, Gout, Hyperlipidemia, Hypertension, Kidney stone, Knee pain, Lung nodules (06/20/2019), OSA on CPAP, Sleep apnea,  Weight loss (06/20/2019), and Wrist pain (06/20/2019). here with   No diagnosis found.    John Potts is doing well on CPAP therapy. Compliance report reveals excellent. compliance was encouraged to continue using CPAP nightly and for greater than 4 hours each night. We will update supply orders as indicated. Risks of untreated sleep apnea review and education materials provided. Healthy lifestyle habits encouraged. He will follow up in 1 year, sooner if needed. He verbalizes understanding and agreement with this plan.    No orders of the defined types were placed in this encounter.    No orders of the defined types were placed in this encounter.     Shawnie Dapper, FNP-C 01/25/2024, 3:45 PM [facility] [address], [address] [place], [place] [address] [phone]

## 2024-01-26 ENCOUNTER — Ambulatory Visit: Payer: Medicare PPO | Admitting: Family Medicine

## 2024-01-26 ENCOUNTER — Encounter: Payer: Self-pay | Admitting: Family Medicine

## 2024-01-26 VITALS — BP 138/79 | HR 86 | Ht 68.0 in | Wt 158.0 lb

## 2024-01-26 DIAGNOSIS — G4733 Obstructive sleep apnea (adult) (pediatric): Secondary | ICD-10-CM

## 2024-02-21 DIAGNOSIS — L821 Other seborrheic keratosis: Secondary | ICD-10-CM | POA: Diagnosis not present

## 2024-02-21 DIAGNOSIS — Z85828 Personal history of other malignant neoplasm of skin: Secondary | ICD-10-CM | POA: Diagnosis not present

## 2024-02-21 DIAGNOSIS — L814 Other melanin hyperpigmentation: Secondary | ICD-10-CM | POA: Diagnosis not present

## 2024-02-21 DIAGNOSIS — L648 Other androgenic alopecia: Secondary | ICD-10-CM | POA: Diagnosis not present

## 2024-02-21 DIAGNOSIS — Z08 Encounter for follow-up examination after completed treatment for malignant neoplasm: Secondary | ICD-10-CM | POA: Diagnosis not present

## 2024-02-21 DIAGNOSIS — D225 Melanocytic nevi of trunk: Secondary | ICD-10-CM | POA: Diagnosis not present

## 2024-02-23 ENCOUNTER — Ambulatory Visit (AMBULATORY_SURGERY_CENTER): Payer: Medicare PPO | Admitting: *Deleted

## 2024-02-23 VITALS — Ht 68.0 in | Wt 160.0 lb

## 2024-02-23 DIAGNOSIS — Z860100 Personal history of colon polyps, unspecified: Secondary | ICD-10-CM

## 2024-02-23 MED ORDER — SUFLAVE 178.7 G PO SOLR: 1.0000 | Freq: Once | ORAL | 0 refills | Status: AC

## 2024-02-23 NOTE — Progress Notes (Signed)
 Pt's name and DOB verified at the beginning of the pre-visit wit 2 identifiers  Pt denies any difficulty with ambulating,sitting, laying down or rolling side to side  Pt has no issues with ambulation   Pt has no issues moving head neck or swallowing  No egg or soy allergy known to patient   Pt stated he has had an episode after removal of Lipoma had to go to ER to get cathed  Pt denies having issues being intubated  No FH of Malignant Hyperthermia  Pt is not on diet pills or shots  Pt is not on home 02   Pt is not on blood thinners   Pt denies issues with constipation   Pt is not on dialysis  Pt hx of PAC's after COVID shot  Pt denies any upcoming cardiac testing  Patient's chart reviewed by Cathlyn Parsons CNRA prior to pre-visit and patient appropriate for the [facility].  Pre-visit completed and red dot placed by patient's name on their procedure day (on provider's schedule).    Visit by phone  Pt states weight is 160 lb  IInstructions reviewed. Pt given [facility], [facility] main # and MD on call # prior to instructions.  Pt states understanding of instructions. Instructed to review again prior to procedure. Pt states they will.   Informed pt that they will receive a text or  call from [facility] regarding there prep med.

## 2024-03-01 ENCOUNTER — Encounter: Payer: Self-pay | Admitting: Pediatrics

## 2024-03-05 NOTE — Progress Notes (Unsigned)
 [facility] History and Physical   Primary Care Physician:  Garlan Fillers, MD   Reason for Procedure:  Surveillance of adenomatous colon polyps  Plan:    Surveillance colonoscopy   HPI: John Potts is a 79 y.o. male undergoing surveillance colonoscopy for previous history of colon tubular adenomas:  Colonoscopy 2019 - 5 polyps -on path some more small tubular adenomas and other benign mucosa  Colonoscopy 2014-1 polyp removed that was a prolapse type polyp  No family history of colorectal cancer or polyps.  Patient denies current symptoms of rectal bleeding or change in bowel habits.   Past Medical History:  Diagnosis Date   Allergy    Arthritis    Cancer (HCC)    Cataract    Cold intolerance 06/20/2019   Complication of anesthesia    Difficult urinating   Dysrhythmia    Elevated PSA 07/23/2019   Erectile dysfunction    Gout    Hyperlipidemia    Hypertension    Kidney stone    Knee pain    Lung nodules 06/20/2019   OSA on CPAP    PAC (premature atrial contraction)    after COVID shot   Sleep apnea    uses cpap   Weight loss 06/20/2019   Wrist pain 06/20/2019    Past Surgical History:  Procedure Laterality Date   CATARACT EXTRACTION, BILATERAL     LIPOMA EXCISION     MOHS SURGERY     TONSILLECTOMY     TOTAL HIP ARTHROPLASTY Right 02/22/2018   Procedure: RIGHT TOTAL HIP ARTHROPLASTY ANTERIOR APPROACH;  Surgeon: Ollen Gross, MD;  Location: [facility];  Service: Orthopedics;  Laterality: Right;   TOTAL HIP ARTHROPLASTY Left 12/12/2023   Procedure: TOTAL HIP ARTHROPLASTY ANTERIOR APPROACH;  Surgeon: Ollen Gross, MD;  Location: [facility];  Service: Orthopedics;  Laterality: Left;    Prior to Admission medications   Medication Sig Start Date End Date Taking? Authorizing Provider  allopurinol (ZYLOPRIM) 300 MG tablet Take 300 mg by mouth daily.    [provider]  amLODipine (NORVASC) 5 MG tablet Take 5 mg by mouth daily.    [provider]  famotidine-calcium carbonate-magnesium hydroxide (PEPCID COMPLETE) 10-800-165 MG chewable tablet Chew 1 tablet by mouth daily as needed (heartburn).    [provider]  ipratropium (ATROVENT) 0.06 % nasal spray Place 2 sprays into both nostrils daily.    [provider]  Melatonin 5 MG CAPS Take 5 mg by mouth at bedtime.    [provider]  metoprolol succinate (TOPROL-XL) 25 MG 24 hr tablet Take 12.5 mg by mouth daily.    [provider]  Multiple Vitamin (MULTIVITAMIN) capsule Take 1 capsule by mouth daily.    [provider]  olmesartan (BENICAR) 40 MG tablet Take 40 mg by mouth daily. 04/25/20   [provider]  pravastatin (PRAVACHOL) 20 MG tablet Take 20 mg by mouth daily.    [provider]  tadalafil (CIALIS) 20 MG tablet Take 20 mg by mouth daily as needed for erectile dysfunction.    [provider]  Tamsulosin HCl (FLOMAX) 0.4 MG CAPS Take 0.4 mg by mouth daily.     [provider]    Current Outpatient Medications  Medication Sig Dispense Refill   allopurinol (ZYLOPRIM) 300 MG tablet Take 300 mg by mouth daily.     amLODipine (NORVASC) 5 MG tablet Take 5 mg by mouth daily.     ipratropium (ATROVENT) 0.06 % nasal  spray Place 2 sprays into both nostrils daily.     Melatonin 5 MG CAPS Take 5 mg by mouth at bedtime.     metoprolol succinate (TOPROL-XL) 25 MG 24 hr tablet Take 12.5 mg by mouth daily.     Multiple Vitamin (MULTIVITAMIN) capsule Take 1 capsule by mouth daily.     olmesartan (BENICAR) 40 MG tablet Take 40 mg by mouth daily.     pravastatin (PRAVACHOL) 20 MG tablet Take 20 mg by mouth daily.     Tamsulosin HCl (FLOMAX) 0.4 MG CAPS Take 0.4 mg by mouth daily.      famotidine-calcium carbonate-magnesium hydroxide (PEPCID COMPLETE) 10-800-165 MG chewable tablet Chew 1 tablet by mouth daily as needed (heartburn).     tadalafil (CIALIS) 20 MG tablet Take 20 mg by mouth daily as  needed for erectile dysfunction.     Current Facility-Administered Medications  Medication Dose Route Frequency Provider Last Rate Last Admin   0.9 %  sodium chloride infusion  500 mL Intravenous Once Viktoria Gruetzmacher, Durene Romans, MD        Allergies as of 03/06/2024 - Review Complete 03/06/2024  Allergen Reaction Noted   Wellbutrin [bupropion] Other (See Comments) 05/13/2017    Family History  Problem Relation Age of Onset   Hypertension Mother    Uterine cancer Mother    Hypertension Father    Stomach cancer Neg Hx    Rectal cancer Neg Hx    Esophageal cancer Neg Hx    Colon polyps Neg Hx    Colon cancer Neg Hx     Social History   Socioeconomic History   Marital status: Married    Spouse name: Not on file   Number of children: Not on file   Years of education: Not on file   Highest education level: Not on file  Occupational History   Occupation: retired  Tobacco Use   Smoking status: Former    Current packs/day: 0.00    Average packs/day: 0.5 packs/day for 50.0 years (25.0 ttl pk-yrs)    Types: Cigars, Cigarettes    Start date: 04/14/1967    Quit date: 04/13/2017    Years since quitting: 6.9   Smokeless tobacco: Never   Tobacco comments:    Pt smokes intermittently  Vaping Use   Vaping status: Never Used  Substance and Sexual Activity   Alcohol use: Yes    Alcohol/week: 7.0 standard drinks of alcohol    Types: 7 Standard drinks or equivalent per week    Comment: [place]   Drug use: No   Sexual activity: Not on file  Other Topics Concern   Not on file  Social History Narrative   Drinks 2 cups of coffee in the morning.   Social Drivers of Corporate investment banker Strain: Not on file  Food Insecurity: No Food Insecurity (12/12/2023)   Hunger Vital Sign    Worried About Running Out of Food in the Last Year: Never true    Ran Out of Food in the Last Year: Never true  Transportation Needs: No Transportation Needs (12/12/2023)   PRAPARE - Scientist, research (physical sciences) (Medical): No    Lack of Transportation (Non-Medical): No  Physical Activity: Not on file  Stress: Not on file  Social Connections: Not on file  Intimate Partner Violence: Not At Risk (12/12/2023)   Humiliation, Afraid, Rape, and Kick questionnaire    Fear of Current or Ex-Partner: No    Emotionally Abused: No  Physically Abused: No    Sexually Abused: No    Review of Systems:  All other review of systems negative except as mentioned in the HPI.  Physical Exam: Vital signs BP 132/85   Pulse 85   Temp 98.6 F (37 C) (Temporal)   Ht 5\' 8"  (1.727 m)   Wt 160 lb (72.6 kg)   SpO2 96%   BMI 24.33 kg/m   General:   Alert,  Well-developed, well-nourished, pleasant and cooperative in NAD Airway:  Mallampati 2 Lungs:  Clear throughout to auscultation.   Heart:  Regular rate and rhythm; no murmurs, clicks, rubs,  or gallops. Abdomen:  Soft, nontender and nondistended. Normal bowel sounds.   Neuro/Psych:  Normal mood and affect. A and O x 3  Maren Beach, MD [facility]

## 2024-03-06 ENCOUNTER — Encounter: Payer: Self-pay | Admitting: Pediatrics

## 2024-03-06 ENCOUNTER — Ambulatory Visit: Payer: Medicare PPO | Admitting: Pediatrics

## 2024-03-06 VITALS — BP 135/80 | HR 83 | Temp 98.6°F | Resp 15 | Ht 68.0 in | Wt 160.0 lb

## 2024-03-06 DIAGNOSIS — Z860100 Personal history of colon polyps, unspecified: Secondary | ICD-10-CM | POA: Diagnosis not present

## 2024-03-06 DIAGNOSIS — K635 Polyp of colon: Secondary | ICD-10-CM | POA: Diagnosis not present

## 2024-03-06 DIAGNOSIS — D124 Benign neoplasm of descending colon: Secondary | ICD-10-CM | POA: Diagnosis not present

## 2024-03-06 DIAGNOSIS — D123 Benign neoplasm of transverse colon: Secondary | ICD-10-CM | POA: Diagnosis not present

## 2024-03-06 DIAGNOSIS — I10 Essential (primary) hypertension: Secondary | ICD-10-CM | POA: Diagnosis not present

## 2024-03-06 DIAGNOSIS — D128 Benign neoplasm of rectum: Secondary | ICD-10-CM

## 2024-03-06 DIAGNOSIS — K621 Rectal polyp: Secondary | ICD-10-CM

## 2024-03-06 DIAGNOSIS — D125 Benign neoplasm of sigmoid colon: Secondary | ICD-10-CM

## 2024-03-06 DIAGNOSIS — G4733 Obstructive sleep apnea (adult) (pediatric): Secondary | ICD-10-CM | POA: Diagnosis not present

## 2024-03-06 DIAGNOSIS — Z1211 Encounter for screening for malignant neoplasm of colon: Secondary | ICD-10-CM | POA: Diagnosis not present

## 2024-03-06 DIAGNOSIS — K648 Other hemorrhoids: Secondary | ICD-10-CM | POA: Diagnosis not present

## 2024-03-06 DIAGNOSIS — Z860101 Personal history of adenomatous and serrated colon polyps: Secondary | ICD-10-CM | POA: Diagnosis not present

## 2024-03-06 DIAGNOSIS — D127 Benign neoplasm of rectosigmoid junction: Secondary | ICD-10-CM | POA: Diagnosis not present

## 2024-03-06 MED ORDER — SODIUM CHLORIDE 0.9 % IV SOLN: 500.0000 mL | Freq: Once | INTRAVENOUS | Status: DC

## 2024-03-06 NOTE — Progress Notes (Signed)
 Called to room to assist during endoscopic procedure.  Patient ID and intended procedure confirmed with present staff. Received instructions for my participation in the procedure from the performing physician.

## 2024-03-06 NOTE — Progress Notes (Signed)
 Pt's states no medical or surgical changes since previsit or office visit.

## 2024-03-06 NOTE — Op Note (Signed)
 [facility] Patient Name: John Potts Procedure Date: 03/06/2024 10:03 AM MRN: [id number] Endoscopist: Maren Beach , MD, [id number] Age: 78 Referring MD:  Date of Birth: 1946/05/13 Gender: Male Account #: [id number] Procedure:                Colonoscopy Indications:              High risk colon cancer surveillance: Personal                            history of non-advanced adenoma, Last colonoscopy:                            2019 Medicines:                Monitored Anesthesia Care Procedure:                Pre-Anesthesia Assessment:                           - Prior to the procedure, a History and Physical                            was performed, and patient medications and                            allergies were reviewed. The patient's tolerance of                            previous anesthesia was also reviewed. The risks                            and benefits of the procedure and the sedation                            options and risks were discussed with the patient.                            All questions were answered, and informed consent                            was obtained. Prior Anticoagulants: The patient has                            taken no anticoagulant or antiplatelet agents. ASA                            Grade Assessment: II - A patient with mild systemic                            disease. After reviewing the risks and benefits,                            the patient was deemed in satisfactory condition to  undergo the procedure.                           After obtaining informed consent, the colonoscope                            was passed under direct vision. Throughout the                            procedure, the patient's blood pressure, pulse, and                            oxygen saturations were monitored continuously. The                            CF [id number] #[id number] was introduced through the anus                             and advanced to the cecum, identified by                            appendiceal orifice and ileocecal valve. The                            colonoscopy was performed without difficulty. The                            patient tolerated the procedure well. The quality                            of the bowel preparation was good. Scope In: 10:16:55 AM Scope Out: 10:31:39 AM Scope Withdrawal Time: 0 hours 11 minutes 8 seconds  Total Procedure Duration: 0 hours 14 minutes 44 seconds  Findings:                 Hemorrhoids were found on perianal exam.                           The digital rectal exam was normal. Pertinent                            negatives include normal sphincter tone and no                            palpable rectal lesions.                           Five sessile polyps were found in the rectum,                            sigmoid colon, descending colon and transverse                            colon. The polyps were 4 to 7 mm in size. These  polyps were removed with a cold snare. Resection                            and retrieval were complete.                           Internal hemorrhoids were found during retroflexion. Complications:            No immediate complications. Estimated blood loss:                            Minimal. Estimated Blood Loss:     Estimated blood loss was minimal. Impression:               - Hemorrhoids found on perianal exam.                           - Five 4 to 7 mm polyps in the rectum, in the                            sigmoid colon, in the descending colon and in the                            transverse colon, removed with a cold snare.                            Resected and retrieved.                           - Internal hemorrhoids. Recommendation:           - Discharge patient to home (ambulatory).                           - Await pathology results.                           - The findings  and recommendations were discussed                            with the patient's family.                           - Return to referring physician.                           - Patient has a contact number available for                            emergencies. The signs and symptoms of potential                            delayed complications were discussed with the                            patient. Return to normal activities tomorrow.  Written discharge instructions were provided to the                            patient. Maren Beach, MD 03/06/2024 10:37:07 AM This report has been signed electronically.

## 2024-03-06 NOTE — Progress Notes (Signed)
Sedate, gd SR, tolerated procedure well, VSS, report to RN Sedate, gd SR, tolerated procedure well, VSS, report to RN

## 2024-03-06 NOTE — Patient Instructions (Signed)

## 2024-03-08 ENCOUNTER — Telehealth: Payer: Self-pay | Admitting: *Deleted

## 2024-03-08 NOTE — Telephone Encounter (Signed)
  Follow up Call-     03/06/2024    9:36 AM  Call back number  Post procedure Call Back phone  # [phone]  Permission to leave phone message Yes     Patient questions:  Do you have a fever, pain , or abdominal swelling? No. Pain Score  0 *  Have you tolerated food without any problems? Yes.    Have you been able to return to your normal activities? Yes.    Do you have any questions about your discharge instructions: Diet   No. Medications  No. Follow up visit  No.  Do you have questions or concerns about your Care? No.  Actions: * If pain score is 4 or above: No action needed, pain <4.

## 2024-03-12 ENCOUNTER — Encounter: Payer: Self-pay | Admitting: Pediatrics

## 2024-03-12 LAB — SURGICAL PATHOLOGY

## 2024-05-07 DIAGNOSIS — C61 Malignant neoplasm of prostate: Secondary | ICD-10-CM | POA: Diagnosis not present

## 2024-05-14 DIAGNOSIS — C61 Malignant neoplasm of prostate: Secondary | ICD-10-CM | POA: Diagnosis not present

## 2024-05-14 DIAGNOSIS — N529 Male erectile dysfunction, unspecified: Secondary | ICD-10-CM | POA: Diagnosis not present

## 2024-05-15 ENCOUNTER — Other Ambulatory Visit (HOSPITAL_COMMUNITY): Payer: Self-pay | Admitting: Urology

## 2024-05-15 DIAGNOSIS — C61 Malignant neoplasm of prostate: Secondary | ICD-10-CM

## 2024-08-13 DIAGNOSIS — E785 Hyperlipidemia, unspecified: Secondary | ICD-10-CM | POA: Diagnosis not present

## 2024-08-13 DIAGNOSIS — R82998 Other abnormal findings in urine: Secondary | ICD-10-CM | POA: Diagnosis not present

## 2024-08-13 DIAGNOSIS — M109 Gout, unspecified: Secondary | ICD-10-CM | POA: Diagnosis not present

## 2024-08-13 DIAGNOSIS — H35033 Hypertensive retinopathy, bilateral: Secondary | ICD-10-CM | POA: Diagnosis not present

## 2024-08-13 DIAGNOSIS — I10 Essential (primary) hypertension: Secondary | ICD-10-CM | POA: Diagnosis not present

## 2024-08-13 DIAGNOSIS — H4321 Crystalline deposits in vitreous body, right eye: Secondary | ICD-10-CM | POA: Diagnosis not present

## 2024-08-13 DIAGNOSIS — H35373 Puckering of macula, bilateral: Secondary | ICD-10-CM | POA: Diagnosis not present

## 2024-08-13 DIAGNOSIS — R972 Elevated prostate specific antigen [PSA]: Secondary | ICD-10-CM | POA: Diagnosis not present

## 2024-08-31 DIAGNOSIS — K529 Noninfective gastroenteritis and colitis, unspecified: Secondary | ICD-10-CM | POA: Diagnosis not present

## 2024-08-31 DIAGNOSIS — Z1331 Encounter for screening for depression: Secondary | ICD-10-CM | POA: Diagnosis not present

## 2024-08-31 DIAGNOSIS — R972 Elevated prostate specific antigen [PSA]: Secondary | ICD-10-CM | POA: Diagnosis not present

## 2024-08-31 DIAGNOSIS — R7301 Impaired fasting glucose: Secondary | ICD-10-CM | POA: Diagnosis not present

## 2024-08-31 DIAGNOSIS — Z0000 Encounter for general adult medical examination without abnormal findings: Secondary | ICD-10-CM | POA: Diagnosis not present

## 2024-08-31 DIAGNOSIS — Z1339 Encounter for screening examination for other mental health and behavioral disorders: Secondary | ICD-10-CM | POA: Diagnosis not present

## 2024-08-31 DIAGNOSIS — G4733 Obstructive sleep apnea (adult) (pediatric): Secondary | ICD-10-CM | POA: Diagnosis not present

## 2024-08-31 DIAGNOSIS — E785 Hyperlipidemia, unspecified: Secondary | ICD-10-CM | POA: Diagnosis not present

## 2024-08-31 DIAGNOSIS — Z23 Encounter for immunization: Secondary | ICD-10-CM | POA: Diagnosis not present

## 2024-08-31 DIAGNOSIS — I10 Essential (primary) hypertension: Secondary | ICD-10-CM | POA: Diagnosis not present

## 2024-09-17 ENCOUNTER — Ambulatory Visit (HOSPITAL_COMMUNITY)
Admission: RE | Admit: 2024-09-17 | Discharge: 2024-09-17 | Disposition: A | Discharge status: Home / Self Care | Source: Ambulatory Visit | Attending: Urology | Admitting: Urology

## 2024-09-17 DIAGNOSIS — C61 Malignant neoplasm of prostate: Secondary | ICD-10-CM | POA: Diagnosis not present

## 2024-09-17 DIAGNOSIS — N400 Enlarged prostate without lower urinary tract symptoms: Secondary | ICD-10-CM | POA: Diagnosis not present

## 2024-09-17 MED ORDER — GADOBUTROL 1 MMOL/ML IV SOLN
7.5000 mL | Freq: Once | INTRAVENOUS | Status: AC | PRN
Start: 2024-09-17 — End: 2024-09-17
  Administered 2024-09-17: 7.5 mL via INTRAVENOUS

## 2024-09-27 DIAGNOSIS — C61 Malignant neoplasm of prostate: Secondary | ICD-10-CM | POA: Diagnosis not present

## 2024-10-04 DIAGNOSIS — N528 Other male erectile dysfunction: Secondary | ICD-10-CM | POA: Diagnosis not present

## 2024-10-04 DIAGNOSIS — C61 Malignant neoplasm of prostate: Secondary | ICD-10-CM | POA: Diagnosis not present

## 2024-10-04 DIAGNOSIS — R3912 Poor urinary stream: Secondary | ICD-10-CM | POA: Diagnosis not present

## 2024-10-04 DIAGNOSIS — R399 Unspecified symptoms and signs involving the genitourinary system: Secondary | ICD-10-CM | POA: Diagnosis not present

## 2024-10-16 DIAGNOSIS — G4733 Obstructive sleep apnea (adult) (pediatric): Secondary | ICD-10-CM | POA: Diagnosis not present

## 2024-10-17 DIAGNOSIS — R0981 Nasal congestion: Secondary | ICD-10-CM | POA: Diagnosis not present

## 2024-10-17 DIAGNOSIS — R059 Cough, unspecified: Secondary | ICD-10-CM | POA: Diagnosis not present

## 2024-10-17 DIAGNOSIS — B349 Viral infection, unspecified: Secondary | ICD-10-CM | POA: Diagnosis not present

## 2024-10-17 DIAGNOSIS — Z03818 Encounter for observation for suspected exposure to other biological agents ruled out: Secondary | ICD-10-CM | POA: Diagnosis not present

## 2024-10-22 DIAGNOSIS — J069 Acute upper respiratory infection, unspecified: Secondary | ICD-10-CM | POA: Diagnosis not present

## 2024-10-22 DIAGNOSIS — J40 Bronchitis, not specified as acute or chronic: Secondary | ICD-10-CM | POA: Diagnosis not present

## 2024-10-22 DIAGNOSIS — I10 Essential (primary) hypertension: Secondary | ICD-10-CM | POA: Diagnosis not present

## 2024-10-22 DIAGNOSIS — R051 Acute cough: Secondary | ICD-10-CM | POA: Diagnosis not present

## 2024-11-13 DIAGNOSIS — C61 Malignant neoplasm of prostate: Secondary | ICD-10-CM | POA: Diagnosis not present

## 2024-11-13 DIAGNOSIS — R972 Elevated prostate specific antigen [PSA]: Secondary | ICD-10-CM | POA: Diagnosis not present

## 2024-12-12 NOTE — Progress Notes (Signed)
 GU Location of Tumor / Histology: Prostate Ca  If Prostate Cancer, Gleason Score is (3 + 4) and PSA is (9.8 on 09/27/2024)  Kayla LELON Murray presented as referral from Dr. Morene MICAEL Salines [facility]) elevated PSA.  Biopsies      09/17/2024 Dr. Morene MICAEL Salines MR Prostate with/without Contrast CLINICAL DATA:  Small though a.m. Gleason 3+3=6 prostate adenocarcinoma involving cores of previous left region of interest # 1. Additional small volume Gleason 3+3=6 prostate adenocarcinoma at the left base.   IMPRESSION: 1. Small PI-RADS category 3 lesion of the left posteromedial peripheral zone at the junction of the mid gland and apex, corresponding to previous region of interest # 1. This currently measures 0.15 cc, previously 0.25 cc. Targeting data sent to [facility]. 2. Prostatomegaly and benign prostatic hypertrophy. 3. Nonvisualization of the left spermatic cord, query prior left orchectomy.   Past/Anticipated interventions by urology, if any:  Dr. Morene MICAEL Salines    Past/Anticipated interventions by medical oncology, if any: NA  Weight changes, if any: {:18581}  IPSS: SHIM:  Bowel/Bladder complaints, if any: {:18581}   Nausea/Vomiting, if any: {:18581}  Pain issues, if any:  {:18581}  SAFETY ISSUES: Prior radiation? {:18581} Pacemaker/ICD? {:18581} Possible current pregnancy?  Is the patient on methotrexate?   Current Complaints / other details:

## 2024-12-14 ENCOUNTER — Ambulatory Visit
Admission: RE | Admit: 2024-12-14 | Discharge: 2024-12-14 | Disposition: A | Discharge status: Home / Self Care | Source: Ambulatory Visit | Attending: Radiation Oncology | Admitting: Radiation Oncology

## 2024-12-14 ENCOUNTER — Encounter: Payer: Self-pay | Admitting: Radiation Oncology

## 2024-12-14 ENCOUNTER — Encounter: Payer: Self-pay | Admitting: Urology

## 2024-12-14 VITALS — BP 155/86 | HR 80 | Temp 97.9°F | Resp 20 | Ht 68.0 in | Wt 164.0 lb

## 2024-12-14 DIAGNOSIS — G4730 Sleep apnea, unspecified: Secondary | ICD-10-CM | POA: Insufficient documentation

## 2024-12-14 DIAGNOSIS — M109 Gout, unspecified: Secondary | ICD-10-CM | POA: Insufficient documentation

## 2024-12-14 DIAGNOSIS — M129 Arthropathy, unspecified: Secondary | ICD-10-CM | POA: Diagnosis not present

## 2024-12-14 DIAGNOSIS — Z87891 Personal history of nicotine dependence: Secondary | ICD-10-CM | POA: Insufficient documentation

## 2024-12-14 DIAGNOSIS — Z808 Family history of malignant neoplasm of other organs or systems: Secondary | ICD-10-CM | POA: Insufficient documentation

## 2024-12-14 DIAGNOSIS — Z87442 Personal history of urinary calculi: Secondary | ICD-10-CM | POA: Diagnosis not present

## 2024-12-14 DIAGNOSIS — E785 Hyperlipidemia, unspecified: Secondary | ICD-10-CM | POA: Diagnosis not present

## 2024-12-14 DIAGNOSIS — C61 Malignant neoplasm of prostate: Secondary | ICD-10-CM

## 2024-12-14 DIAGNOSIS — Z79899 Other long term (current) drug therapy: Secondary | ICD-10-CM | POA: Insufficient documentation

## 2024-12-14 DIAGNOSIS — I10 Essential (primary) hypertension: Secondary | ICD-10-CM | POA: Insufficient documentation

## 2024-12-14 HISTORY — DX: Malignant neoplasm of prostate: C61

## 2024-12-14 NOTE — Progress Notes (Signed)
 Introduced myself to the patient, and his wife, as the prostate nurse navigator. He is here to discuss his radiation treatment options and will proceed with 5.5 weeks of IMRT.  I provided patient with some written education and my direct contact information.  Patient knows to reach out with any questions or barriers that may arise.

## 2024-12-14 NOTE — Progress Notes (Signed)
 " Radiation Oncology         [phone] ________________________________  Initial Outpatient Consultation  Name: John Potts MRN: [id number]  Date: 12/14/2024  DOB: 07-18-1946  RR:Ejuzmdnw, Toribio MATSU, MD  Cam Morene LELON, MD   REFERRING PHYSICIAN: Cam Morene LELON, MD  DIAGNOSIS: 78 y.o. gentleman with Stage T1c adenocarcinoma of the prostate with Gleason score of 3+4, and PSA of 9.2.    ICD-10-CM   1. Malignant neoplasm of prostate (HCC)  C61       HISTORY OF PRESENT ILLNESS: John Potts is a 78 y.o. male with a diagnosis of prostate cancer. He has been followed by Dr. Cam in urology for BPH with LUTS and an elevated PSA dating back to 2018. His PSA ahd fluctuated in the 4-range through 2021 before rising to 6.5 in 2023 and 7.6 in 06/2023. A prostate MRI performed on 09/17/23 showed a small PI-RADS 4 lesion in the left posterolateral peripheral zone. He was subsequently diagnosed with very low volume Gleason 3+3 prostate cancer involving 4 of 15 cores on fusion biopsy performed 10/31/23. He was appropriately placed on active surveillance at that time. His PSA remained stable elevated at 6.89 in 04/2024 but increased up to 9.2 in 09/27/2024. A surveillance prostate MRI was performed on 09/17/24 showing a small PI-RADS 3 lesion in the left posteromedial peripheral zone at the junction of the mid gland and apex, corresponding to previous ROI. The patient proceeded to repeat MRI fusion biopsy of the prostate on 11/13/24.  The prostate volume measured 95 cc.  Out of 13 core biopsies, 2 were positive.  The maximum Gleason score was 3+4, and this was seen in the MRI ROI and in the left base lateral.  He is accompanied by his wife, John Potts, for today's visit.  The patient reviewed the biopsy results with his urologist and he has kindly been referred today for discussion of potential radiation treatment options. A Decipher test was sent out earlier this week but results are still pending at  this time.   PREVIOUS RADIATION THERAPY: No  PAST MEDICAL HISTORY:  Past Medical History:  Diagnosis Date   Allergy     Arthritis    Cancer (HCC)    Cataract    Cold intolerance 06/20/2019   Complication of anesthesia    Difficult urinating   Dysrhythmia    Elevated PSA 07/23/2019   Erectile dysfunction    Gout    Hyperlipidemia    Hypertension    Kidney stone    Knee pain    Lung nodules 06/20/2019   OSA on CPAP    PAC (premature atrial contraction)    after COVID shot   Prostate cancer (HCC)    Sleep apnea    uses cpap   Weight loss 06/20/2019   Wrist pain 06/20/2019      PAST SURGICAL HISTORY: Past Surgical History:  Procedure Laterality Date   CATARACT EXTRACTION, BILATERAL     LIPOMA EXCISION     MOHS SURGERY     PROSTATE BIOPSY     TONSILLECTOMY     TOTAL HIP ARTHROPLASTY Right 02/22/2018   Procedure: RIGHT TOTAL HIP ARTHROPLASTY ANTERIOR APPROACH;  Surgeon: Melodi Lerner, MD;  Location: [facility];  Service: Orthopedics;  Laterality: Right;   TOTAL HIP ARTHROPLASTY Left 12/12/2023   Procedure: TOTAL HIP ARTHROPLASTY ANTERIOR APPROACH;  Surgeon: Melodi Lerner, MD;  Location: [facility];  Service: Orthopedics;  Laterality: Left;    FAMILY HISTORY:  Family History  Problem Relation  Age of Onset   Hypertension Mother    Uterine cancer Mother    Hypertension Father    Stomach cancer Neg Hx    Rectal cancer Neg Hx    Esophageal cancer Neg Hx    Colon polyps Neg Hx    Colon cancer Neg Hx     SOCIAL HISTORY:  Social History   Socioeconomic History   Marital status: Married    Spouse name: Not on file   Number of children: Not on file   Years of education: Not on file   Highest education level: Not on file  Occupational History   Occupation: retired  Tobacco Use   Smoking status: Former    Current packs/day: 0.00    Average packs/day: 0.5 packs/day for 50.0 years (25.0 ttl pk-yrs)    Types: Cigars, Cigarettes    Start date: 04/14/1967    Quit date:  04/13/2017    Years since quitting: 7.6   Smokeless tobacco: Never   Tobacco comments:    Pt smokes intermittently  Vaping Use   Vaping status: Never Used  Substance and Sexual Activity   Alcohol  use: Yes    Alcohol /week: 7.0 standard drinks of alcohol     Types: 7 Standard drinks or equivalent per week    Comment: [place]   Drug use: No   Sexual activity: Not on file  Other Topics Concern   Not on file  Social History Narrative   Drinks 2 cups of coffee in the morning.   Social Drivers of Health   Tobacco Use: Medium Risk (12/14/2024)   Patient History    Smoking Tobacco Use: Former    Smokeless Tobacco Use: Never    Passive Exposure: Not on Actuary Strain: Not on file  Food Insecurity: No Food Insecurity (12/14/2024)   Epic    Worried About Programme Researcher, Broadcasting/film/video in the Last Year: Never true    Ran Out of Food in the Last Year: Never true  Transportation Needs: No Transportation Needs (12/12/2023)   PRAPARE - Administrator, Civil Service (Medical): No    Lack of Transportation (Non-Medical): No  Physical Activity: Not on file  Stress: Not on file  Social Connections: Not on file  Intimate Partner Violence: Not At Risk (12/14/2024)   Epic    Fear of Current or Ex-Partner: No    Emotionally Abused: No    Physically Abused: No    Sexually Abused: No  Depression (PHQ2-9): Low Risk (12/14/2024)   Depression (PHQ2-9)    PHQ-2 Score: 0  Alcohol  Screen: Low Risk (12/14/2024)   Alcohol  Screen    Last Alcohol  Screening Score (AUDIT): 3  Housing: Low Risk (12/14/2024)   Epic    Unable to Pay for Housing in the Last Year: No    Number of Times Moved in the Last Year: 0    Homeless in the Last Year: No  Utilities: Not At Risk (12/14/2024)   Epic    Threatened with loss of utilities: No  Health Literacy: Not on file    ALLERGIES: Wellbutrin [bupropion]  MEDICATIONS:  Current Outpatient Medications  Medication Sig Dispense Refill    scopolamine (TRANSDERM-SCOP) 1 MG/3DAYS Place 1 patch onto the skin every 3 (three) days.     allopurinol  (ZYLOPRIM ) 300 MG tablet Take 300 mg by mouth daily.     amLODipine  (NORVASC ) 5 MG tablet Take 5 mg by mouth daily.     famotidine -calcium carbonate-magnesium  hydroxide (PEPCID  COMPLETE) 10-800-165 MG  chewable tablet Chew 1 tablet by mouth daily as needed (heartburn).     ipratropium (ATROVENT ) 0.06 % nasal spray Place 2 sprays into both nostrils daily.     Melatonin 5 MG CAPS Take 5 mg by mouth at bedtime.     metoprolol  succinate (TOPROL -XL) 25 MG 24 hr tablet Take 25 mg by mouth daily.     Multiple Vitamin (MULTIVITAMIN) capsule Take 1 capsule by mouth daily.     pravastatin  (PRAVACHOL ) 20 MG tablet Take 20 mg by mouth daily.     tadalafil (CIALIS) 20 MG tablet Take 20 mg by mouth daily as needed for erectile dysfunction.     Tamsulosin  HCl (FLOMAX ) 0.4 MG CAPS Take 0.4 mg by mouth daily.      No current facility-administered medications for this encounter.    REVIEW OF SYSTEMS:  On review of systems, the patient reports that he is doing well overall. He denies any chest pain, shortness of breath, cough, fevers, chills, night sweats, unintended weight changes. He denies any bowel disturbances, and denies abdominal pain, nausea or vomiting. He denies any new musculoskeletal or joint aches or pains. His IPSS was 13, indicating mild-moderate urinary symptoms. His SHIM was 9, indicating he has moderate erectile dysfunction. A complete review of systems is obtained and is otherwise negative.    PHYSICAL EXAM:  Wt Readings from Last 3 Encounters:  12/14/24 164 lb (74.4 kg)  03/06/24 160 lb (72.6 kg)  02/23/24 160 lb (72.6 kg)   Temp Readings from Last 3 Encounters:  12/14/24 97.9 F (36.6 C)  03/06/24 98.6 F (37 C) (Temporal)  12/13/23 (!) 97.3 F (36.3 C) (Oral)   BP Readings from Last 3 Encounters:  12/14/24 (!) 155/86  03/06/24 135/80  01/26/24 138/79   Pulse Readings from  Last 3 Encounters:  12/14/24 80  03/06/24 83  01/26/24 86   Pain Assessment Pain Score: 0-No pain/10  In general this is a well appearing Caucasian male in no acute distress. He's alert and oriented x4 and appropriate throughout the examination. Cardiopulmonary assessment is negative for acute distress, and he exhibits normal effort.     KPS = 100  100 - Normal; no complaints; no evidence of disease. 90   - Able to carry on normal activity; minor signs or symptoms of disease. 80   - Normal activity with effort; some signs or symptoms of disease. 4   - Cares for self; unable to carry on normal activity or to do active work. 60   - Requires occasional assistance, but is able to care for most of his personal needs. 50   - Requires considerable assistance and frequent medical care. 40   - Disabled; requires special care and assistance. 30   - Severely disabled; hospital admission is indicated although death not imminent. 20   - Very sick; hospital admission necessary; active supportive treatment necessary. 10   - Moribund; fatal processes progressing rapidly. 0     - Dead  Karnofsky DA, Abelmann WH, Craver LS and Burchenal [facility] [phone]) The use of the nitrogen mustards in the palliative treatment of carcinoma: with particular reference to bronchogenic carcinoma Cancer 1 634-56  LABORATORY DATA:  Lab Results  Component Value Date   WBC 15.0 (H) 12/13/2023   HGB 13.4 12/13/2023   HCT 41.8 12/13/2023   MCV 93.7 12/13/2023   PLT 204 12/13/2023   Lab Results  Component Value Date   NA 137 12/13/2023   K 4.1 12/13/2023   CL  107 12/13/2023   CO2 19 (L) 12/13/2023   Lab Results  Component Value Date   ALT 22 06/20/2019   AST 19 06/20/2019   ALKPHOS 74 02/17/2018   BILITOT 0.5 06/20/2019     RADIOGRAPHY: No results found.    IMPRESSION/PLAN: 1. 78 y.o. gentleman with Stage T1c adenocarcinoma of the prostate with Gleason Score of 3+4, and PSA of 9.2. We discussed the patient's  workup and outlined the nature of prostate cancer in this setting. The patient's T stage, Gleason's score, and PSA put him into the favorable intermediate risk group. Accordingly, he is eligible for a variety of potential treatment options including continuing active surveillance, brachytherapy, 5.5 weeks of external radiation, or prostatectomy. We discussed the available radiation techniques, and focused on the details and logistics of delivery. The patient is not an ideal candidate for brachytherapy with a prostate volume of 95 g and is not an ideal surgical candidate given his advanced age and increased risk for incontinence. Therefore, we discussed and outlined the risks, benefits, short and long-term effects associated with radiotherapy and compared and contrasted these with prostatectomy. We discussed the role of SpaceOAR gel in reducing the rectal toxicity associated with radiotherapy. He was encouraged to ask questions that were answered to his stated satisfaction.  At the conclusion of our conversation, the patient is interested in moving forward with 5.5 weeks of external beam therapy. We will share our discussion with Dr. Cam and make arrangements for fiducial markers and SpaceOAR gel placement, first available, prior to simulation, to reduce rectal toxicity from radiotherapy. The patient appears to have a good understanding of his disease and our treatment recommendations which are of curative intent and is in agreement with the stated plan. Therefore, we will move forward with treatment planning accordingly, in anticipation of beginning IMRT in the near future.  We enjoyed meeting him and his wife, John Potts, today and look forward to continuing to participate in his care.  We personally spent 70 minutes in this encounter including chart review, reviewing radiological studies, meeting face-to-face with the patient, entering orders and completing documentation.    Sabra MICAEL Rusk, PA-C     Donnice Barge, MD  [facility]  Radiation Oncology Direct Dial: [phone]  Fax: [phone] [email or web address]  Skype  [email or web address]   This document serves as a record of services personally performed by Donnice Barge, MD and Sabra Rusk, PA-C. It was created on their behalf by Izetta Neither, a trained medical scribe. The creation of this record is based on the scribe's personal observations and the provider's statements to them. This document has been checked and approved by the attending provider.    "

## 2024-12-24 ENCOUNTER — Other Ambulatory Visit: Payer: Self-pay | Admitting: Urology

## 2024-12-25 ENCOUNTER — Other Ambulatory Visit: Payer: Self-pay | Admitting: Urology

## 2024-12-25 DIAGNOSIS — C61 Malignant neoplasm of prostate: Secondary | ICD-10-CM

## 2025-01-15 NOTE — Progress Notes (Signed)
 RN spoke with patient and reviewed upcoming fiducial marker's, and spaceOAR gel placement, followed by CT Simulation.  All questions answered.  No additional needs at this time. Patient will call if any additional questions arise.

## 2025-01-16 ENCOUNTER — Encounter (HOSPITAL_COMMUNITY): Payer: Self-pay | Admitting: Urology

## 2025-01-16 ENCOUNTER — Other Ambulatory Visit: Payer: Self-pay

## 2025-01-16 NOTE — Progress Notes (Signed)
 PCP - Dr.Daniel Jakie PPM/ICD - n/a EKG - 11/29/23 Stress Test -05/11/2001   Sleep Study - yes CPAP - using at night  None diabetic Blood Thinner Instructions:n/a Aspirin  Instructions:n/a  NPO after [place] COVID TEST-n/a  Patient denies shortness of breath, fever, cough and chest pain at pre-op call appointment All instructions explained to the patient, with a verbal understanding.

## 2025-01-18 ENCOUNTER — Encounter (HOSPITAL_COMMUNITY): Payer: Self-pay | Admitting: Urology

## 2025-01-18 ENCOUNTER — Ambulatory Visit (HOSPITAL_COMMUNITY): Admitting: Anesthesiology

## 2025-01-18 ENCOUNTER — Telehealth: Payer: Self-pay | Admitting: *Deleted

## 2025-01-18 ENCOUNTER — Encounter (HOSPITAL_COMMUNITY)
Admission: RE | Disposition: A | Discharge status: Home / Self Care | Payer: Self-pay | Source: Home / Self Care | Attending: Urology

## 2025-01-18 ENCOUNTER — Ambulatory Visit (HOSPITAL_COMMUNITY)
Admission: RE | Admit: 2025-01-18 | Discharge: 2025-01-18 | Disposition: A | Discharge status: Home / Self Care | Attending: Urology | Admitting: Urology

## 2025-01-18 DIAGNOSIS — C61 Malignant neoplasm of prostate: Secondary | ICD-10-CM | POA: Diagnosis present

## 2025-01-18 DIAGNOSIS — G4730 Sleep apnea, unspecified: Secondary | ICD-10-CM | POA: Insufficient documentation

## 2025-01-18 DIAGNOSIS — I10 Essential (primary) hypertension: Secondary | ICD-10-CM | POA: Diagnosis not present

## 2025-01-18 DIAGNOSIS — Z01818 Encounter for other preprocedural examination: Secondary | ICD-10-CM

## 2025-01-18 DIAGNOSIS — Z87891 Personal history of nicotine dependence: Secondary | ICD-10-CM | POA: Insufficient documentation

## 2025-01-18 DIAGNOSIS — M1990 Unspecified osteoarthritis, unspecified site: Secondary | ICD-10-CM | POA: Insufficient documentation

## 2025-01-18 DIAGNOSIS — Z79899 Other long term (current) drug therapy: Secondary | ICD-10-CM | POA: Diagnosis not present

## 2025-01-18 DIAGNOSIS — J449 Chronic obstructive pulmonary disease, unspecified: Secondary | ICD-10-CM

## 2025-01-18 DIAGNOSIS — K219 Gastro-esophageal reflux disease without esophagitis: Secondary | ICD-10-CM | POA: Insufficient documentation

## 2025-01-18 HISTORY — DX: Gastro-esophageal reflux disease without esophagitis: K21.9

## 2025-01-18 MED ORDER — ACETAMINOPHEN 10 MG/ML IV SOLN
INTRAVENOUS | Status: AC
  Filled 2025-01-18: qty 100

## 2025-01-18 MED ORDER — CIPROFLOXACIN IN D5W 400 MG/200ML IV SOLN
400.0000 mg | INTRAVENOUS | Status: AC
  Administered 2025-01-18: 400 mg via INTRAVENOUS

## 2025-01-18 MED ORDER — DEXAMETHASONE SOD PHOSPHATE PF 10 MG/ML IJ SOLN
INTRAMUSCULAR | Status: DC | PRN
  Administered 2025-01-18: 5 mg via INTRAVENOUS

## 2025-01-18 MED ORDER — CHLORHEXIDINE GLUCONATE 0.12 % MT SOLN: 15.0000 mL | Freq: Once | OROMUCOSAL | Status: AC

## 2025-01-18 MED ORDER — ONDANSETRON HCL 4 MG/2ML IJ SOLN
INTRAMUSCULAR | Status: DC | PRN
  Administered 2025-01-18: 4 mg via INTRAVENOUS

## 2025-01-18 MED ORDER — ACETAMINOPHEN 10 MG/ML IV SOLN
1000.0000 mg | Freq: Once | INTRAVENOUS | Status: DC | PRN
  Administered 2025-01-18: 1000 mg via INTRAVENOUS

## 2025-01-18 MED ORDER — PROPOFOL 500 MG/50ML IV EMUL
INTRAVENOUS | Status: DC | PRN
  Administered 2025-01-18: 125 ug/kg/min via INTRAVENOUS

## 2025-01-18 MED ORDER — SODIUM CHLORIDE (PF) 0.9 % IJ SOLN
INTRAMUSCULAR | Status: DC | PRN
  Administered 2025-01-18: 10 mL

## 2025-01-18 MED ORDER — OXYCODONE HCL 5 MG PO TABS
ORAL_TABLET | ORAL | Status: AC
  Filled 2025-01-18: qty 1

## 2025-01-18 MED ORDER — OXYCODONE HCL 5 MG PO TABS
5.0000 mg | ORAL_TABLET | Freq: Once | ORAL | Status: AC
  Administered 2025-01-18: 5 mg via ORAL

## 2025-01-18 MED ORDER — CIPROFLOXACIN IN D5W 400 MG/200ML IV SOLN
INTRAVENOUS | Status: AC
  Filled 2025-01-18: qty 200

## 2025-01-18 MED ORDER — CHLORHEXIDINE GLUCONATE 0.12 % MT SOLN
OROMUCOSAL | Status: AC
  Administered 2025-01-18: 15 mL via OROMUCOSAL
  Filled 2025-01-18: qty 15

## 2025-01-18 MED ORDER — FLEET ENEMA RE ENEM: 1.0000 | ENEMA | Freq: Once | RECTAL | Status: DC

## 2025-01-18 MED ORDER — FENTANYL CITRATE (PF) 100 MCG/2ML IJ SOLN
INTRAMUSCULAR | Status: DC | PRN
  Administered 2025-01-18: 25 ug via INTRAVENOUS
  Administered 2025-01-18: 50 ug via INTRAVENOUS

## 2025-01-18 MED ORDER — LIDOCAINE 2% (20 MG/ML) 5 ML SYRINGE
INTRAMUSCULAR | Status: DC | PRN
  Administered 2025-01-18: 40 mg via INTRAVENOUS

## 2025-01-18 MED ORDER — LIDOCAINE HCL 1 % IJ SOLN
INTRAMUSCULAR | Status: DC | PRN
  Administered 2025-01-18: 10 mL

## 2025-01-18 MED ORDER — PROPOFOL 10 MG/ML IV BOLUS
INTRAVENOUS | Status: DC | PRN
  Administered 2025-01-18: 30 mg via INTRAVENOUS

## 2025-01-18 MED ORDER — FENTANYL CITRATE (PF) 100 MCG/2ML IJ SOLN
INTRAMUSCULAR | Status: AC
  Filled 2025-01-18: qty 2

## 2025-01-18 MED ORDER — LACTATED RINGERS IV SOLN: INTRAVENOUS | Status: DC

## 2025-01-18 MED ORDER — DROPERIDOL 2.5 MG/ML IJ SOLN: 0.6250 mg | Freq: Once | INTRAMUSCULAR | Status: DC | PRN

## 2025-01-18 MED ORDER — ORAL CARE MOUTH RINSE: 15.0000 mL | Freq: Once | OROMUCOSAL | Status: AC

## 2025-01-18 MED ORDER — FENTANYL CITRATE (PF) 100 MCG/2ML IJ SOLN: 25.0000 ug | INTRAMUSCULAR | Status: DC | PRN

## 2025-01-18 NOTE — Anesthesia Preprocedure Evaluation (Signed)
 "                                  Anesthesia Evaluation  Patient identified by MRN, date of birth, ID band Patient awake    Reviewed: Allergy  & Precautions, NPO status , Patient's Chart, lab work & pertinent test results  Airway Mallampati: II  TM Distance: >3 FB Neck ROM: Full    Dental no notable dental hx.    Pulmonary sleep apnea , COPD, former smoker   Pulmonary exam normal        Cardiovascular hypertension, Pt. on medications and Pt. on home beta blockers + dysrhythmias  Rhythm:Regular Rate:Normal     Neuro/Psych negative neurological ROS  negative psych ROS   GI/Hepatic Neg liver ROS,GERD  ,,  Endo/Other  negative endocrine ROS    Renal/GU   negative genitourinary   Musculoskeletal  (+) Arthritis , Osteoarthritis,    Abdominal Normal abdominal exam  (+)   Peds  Hematology Lab Results      Component                Value               Date                      WBC                      15.0 (H)            12/13/2023                HGB                      13.4                12/13/2023                HCT                      41.8                12/13/2023                MCV                      93.7                12/13/2023                PLT                      204                 12/13/2023              Anesthesia Other Findings   Reproductive/Obstetrics                              Anesthesia Physical Anesthesia Plan  ASA: 3  Anesthesia Plan: MAC   Post-op Pain Management:    Induction: Intravenous  PONV Risk Score and Plan: 1 and Ondansetron , Dexamethasone , Propofol  infusion and Treatment may vary due to age or medical condition  Airway Management Planned: Simple Face Mask and Nasal Cannula  Additional Equipment: None  Intra-op  Plan:   Post-operative Plan:   Informed Consent: I have reviewed the patients History and Physical, chart, labs and discussed the procedure including the risks, benefits  and alternatives for the proposed anesthesia with the patient or authorized representative who has indicated his/her understanding and acceptance.     Dental advisory given  Plan Discussed with: CRNA  Anesthesia Plan Comments:         Anesthesia Quick Evaluation  "

## 2025-01-18 NOTE — Telephone Encounter (Signed)
 Called patient to remind  of sim and MRI for 01-21-25, lvm for a return call

## 2025-01-18 NOTE — Interval H&P Note (Signed)
 History and Physical Interval Note:  01/18/2025 10:12 AM  John Potts  has presented today for surgery, with the diagnosis of PROSTATE CANCER.  The various methods of treatment have been discussed with the patient and family. After consideration of risks, benefits and other options for treatment, the patient has consented to  Procedures with comments: INSERTION, GOLD SEEDS (N/A) - GOLD SEED IMPLANTS AND SPACEOAR INSTILLATION INJECTION, HYDROGEL SPACER (N/A) as a surgical intervention.  The patient's history has been reviewed, patient examined, no change in status, stable for surgery.  I have reviewed the patient's chart and labs.  Questions were answered to the patient's satisfaction.     Morene LELON Salines

## 2025-01-18 NOTE — Anesthesia Postprocedure Evaluation (Signed)
"   Anesthesia Post Note  Patient: John Potts  Procedure(s) Performed: INSERTION, GOLD SEEDS (Prostate) INJECTION, HYDROGEL SPACER (Perineum)     Patient location during evaluation: PACU Anesthesia Type: MAC Level of consciousness: awake and alert Pain management: pain level controlled Vital Signs Assessment: post-procedure vital signs reviewed and stable Respiratory status: spontaneous breathing, nonlabored ventilation, respiratory function stable and patient connected to nasal cannula oxygen Cardiovascular status: stable and blood pressure returned to baseline Postop Assessment: no apparent nausea or vomiting Anesthetic complications: no   There were no known notable events for this encounter.  Last Vitals:  Vitals:   01/18/25 1315 01/18/25 1330  BP: (!) 147/86 (!) 156/90  Pulse: 71 76  Resp: 15 15  Temp:  (!) 36.1 C  SpO2: 95% 96%    Last Pain:  Vitals:   01/18/25 1315  TempSrc:   PainSc: 2                  Cordella SQUIBB [name]      "

## 2025-01-18 NOTE — Transfer of Care (Signed)
 Immediate Anesthesia Transfer of Care Note  Patient: John Potts  Procedure(s) Performed: INSERTION, GOLD SEEDS (Prostate) INJECTION, HYDROGEL SPACER (Perineum)  Patient Location: PACU  Anesthesia Type:MAC  Level of Consciousness: awake  Airway & Oxygen Therapy: Patient Spontanous Breathing  Post-op Assessment: Report given to RN  Post vital signs: stable  Last Vitals:  Vitals Value Taken Time  BP 124/73 01/18/25 12:36  Temp    Pulse 76 01/18/25 12:36  Resp 19 01/18/25 12:36  SpO2 95 % 01/18/25 12:36  Vitals shown include unfiled device data.  Last Pain:  Vitals:   01/18/25 0952  TempSrc: Oral  PainSc: 0-No pain      Patients Stated Pain Goal: 6 (01/18/25 9047)  Complications: There were no known notable events for this encounter.

## 2025-01-18 NOTE — Discharge Instructions (Signed)
For several days the patient: ? ?should increase his fluid intake and limit strenuous activity. ?he might have mild discomfort at the base of his penis or in his rectum. ?he might have blood in his urine or blood in his bowel movements. ? ?For 2-3 months he might have blood in his ejaculate (semen). ? ?Call the office immedicately: ? for blood clots in the urine or bowel movements,  ?difficulty urinating,  ?inability to urinate,  ?urinary retention,  ?painful or frequent urination,  ?fever, chills,  ?nausea, vomiting, ?other illness.  ? ? ?[facility]:  [phone] ? ?

## 2025-01-18 NOTE — Op Note (Signed)
 Preoperative diagnosis: Clinically localized adenocarcinoma of the prostate   Postoperative diagnosis: Clinically localized adenocarcinoma of the prostate  Procedure: 1) Placement of fiducial markers into prostate                    2) Insertion of SpaceOAR hydrogel   Surgeon: Morene Salines, M.D.  Anesthesia: General  EBL: Minimal  Complications: None  Indication: John Potts is a 79 y.o. gentleman with clinically localized prostate cancer. After discussing management options for treatment, he elected to proceed with radiotherapy. He presents today for the above procedures. The potential risks, complications, alternative options, and expected recovery course have been discussed in detail with the patient and he has provided informed consent to proceed.  Description of procedure: The patient was administered preoperative antibiotics, placed in the dorsal lithotomy position, and prepped and draped in the usual sterile fashion. Next, transrectal ultrasonography was utilized to visualize the prostate. Three gold fiducial markers were then placed into the prostate via transperineal needles under ultrasound guidance at the right apex, right base, and left mid gland under direct ultrasound guidance. A site in the midline was then selected on the perineum for placement of an 18 g needle with saline. The needle was advanced above the rectum and below Denonvillier's fascia to the mid gland and confirmed to be in the midline on transverse imaging. One cc of saline was injected confirming appropriate expansion of this space. A total of 5 cc of saline was then injected to open the space further bilaterally. The saline syringe was then removed and the SpaceOAR hydrogel was injected with good distribution bilaterally. He tolerated the procedure well and without complications. He was given a voiding trial prior to discharge from the PACU.

## 2025-01-18 NOTE — Telephone Encounter (Signed)
 Called patient to inform that department will be closed Monday and appt. has been cancelled, spoke with patient and he is aware of this and is good with this

## 2025-01-21 ENCOUNTER — Ambulatory Visit (HOSPITAL_COMMUNITY)

## 2025-01-21 ENCOUNTER — Ambulatory Visit: Admitting: Radiation Oncology

## 2025-01-21 ENCOUNTER — Encounter (HOSPITAL_COMMUNITY): Payer: Self-pay | Admitting: Urology

## 2025-01-24 ENCOUNTER — Ambulatory Visit: Payer: Medicare PPO | Admitting: Family Medicine

## 2025-01-24 ENCOUNTER — Other Ambulatory Visit: Payer: Self-pay | Admitting: Urology

## 2025-01-24 DIAGNOSIS — C61 Malignant neoplasm of prostate: Secondary | ICD-10-CM

## 2025-01-25 ENCOUNTER — Telehealth: Payer: Self-pay | Admitting: *Deleted

## 2025-01-25 NOTE — Telephone Encounter (Signed)
 Called patient to inform of sim and MRI appt., patient to report to [facility] on 02/05/25 @ 2:15 pm with a  full bladder, patient to report to [facility] right after sim for MRI on 02/05/25, spoke with patient and he is aware of these appts. and the instructions

## 2025-01-31 ENCOUNTER — Telehealth: Payer: Self-pay | Admitting: Radiation Oncology

## 2025-01-31 NOTE — Telephone Encounter (Signed)
 Pt called due to confusion with appts. Pt was under impression 2/9 appt was cx and new date of SIM was 2/10. Contacted Support RTT who was able to confirm via ARIA that 2/9 is cx and 2/10 is correct date for [facility]. Information shared with pt who verbalized understanding.

## 2025-02-04 ENCOUNTER — Ambulatory Visit

## 2025-02-05 ENCOUNTER — Ambulatory Visit (HOSPITAL_COMMUNITY)

## 2025-02-05 ENCOUNTER — Ambulatory Visit: Admitting: Radiation Oncology

## 2025-02-05 ENCOUNTER — Ambulatory Visit

## 2025-02-06 ENCOUNTER — Ambulatory Visit

## 2025-02-07 ENCOUNTER — Ambulatory Visit

## 2025-02-08 ENCOUNTER — Ambulatory Visit

## 2025-02-11 ENCOUNTER — Ambulatory Visit

## 2025-02-12 ENCOUNTER — Ambulatory Visit

## 2025-02-13 ENCOUNTER — Ambulatory Visit

## 2025-02-14 ENCOUNTER — Ambulatory Visit

## 2025-02-15 ENCOUNTER — Ambulatory Visit

## 2025-02-18 ENCOUNTER — Ambulatory Visit

## 2025-02-19 ENCOUNTER — Ambulatory Visit

## 2025-02-20 ENCOUNTER — Ambulatory Visit

## 2025-02-21 ENCOUNTER — Ambulatory Visit

## 2025-02-21 ENCOUNTER — Ambulatory Visit: Admitting: Neurology

## 2025-02-22 ENCOUNTER — Ambulatory Visit

## 2025-02-25 ENCOUNTER — Ambulatory Visit

## 2025-02-26 ENCOUNTER — Ambulatory Visit

## 2025-02-27 ENCOUNTER — Ambulatory Visit

## 2025-02-28 ENCOUNTER — Ambulatory Visit

## 2025-03-01 ENCOUNTER — Ambulatory Visit

## 2025-03-02 ENCOUNTER — Ambulatory Visit

## 2025-03-04 ENCOUNTER — Ambulatory Visit

## 2025-03-05 ENCOUNTER — Ambulatory Visit

## 2025-03-06 ENCOUNTER — Ambulatory Visit

## 2025-03-07 ENCOUNTER — Ambulatory Visit

## 2025-03-08 ENCOUNTER — Ambulatory Visit

## 2025-03-09 ENCOUNTER — Ambulatory Visit

## 2025-03-11 ENCOUNTER — Ambulatory Visit

## 2025-03-12 ENCOUNTER — Ambulatory Visit

## 2025-03-13 ENCOUNTER — Ambulatory Visit

## 2025-03-14 ENCOUNTER — Ambulatory Visit

## 2025-03-15 ENCOUNTER — Ambulatory Visit

## 2025-03-18 ENCOUNTER — Ambulatory Visit

## 2025-03-19 ENCOUNTER — Ambulatory Visit

## 2025-03-20 ENCOUNTER — Ambulatory Visit

## 2025-03-21 ENCOUNTER — Ambulatory Visit

## 2025-03-22 ENCOUNTER — Ambulatory Visit

## 2025-03-25 ENCOUNTER — Ambulatory Visit

## 2025-03-25 ENCOUNTER — Ambulatory Visit: Admitting: Neurology

## 2025-03-26 ENCOUNTER — Ambulatory Visit

## 2025-03-27 ENCOUNTER — Ambulatory Visit
